# Patient Record
Sex: Female | Born: 1935 | Race: White | Hispanic: No | Marital: Married | Smoking: Never smoker
Health system: Southern US, Community
[De-identification: ages and names within clinical notes are randomized; demographics above are authoritative.]

## PROBLEM LIST (undated history)

## (undated) DIAGNOSIS — Z9289 Personal history of other medical treatment: Secondary | ICD-10-CM

## (undated) DIAGNOSIS — N907 Vulvar cyst: Secondary | ICD-10-CM

## (undated) DIAGNOSIS — M1990 Unspecified osteoarthritis, unspecified site: Secondary | ICD-10-CM

## (undated) DIAGNOSIS — I10 Essential (primary) hypertension: Secondary | ICD-10-CM

## (undated) DIAGNOSIS — K219 Gastro-esophageal reflux disease without esophagitis: Secondary | ICD-10-CM

## (undated) DIAGNOSIS — Q248 Other specified congenital malformations of heart: Secondary | ICD-10-CM

## (undated) DIAGNOSIS — I447 Left bundle-branch block, unspecified: Secondary | ICD-10-CM

## (undated) DIAGNOSIS — I2510 Atherosclerotic heart disease of native coronary artery without angina pectoris: Secondary | ICD-10-CM

## (undated) DIAGNOSIS — I340 Nonrheumatic mitral (valve) insufficiency: Secondary | ICD-10-CM

## (undated) DIAGNOSIS — E785 Hyperlipidemia, unspecified: Secondary | ICD-10-CM

## (undated) DIAGNOSIS — M8580 Other specified disorders of bone density and structure, unspecified site: Secondary | ICD-10-CM

## (undated) DIAGNOSIS — I509 Heart failure, unspecified: Secondary | ICD-10-CM

## (undated) DIAGNOSIS — R0989 Other specified symptoms and signs involving the circulatory and respiratory systems: Secondary | ICD-10-CM

## (undated) DIAGNOSIS — I739 Peripheral vascular disease, unspecified: Secondary | ICD-10-CM

## (undated) DIAGNOSIS — I517 Cardiomegaly: Secondary | ICD-10-CM

## (undated) DIAGNOSIS — Z9889 Other specified postprocedural states: Secondary | ICD-10-CM

## (undated) DIAGNOSIS — Z86010 Personal history of colonic polyps: Secondary | ICD-10-CM

## (undated) DIAGNOSIS — C4492 Squamous cell carcinoma of skin, unspecified: Secondary | ICD-10-CM

## (undated) DIAGNOSIS — I4891 Unspecified atrial fibrillation: Secondary | ICD-10-CM

## (undated) DIAGNOSIS — G2581 Restless legs syndrome: Secondary | ICD-10-CM

## (undated) DIAGNOSIS — M810 Age-related osteoporosis without current pathological fracture: Secondary | ICD-10-CM

## (undated) HISTORY — DX: Atherosclerotic heart disease of native coronary artery without angina pectoris: I25.10

## (undated) HISTORY — DX: Cardiomegaly: I51.7

## (undated) HISTORY — DX: Other specified congenital malformations of heart: Q24.8

## (undated) HISTORY — PX: GLAUCOMA SURGERY: SHX656

## (undated) HISTORY — DX: Peripheral vascular disease, unspecified: I73.9

## (undated) HISTORY — DX: Unspecified osteoarthritis, unspecified site: M19.90

## (undated) HISTORY — DX: Age-related osteoporosis without current pathological fracture: M81.0

## (undated) HISTORY — DX: Restless legs syndrome: G25.81

## (undated) HISTORY — PX: OTHER SURGICAL HISTORY: SHX169

## (undated) HISTORY — DX: Other specified postprocedural states: Z98.890

## (undated) HISTORY — DX: Personal history of other medical treatment: Z92.89

## (undated) HISTORY — DX: Unspecified atrial fibrillation: I48.91

## (undated) HISTORY — DX: Left bundle-branch block, unspecified: I44.7

## (undated) HISTORY — DX: Nonrheumatic mitral (valve) insufficiency: I34.0

## (undated) HISTORY — PX: CATARACT EXTRACTION: SUR2

## (undated) HISTORY — PX: PARS PLANA VITRECTOMY W/ REPAIR OF MACULAR HOLE: SHX2170

## (undated) HISTORY — DX: Vulvar cyst: N90.7

## (undated) HISTORY — DX: Gastro-esophageal reflux disease without esophagitis: K21.9

## (undated) HISTORY — DX: Hyperlipidemia, unspecified: E78.5

## (undated) HISTORY — DX: Personal history of colonic polyps: Z86.010

## (undated) HISTORY — DX: Other specified symptoms and signs involving the circulatory and respiratory systems: R09.89

## (undated) HISTORY — DX: Essential (primary) hypertension: I10

## (undated) HISTORY — DX: Squamous cell carcinoma of skin, unspecified: C44.92

## (undated) HISTORY — PX: TONSILLECTOMY: SUR1361

## (undated) HISTORY — DX: Other specified disorders of bone density and structure, unspecified site: M85.80

---

## 1997-06-11 ENCOUNTER — Other Ambulatory Visit: Admission: RE | Admit: 1997-06-11 | Discharge: 1997-06-11 | Payer: Self-pay | Admitting: *Deleted

## 1997-06-18 ENCOUNTER — Other Ambulatory Visit: Admission: RE | Admit: 1997-06-18 | Discharge: 1997-06-18 | Payer: Self-pay | Admitting: *Deleted

## 1998-07-05 ENCOUNTER — Other Ambulatory Visit: Admission: RE | Admit: 1998-07-05 | Discharge: 1998-07-05 | Payer: Self-pay | Admitting: *Deleted

## 1999-07-09 ENCOUNTER — Other Ambulatory Visit: Admission: RE | Admit: 1999-07-09 | Discharge: 1999-07-09 | Payer: Self-pay | Admitting: *Deleted

## 1999-07-16 ENCOUNTER — Encounter: Payer: Self-pay | Admitting: *Deleted

## 1999-07-16 ENCOUNTER — Encounter: Admission: RE | Admit: 1999-07-16 | Discharge: 1999-07-16 | Payer: Self-pay | Admitting: *Deleted

## 2000-06-11 ENCOUNTER — Other Ambulatory Visit: Admission: RE | Admit: 2000-06-11 | Discharge: 2000-06-11 | Payer: Self-pay | Admitting: Internal Medicine

## 2002-03-27 ENCOUNTER — Ambulatory Visit (HOSPITAL_COMMUNITY): Admission: RE | Admit: 2002-03-27 | Discharge: 2002-03-28 | Payer: Self-pay | Admitting: Ophthalmology

## 2002-04-03 ENCOUNTER — Ambulatory Visit (HOSPITAL_COMMUNITY): Admission: RE | Admit: 2002-04-03 | Discharge: 2002-04-04 | Payer: Self-pay | Admitting: Ophthalmology

## 2002-04-17 ENCOUNTER — Ambulatory Visit (HOSPITAL_COMMUNITY): Admission: RE | Admit: 2002-04-17 | Discharge: 2002-04-17 | Payer: Self-pay | Admitting: Ophthalmology

## 2003-06-29 ENCOUNTER — Other Ambulatory Visit: Admission: RE | Admit: 2003-06-29 | Discharge: 2003-06-29 | Payer: Self-pay | Admitting: Internal Medicine

## 2004-06-30 ENCOUNTER — Ambulatory Visit: Payer: Self-pay | Admitting: Internal Medicine

## 2004-06-30 ENCOUNTER — Other Ambulatory Visit: Admission: RE | Admit: 2004-06-30 | Discharge: 2004-06-30 | Payer: Self-pay | Admitting: Internal Medicine

## 2004-09-25 ENCOUNTER — Ambulatory Visit: Payer: Self-pay | Admitting: Internal Medicine

## 2004-12-17 ENCOUNTER — Ambulatory Visit: Payer: Self-pay | Admitting: Internal Medicine

## 2005-05-14 ENCOUNTER — Ambulatory Visit: Payer: Self-pay | Admitting: Family Medicine

## 2005-05-21 ENCOUNTER — Ambulatory Visit: Payer: Self-pay | Admitting: Internal Medicine

## 2005-07-02 ENCOUNTER — Ambulatory Visit: Payer: Self-pay | Admitting: Internal Medicine

## 2005-12-10 HISTORY — PX: OTHER SURGICAL HISTORY: SHX169

## 2005-12-21 ENCOUNTER — Ambulatory Visit: Payer: Self-pay | Admitting: Internal Medicine

## 2005-12-23 ENCOUNTER — Ambulatory Visit: Payer: Self-pay

## 2005-12-23 ENCOUNTER — Encounter: Payer: Self-pay | Admitting: Cardiology

## 2006-01-07 ENCOUNTER — Ambulatory Visit: Payer: Self-pay

## 2006-01-07 ENCOUNTER — Encounter: Payer: Self-pay | Admitting: Internal Medicine

## 2006-02-19 ENCOUNTER — Encounter: Payer: Self-pay | Admitting: Internal Medicine

## 2006-07-07 ENCOUNTER — Ambulatory Visit: Payer: Self-pay | Admitting: Internal Medicine

## 2006-07-07 LAB — CONVERTED CEMR LAB
ALT: 30 units/L (ref 0–40)
AST: 31 units/L (ref 0–37)
Albumin: 3.9 g/dL (ref 3.5–5.2)
Alkaline Phosphatase: 48 units/L (ref 39–117)
BUN: 16 mg/dL (ref 6–23)
Basophils Absolute: 0 10*3/uL (ref 0.0–0.1)
Basophils Relative: 0.2 % (ref 0.0–1.0)
Bilirubin, Direct: 0.1 mg/dL (ref 0.0–0.3)
CO2: 27 meq/L (ref 19–32)
Calcium: 10 mg/dL (ref 8.4–10.5)
Chloride: 106 meq/L (ref 96–112)
Cholesterol: 162 mg/dL (ref 0–200)
Creatinine, Ser: 1 mg/dL (ref 0.4–1.2)
Direct LDL: 85 mg/dL
Eosinophils Absolute: 0.1 10*3/uL (ref 0.0–0.6)
Eosinophils Relative: 1.3 % (ref 0.0–5.0)
GFR calc Af Amer: 69 mL/min
GFR calc non Af Amer: 57 mL/min
Glucose, Bld: 95 mg/dL (ref 70–99)
HCT: 41.7 % (ref 36.0–46.0)
HDL: 40 mg/dL (ref >39.0)
Hemoglobin: 14.7 g/dL (ref 12.0–15.0)
Lymphocytes Relative: 40.6 % (ref 12.0–46.0)
MCHC: 35.2 g/dL (ref 30.0–36.0)
MCV: 91.9 fL (ref 78.0–100.0)
Monocytes Absolute: 0.5 10*3/uL (ref 0.2–0.7)
Monocytes Relative: 9.3 % (ref 3.0–11.0)
Neutro Abs: 2.7 10*3/uL (ref 1.4–7.7)
Neutrophils Relative %: 48.6 % (ref 43.0–77.0)
Platelets: 162 10*3/uL (ref 150–400)
Potassium: 4 meq/L (ref 3.5–5.1)
RBC: 4.54 M/uL (ref 3.87–5.11)
RDW: 12.6 % (ref 11.5–14.6)
Sodium: 139 meq/L (ref 135–145)
TSH: 2.69 microintl units/mL (ref 0.35–5.50)
Total Bilirubin: 0.7 mg/dL (ref 0.3–1.2)
Total CHOL/HDL Ratio: 4.1
Total Protein: 6.6 g/dL (ref 6.0–8.3)
Triglycerides: 232 mg/dL (ref 0–149)
VLDL: 46 mg/dL — ABNORMAL HIGH (ref 0–40)
WBC: 5.6 10*3/uL (ref 4.5–10.5)

## 2006-07-09 ENCOUNTER — Encounter: Payer: Self-pay | Admitting: Internal Medicine

## 2006-07-09 DIAGNOSIS — I447 Left bundle-branch block, unspecified: Secondary | ICD-10-CM

## 2006-07-09 DIAGNOSIS — I10 Essential (primary) hypertension: Secondary | ICD-10-CM

## 2006-07-09 DIAGNOSIS — M1990 Unspecified osteoarthritis, unspecified site: Secondary | ICD-10-CM | POA: Insufficient documentation

## 2006-07-09 DIAGNOSIS — Z860100 Personal history of colon polyps, unspecified: Secondary | ICD-10-CM | POA: Insufficient documentation

## 2006-07-09 DIAGNOSIS — M810 Age-related osteoporosis without current pathological fracture: Secondary | ICD-10-CM | POA: Insufficient documentation

## 2006-07-09 DIAGNOSIS — E785 Hyperlipidemia, unspecified: Secondary | ICD-10-CM

## 2006-07-09 HISTORY — DX: Essential (primary) hypertension: I10

## 2006-07-09 HISTORY — DX: Unspecified osteoarthritis, unspecified site: M19.90

## 2006-07-09 HISTORY — DX: Age-related osteoporosis without current pathological fracture: M81.0

## 2006-07-09 HISTORY — DX: Left bundle-branch block, unspecified: I44.7

## 2006-07-09 HISTORY — DX: Personal history of colon polyps, unspecified: Z86.0100

## 2006-07-09 HISTORY — DX: Hyperlipidemia, unspecified: E78.5

## 2006-07-09 HISTORY — DX: Personal history of colonic polyps: Z86.010

## 2006-12-30 ENCOUNTER — Ambulatory Visit: Payer: Self-pay | Admitting: Internal Medicine

## 2007-07-07 ENCOUNTER — Encounter: Payer: Self-pay | Admitting: Internal Medicine

## 2007-07-08 ENCOUNTER — Ambulatory Visit: Payer: Self-pay | Admitting: Internal Medicine

## 2007-07-08 DIAGNOSIS — R0989 Other specified symptoms and signs involving the circulatory and respiratory systems: Secondary | ICD-10-CM

## 2007-07-08 HISTORY — DX: Other specified symptoms and signs involving the circulatory and respiratory systems: R09.89

## 2007-07-08 LAB — CONVERTED CEMR LAB
ALT: 25 units/L (ref 0–35)
AST: 26 units/L (ref 0–37)
Albumin: 4 g/dL (ref 3.5–5.2)
Alkaline Phosphatase: 60 units/L (ref 39–117)
BUN: 18 mg/dL (ref 6–23)
Basophils Absolute: 0 10*3/uL (ref 0.0–0.1)
Basophils Relative: 0.5 % (ref 0.0–1.0)
Bilirubin, Direct: 0.1 mg/dL (ref 0.0–0.3)
CO2: 27 meq/L (ref 19–32)
Calcium: 10.3 mg/dL (ref 8.4–10.5)
Chloride: 107 meq/L (ref 96–112)
Cholesterol: 178 mg/dL (ref 0–200)
Creatinine, Ser: 1.1 mg/dL (ref 0.4–1.2)
Direct LDL: 94.7 mg/dL
Eosinophils Absolute: 0.1 10*3/uL (ref 0.0–0.7)
Eosinophils Relative: 1.2 % (ref 0.0–5.0)
GFR calc Af Amer: 61 mL/min
GFR calc non Af Amer: 51 mL/min
Glucose, Bld: 92 mg/dL (ref 70–99)
HCT: 44.8 % (ref 36.0–46.0)
HDL: 34.1 mg/dL — ABNORMAL LOW (ref >39.0)
Hemoglobin: 15 g/dL (ref 12.0–15.0)
Lymphocytes Relative: 35.8 % (ref 12.0–46.0)
MCHC: 33.4 g/dL (ref 30.0–36.0)
MCV: 93.4 fL (ref 78.0–100.0)
Monocytes Absolute: 0.5 10*3/uL (ref 0.1–1.0)
Monocytes Relative: 8.4 % (ref 3.0–12.0)
Neutro Abs: 3.3 10*3/uL (ref 1.4–7.7)
Neutrophils Relative %: 54.1 % (ref 43.0–77.0)
Platelets: 161 10*3/uL (ref 150–400)
Potassium: 4 meq/L (ref 3.5–5.1)
RBC: 4.8 M/uL (ref 3.87–5.11)
RDW: 12.4 % (ref 11.5–14.6)
Sodium: 142 meq/L (ref 135–145)
TSH: 2.54 microintl units/mL (ref 0.35–5.50)
Total Bilirubin: 0.9 mg/dL (ref 0.3–1.2)
Total CHOL/HDL Ratio: 5.2
Total Protein: 6.8 g/dL (ref 6.0–8.3)
Triglycerides: 304 mg/dL (ref 0–149)
VLDL: 61 mg/dL — ABNORMAL HIGH (ref 0–40)
WBC: 6 10*3/uL (ref 4.5–10.5)

## 2007-07-11 ENCOUNTER — Encounter: Payer: Self-pay | Admitting: Internal Medicine

## 2007-07-11 ENCOUNTER — Ambulatory Visit: Payer: Self-pay | Admitting: Internal Medicine

## 2007-07-13 ENCOUNTER — Ambulatory Visit: Payer: Self-pay

## 2007-07-13 ENCOUNTER — Encounter: Payer: Self-pay | Admitting: Internal Medicine

## 2007-07-21 ENCOUNTER — Ambulatory Visit: Payer: Self-pay | Admitting: Internal Medicine

## 2007-07-21 ENCOUNTER — Telehealth (INDEPENDENT_AMBULATORY_CARE_PROVIDER_SITE_OTHER): Payer: Self-pay

## 2007-07-21 DIAGNOSIS — M25519 Pain in unspecified shoulder: Secondary | ICD-10-CM | POA: Insufficient documentation

## 2007-08-04 ENCOUNTER — Telehealth: Payer: Self-pay | Admitting: Internal Medicine

## 2007-11-15 ENCOUNTER — Ambulatory Visit: Payer: Self-pay | Admitting: Internal Medicine

## 2007-11-15 DIAGNOSIS — J069 Acute upper respiratory infection, unspecified: Secondary | ICD-10-CM | POA: Insufficient documentation

## 2008-04-12 ENCOUNTER — Encounter: Payer: Self-pay | Admitting: Internal Medicine

## 2008-05-14 ENCOUNTER — Ambulatory Visit: Payer: Self-pay | Admitting: Internal Medicine

## 2008-05-15 ENCOUNTER — Telehealth: Payer: Self-pay | Admitting: Internal Medicine

## 2008-05-21 ENCOUNTER — Encounter: Payer: Self-pay | Admitting: Internal Medicine

## 2008-07-12 ENCOUNTER — Encounter: Payer: Self-pay | Admitting: Internal Medicine

## 2008-07-13 ENCOUNTER — Ambulatory Visit: Payer: Self-pay | Admitting: Internal Medicine

## 2008-07-13 DIAGNOSIS — K219 Gastro-esophageal reflux disease without esophagitis: Secondary | ICD-10-CM

## 2008-07-13 HISTORY — DX: Gastro-esophageal reflux disease without esophagitis: K21.9

## 2008-07-13 LAB — CONVERTED CEMR LAB
ALT: 27 units/L (ref 0–35)
AST: 27 units/L (ref 0–37)
Albumin: 3.9 g/dL (ref 3.5–5.2)
Alkaline Phosphatase: 61 units/L (ref 39–117)
BUN: 21 mg/dL (ref 6–23)
Basophils Absolute: 0 10*3/uL (ref 0.0–0.1)
Basophils Relative: 0.2 % (ref 0.0–3.0)
Bilirubin, Direct: 0.2 mg/dL (ref 0.0–0.3)
CO2: 29 meq/L (ref 19–32)
Calcium: 9.9 mg/dL (ref 8.4–10.5)
Chloride: 110 meq/L (ref 96–112)
Cholesterol: 163 mg/dL (ref 0–200)
Creatinine, Ser: 0.9 mg/dL (ref 0.4–1.2)
Direct LDL: 102.4 mg/dL
Eosinophils Absolute: 0.1 10*3/uL (ref 0.0–0.7)
Eosinophils Relative: 1.5 % (ref 0.0–5.0)
GFR calc non Af Amer: 63.71 mL/min (ref >60)
Glucose, Bld: 107 mg/dL — ABNORMAL HIGH (ref 70–99)
HCT: 40.7 % (ref 36.0–46.0)
HDL: 36.9 mg/dL — ABNORMAL LOW (ref >39.00)
Hemoglobin: 14.4 g/dL (ref 12.0–15.0)
Lymphocytes Relative: 33.1 % (ref 12.0–46.0)
Lymphs Abs: 1.8 10*3/uL (ref 0.7–4.0)
MCHC: 35.3 g/dL (ref 30.0–36.0)
MCV: 89.9 fL (ref 78.0–100.0)
Monocytes Absolute: 0.6 10*3/uL (ref 0.1–1.0)
Monocytes Relative: 10.6 % (ref 3.0–12.0)
Neutro Abs: 2.8 10*3/uL (ref 1.4–7.7)
Neutrophils Relative %: 54.6 % (ref 43.0–77.0)
Platelets: 132 10*3/uL — ABNORMAL LOW (ref 150.0–400.0)
Potassium: 3.8 meq/L (ref 3.5–5.1)
RBC: 4.53 M/uL (ref 3.87–5.11)
RDW: 12.9 % (ref 11.5–14.6)
Sodium: 142 meq/L (ref 135–145)
TSH: 2.11 microintl units/mL (ref 0.35–5.50)
Total Bilirubin: 0.9 mg/dL (ref 0.3–1.2)
Total CHOL/HDL Ratio: 4
Total Protein: 6.6 g/dL (ref 6.0–8.3)
Triglycerides: 211 mg/dL — ABNORMAL HIGH (ref 0.0–149.0)
VLDL: 42.2 mg/dL — ABNORMAL HIGH (ref 0.0–40.0)
WBC: 5.3 10*3/uL (ref 4.5–10.5)

## 2008-07-18 ENCOUNTER — Encounter: Payer: Self-pay | Admitting: Internal Medicine

## 2008-07-18 ENCOUNTER — Ambulatory Visit: Payer: Self-pay

## 2008-10-17 ENCOUNTER — Ambulatory Visit: Payer: Self-pay | Admitting: Internal Medicine

## 2008-10-23 ENCOUNTER — Telehealth (INDEPENDENT_AMBULATORY_CARE_PROVIDER_SITE_OTHER): Payer: Self-pay

## 2008-10-24 ENCOUNTER — Encounter: Payer: Self-pay | Admitting: Cardiology

## 2008-10-24 ENCOUNTER — Ambulatory Visit: Payer: Self-pay

## 2008-11-15 ENCOUNTER — Encounter (INDEPENDENT_AMBULATORY_CARE_PROVIDER_SITE_OTHER): Payer: Self-pay

## 2009-01-17 ENCOUNTER — Ambulatory Visit: Payer: Self-pay | Admitting: Internal Medicine

## 2009-04-19 ENCOUNTER — Encounter: Payer: Self-pay | Admitting: Internal Medicine

## 2009-06-24 ENCOUNTER — Telehealth: Payer: Self-pay | Admitting: Internal Medicine

## 2009-06-24 ENCOUNTER — Ambulatory Visit: Payer: Self-pay | Admitting: Internal Medicine

## 2009-06-24 DIAGNOSIS — I4892 Unspecified atrial flutter: Secondary | ICD-10-CM

## 2009-06-25 ENCOUNTER — Telehealth: Payer: Self-pay

## 2009-07-09 ENCOUNTER — Telehealth: Payer: Self-pay | Admitting: Internal Medicine

## 2009-07-11 ENCOUNTER — Encounter: Payer: Self-pay | Admitting: Internal Medicine

## 2009-07-12 ENCOUNTER — Telehealth (INDEPENDENT_AMBULATORY_CARE_PROVIDER_SITE_OTHER): Payer: Self-pay | Admitting: *Deleted

## 2009-07-12 ENCOUNTER — Ambulatory Visit: Payer: Self-pay

## 2009-07-12 ENCOUNTER — Encounter: Payer: Self-pay | Admitting: Internal Medicine

## 2009-07-15 ENCOUNTER — Ambulatory Visit: Payer: Self-pay | Admitting: Internal Medicine

## 2009-07-15 LAB — CONVERTED CEMR LAB
Cholesterol: 179 mg/dL (ref 0–200)
Direct LDL: 101.7 mg/dL
HDL: 40.8 mg/dL (ref >39.00)
Total CHOL/HDL Ratio: 4
Triglycerides: 251 mg/dL — ABNORMAL HIGH (ref 0.0–149.0)
VLDL: 50.2 mg/dL — ABNORMAL HIGH (ref 0.0–40.0)

## 2009-08-05 ENCOUNTER — Ambulatory Visit: Payer: Self-pay | Admitting: Internal Medicine

## 2009-08-06 ENCOUNTER — Telehealth: Payer: Self-pay | Admitting: Internal Medicine

## 2009-08-09 ENCOUNTER — Telehealth: Payer: Self-pay | Admitting: Internal Medicine

## 2009-08-14 ENCOUNTER — Encounter (INDEPENDENT_AMBULATORY_CARE_PROVIDER_SITE_OTHER): Payer: Self-pay | Admitting: *Deleted

## 2009-08-26 ENCOUNTER — Telehealth: Payer: Self-pay | Admitting: Internal Medicine

## 2009-08-28 ENCOUNTER — Telehealth: Payer: Self-pay

## 2009-09-30 ENCOUNTER — Ambulatory Visit: Payer: Self-pay | Admitting: Internal Medicine

## 2009-09-30 DIAGNOSIS — I4891 Unspecified atrial fibrillation: Secondary | ICD-10-CM

## 2009-09-30 LAB — CONVERTED CEMR LAB
BUN: 20 mg/dL (ref 6–23)
Basophils Absolute: 0 10*3/uL (ref 0.0–0.1)
Basophils Relative: 0.3 % (ref 0.0–3.0)
CO2: 28 meq/L (ref 19–32)
Calcium: 9.9 mg/dL (ref 8.4–10.5)
Chloride: 102 meq/L (ref 96–112)
Creatinine, Ser: 1.1 mg/dL (ref 0.4–1.2)
Eosinophils Absolute: 0.1 10*3/uL (ref 0.0–0.7)
Eosinophils Relative: 1.2 % (ref 0.0–5.0)
GFR calc non Af Amer: 53.17 mL/min (ref >60)
Glucose, Bld: 80 mg/dL (ref 70–99)
HCT: 43.5 % (ref 36.0–46.0)
Hemoglobin: 15.1 g/dL — ABNORMAL HIGH (ref 12.0–15.0)
Lymphocytes Relative: 36.9 % (ref 12.0–46.0)
Lymphs Abs: 2.7 10*3/uL (ref 0.7–4.0)
MCHC: 34.6 g/dL (ref 30.0–36.0)
MCV: 90.6 fL (ref 78.0–100.0)
Monocytes Absolute: 0.6 10*3/uL (ref 0.1–1.0)
Monocytes Relative: 8.6 % (ref 3.0–12.0)
Neutro Abs: 3.9 10*3/uL (ref 1.4–7.7)
Neutrophils Relative %: 53 % (ref 43.0–77.0)
Platelets: 154 10*3/uL (ref 150.0–400.0)
Potassium: 4.6 meq/L (ref 3.5–5.1)
RBC: 4.8 M/uL (ref 3.87–5.11)
RDW: 14.2 % (ref 11.5–14.6)
Sodium: 139 meq/L (ref 135–145)
WBC: 7.3 10*3/uL (ref 4.5–10.5)

## 2009-10-08 ENCOUNTER — Telehealth: Payer: Self-pay | Admitting: Internal Medicine

## 2009-11-04 ENCOUNTER — Ambulatory Visit: Payer: Self-pay | Admitting: Internal Medicine

## 2009-11-05 ENCOUNTER — Encounter: Payer: Self-pay | Admitting: Internal Medicine

## 2009-11-07 ENCOUNTER — Telehealth: Payer: Self-pay | Admitting: Internal Medicine

## 2009-11-14 ENCOUNTER — Ambulatory Visit: Payer: Self-pay | Admitting: Internal Medicine

## 2009-11-14 DIAGNOSIS — R079 Chest pain, unspecified: Secondary | ICD-10-CM

## 2009-11-14 LAB — CONVERTED CEMR LAB
BUN: 23 mg/dL (ref 6–23)
Basophils Absolute: 0 10*3/uL (ref 0.0–0.1)
Basophils Relative: 0.3 % (ref 0.0–3.0)
CO2: 26 meq/L (ref 19–32)
Calcium: 9.5 mg/dL (ref 8.4–10.5)
Chloride: 102 meq/L (ref 96–112)
Creatinine, Ser: 1.1 mg/dL (ref 0.4–1.2)
Eosinophils Absolute: 0.1 10*3/uL (ref 0.0–0.7)
Eosinophils Relative: 1.6 % (ref 0.0–5.0)
GFR calc non Af Amer: 52.01 mL/min (ref >60)
Glucose, Bld: 150 mg/dL — ABNORMAL HIGH (ref 70–99)
HCT: 41.2 % (ref 36.0–46.0)
Hemoglobin: 14.3 g/dL (ref 12.0–15.0)
INR: 1 (ref 0.8–1.0)
Lymphocytes Relative: 32.9 % (ref 12.0–46.0)
Lymphs Abs: 2 10*3/uL (ref 0.7–4.0)
MCHC: 34.8 g/dL (ref 30.0–36.0)
MCV: 92.2 fL (ref 78.0–100.0)
Monocytes Absolute: 0.5 10*3/uL (ref 0.1–1.0)
Monocytes Relative: 7.5 % (ref 3.0–12.0)
Neutro Abs: 3.6 10*3/uL (ref 1.4–7.7)
Neutrophils Relative %: 57.7 % (ref 43.0–77.0)
Platelets: 156 10*3/uL (ref 150.0–400.0)
Potassium: 3.7 meq/L (ref 3.5–5.1)
Prothrombin Time: 11.2 s (ref 9.7–11.8)
RBC: 4.47 M/uL (ref 3.87–5.11)
RDW: 14 % (ref 11.5–14.6)
Sodium: 137 meq/L (ref 135–145)
WBC: 6.2 10*3/uL (ref 4.5–10.5)
aPTT: 42.7 s — ABNORMAL HIGH (ref 21.7–28.8)

## 2009-11-15 ENCOUNTER — Encounter: Payer: Self-pay | Admitting: Internal Medicine

## 2009-11-15 ENCOUNTER — Encounter (INDEPENDENT_AMBULATORY_CARE_PROVIDER_SITE_OTHER): Payer: Self-pay | Admitting: *Deleted

## 2009-11-15 ENCOUNTER — Ambulatory Visit: Payer: Self-pay

## 2009-11-15 ENCOUNTER — Ambulatory Visit (HOSPITAL_COMMUNITY): Admission: RE | Admit: 2009-11-15 | Discharge: 2009-11-15 | Payer: Self-pay | Admitting: Internal Medicine

## 2009-11-15 ENCOUNTER — Ambulatory Visit: Payer: Self-pay | Admitting: Internal Medicine

## 2009-11-18 ENCOUNTER — Ambulatory Visit: Payer: Self-pay | Admitting: Cardiology

## 2009-11-18 ENCOUNTER — Inpatient Hospital Stay (HOSPITAL_BASED_OUTPATIENT_CLINIC_OR_DEPARTMENT_OTHER): Admission: RE | Admit: 2009-11-18 | Discharge: 2009-11-18 | Payer: Self-pay | Admitting: Cardiology

## 2009-11-29 ENCOUNTER — Ambulatory Visit: Payer: Self-pay | Admitting: Internal Medicine

## 2009-11-29 DIAGNOSIS — R0602 Shortness of breath: Secondary | ICD-10-CM | POA: Insufficient documentation

## 2009-12-09 ENCOUNTER — Telehealth: Payer: Self-pay | Admitting: Internal Medicine

## 2010-03-03 ENCOUNTER — Encounter: Payer: Self-pay | Admitting: Internal Medicine

## 2010-03-03 ENCOUNTER — Ambulatory Visit: Admit: 2010-03-03 | Payer: Self-pay | Admitting: Internal Medicine

## 2010-03-03 ENCOUNTER — Ambulatory Visit
Admission: RE | Admit: 2010-03-03 | Discharge: 2010-03-03 | Payer: Self-pay | Source: Home / Self Care | Attending: Internal Medicine | Admitting: Internal Medicine

## 2010-03-03 ENCOUNTER — Other Ambulatory Visit: Payer: Self-pay | Admitting: Internal Medicine

## 2010-03-03 LAB — BASIC METABOLIC PANEL
BUN: 21 mg/dL (ref 6–23)
CO2: 26 mEq/L (ref 19–32)
Calcium: 9.9 mg/dL (ref 8.4–10.5)
Chloride: 102 mEq/L (ref 96–112)
Creatinine, Ser: 1.1 mg/dL (ref 0.4–1.2)
GFR: 49.3 mL/min — ABNORMAL LOW (ref >60.00)
Glucose, Bld: 62 mg/dL — ABNORMAL LOW (ref 70–99)
Potassium: 4.6 mEq/L (ref 3.5–5.1)
Sodium: 135 mEq/L (ref 135–145)

## 2010-03-11 NOTE — Progress Notes (Signed)
Summary: cardiology referral  Phone Note Call from Patient   Caller: Patient Call For: Gordy Savers  MD Summary of Call: Pt is asking for a Cardiology referral. [phone] Initial call taken by: [facility] CMA,  August 06, 2009 2:28 PM  Follow-up for Phone Call        ok Follow-up by: Gordy Savers  MD,  August 06, 2009 5:14 PM  Additional Follow-up for Phone Call Additional follow up Details #1::        pt advised we will call with referral. Additional Follow-up by: [facility] CMA,  August 07, 2009 8:20 AM

## 2010-03-11 NOTE — Letter (Signed)
Summary: Colonoscopy-Changed to Office Visit Letter  [facility]  [address] [place], [place] [address]   Phone: [phone]  Fax: [phone]      August 14, 2009 MRN: [id number]   DELOROS BERETTA [address] [place], [place]  [address]   Dear Ms. Dada,   According to our records, it is time for you to schedule a Colonoscopy. However, after reviewing your medical record, I feel that an office visit would be most appropriate to more completely evaluate you and determine your need for a repeat procedure.  Please call [phone] (option #2) at your convenience to schedule an office visit. If you have any questions, concerns, or feel that this letter is in error, we would appreciate your call.   Sincerely,  Hedwig Morton. Juanda Chance, M.D.  [facility] [phone]

## 2010-03-11 NOTE — Miscellaneous (Signed)
Summary: Flu Shot/[facility]  Flu Shot/[facility]   Imported By: Maryln Gottron 11/07/2009 09:21:13  _____________________________________________________________________  External Attachment:    Type:   Image     Comment:   External Document

## 2010-03-11 NOTE — Progress Notes (Signed)
Summary: med questions   Phone Note Call from Patient   Caller: Patient Reason for Call: Talk to Nurse Summary of Call: pt has med questions-pls call [phone] Initial call taken by: Glynda Jaeger,  October 08, 2009 9:21 AM  Follow-up for Phone Call        pt had  numerous questions in relation to her medications including if she needed to stop Pradaxa for her teeth to be cleaned and when she has her colonoscopy.  Instrusted pt that she doesn't need to stop Pradaxa for teeth cleaning.  Nor is there any indication for SBE.  She will need to let her GI MD know that she is on Pradaxa so that discontinuation can be discuss prior to the procedure if necessary.  Pt states understanding. Follow-up by: Charolotte Capuchin, RN,  October 08, 2009 10:40 AM

## 2010-03-11 NOTE — Progress Notes (Signed)
Summary: clarification om med  Phone Note Call from Patient   Summary of Call: metoprolol is 25 mg and she has been taking 1/2 of that - got rx for 50mg  and is unsure weither to take 1 or 1/2 ? please clarify and mail new rx  if needed. KIK Initial call taken by: Duard Brady LPN,  Jun 24, 2009 2:33 PM  Follow-up for Phone Call        spoke with pt - per Dr. Amador Cunas - take 1/2 50mg  two times a day - stop hctz - verbalized understanding. KIK Follow-up by: Duard Brady LPN,  Jun 24, 2009 5:12 PM    continue 1/2 of  50 mg two times a day and stop HCTZ

## 2010-03-11 NOTE — Assessment & Plan Note (Signed)
Summary: elevated BP/dm   Vital Signs:  Patient profile:   77 year old female Weight:      147 pounds Temp:     98.0 degrees F oral BP sitting:   180 / 80  (left arm) Cuff size:   regular  Vitals Entered By: Duard Brady LPN (July 15, 1608 9:30 AM) CC: c/o elevated BP - on her home machine 218/78      has sono - carotid on Friday , elevated then Is Patient Diabetic? No   CC:  c/o elevated BP - on her home machine 218/78      has sono - carotid on Friday  and elevated then.  History of Present Illness: 41 -year-old patient who is seen today for follow-up of her hypertension.  She was seen in the ED for atrial fibrillation and placed on metoprolol; hydrochlorothiazide was discontinued at that time.  Three days ago.  She was seen for a follow up carotid artery.  Doppler study and had quite elevated systolic readings.  This has persisted through the weekend.  When seen here last visit, but pressure was stable.  She is also noticed some mild left ankle edema.  Preventive Screening-Counseling & Management  Alcohol-Tobacco     Smoking Status: never  Allergies: 1)  Sulfamethoxazole (Sulfamethoxazole)  Past History:  Past Medical History: Reviewed history from 06/24/2009 and no changes required. Colonic polyps, hx of Hyperlipidemia Hypertension Osteoporosis LBBB DJD GERD right ICA stenosis history of atrial flutter May 2011  Physical Exam  General:  Well-developed,well-nourished,in no acute distress; alert,appropriate and cooperative throughout examination; 180/70 Neck:  No deformities, masses, or tenderness noted. Lungs:  Normal respiratory effort, chest expands symmetrically. Lungs are clear to auscultation, no crackles or wheezes. Heart:  Normal rate and regular rhythm. S1 and S2 normal without gallop, murmur, click, rub or other extra sounds. Extremities:  1+ left pedal edema.  1+ left pedal edema.     Impression & Recommendations:  Problem # 1:  ATRIAL FLUTTER  (ICD-427.32)  Her updated medication list for this problem includes:    Aspirin 325 Mg Tabs (Aspirin) .Marland Kitchen... Take 1 tablet by mouth once a day    Metoprolol Tartrate 50 Mg Tabs (Metoprolol tartrate) .Marland Kitchen... 1/2  by mouth two times a day remains in a regular rhythm  Her updated medication list for this problem includes:    Aspirin 325 Mg Tabs (Aspirin) .Marland Kitchen... Take 1 tablet by mouth once a day    Metoprolol Tartrate 50 Mg Tabs (Metoprolol tartrate) .Marland Kitchen... 1/2  by mouth two times a day  Problem # 2:  HYPERTENSION (ICD-401.9)  Her updated medication list for this problem includes:    Metoprolol Tartrate 50 Mg Tabs (Metoprolol tartrate) .Marland Kitchen... 1/2  by mouth two times a day    Hydrochlorothiazide 25 Mg Tabs (Hydrochlorothiazide) ..... One daily    Her updated medication list for this problem includes:    Metoprolol Tartrate 50 Mg Tabs (Metoprolol tartrate) .Marland Kitchen... 1/2  by mouth two times a day    Hydrochlorothiazide 25 Mg Tabs (Hydrochlorothiazide) ..... One daily  Orders: Prescription Created Electronically [phone])  Problem # 3:  HYPERLIPIDEMIA (ICD-272.4)  Her updated medication list for this problem includes:    Zocor 40 Mg Tabs (Simvastatin) .Marland Kitchen... Take 1 tablet by mouth at bedtime    Her updated medication list for this problem includes:    Zocor 40 Mg Tabs (Simvastatin) .Marland Kitchen... Take 1 tablet by mouth at bedtime  Orders: Venipuncture (40981) TLB-Lipid Panel (80061-LIPID)  Complete Medication List: 1)  Aspirin 325 Mg Tabs (Aspirin) .... Take 1 tablet by mouth once a day 2)  Zocor 40 Mg Tabs (Simvastatin) .... Take 1 tablet by mouth at bedtime 3)  Alendronate Sodium 70 Mg Tabs (Alendronate sodium) .... One weekly 4)  Meloxicam 7.5 Mg Tabs (Meloxicam) .... One twice daily 5)  Omeprazole 20 Mg Cpdr (Omeprazole) .... One twice daily 6)  Nitrostat 0.4 Mg Subl (Nitroglycerin) .... One sublingually as needed for chest pain 7)  Metoprolol Tartrate 50 Mg Tabs (Metoprolol tartrate) .... 1/2   by mouth two times a day 8)  Hydrochlorothiazide 25 Mg Tabs (Hydrochlorothiazide) .... One daily  Patient Instructions: 1)  Please schedule a follow-up appointment in 1 month. 2)  Limit your Sodium (Salt). 3)  It is important that you exercise regularly at least 20 minutes 5 times a week. If you develop chest pain, have severe difficulty breathing, or feel very tired , stop exercising immediately and seek medical attention. 4)  Check your Blood Pressure regularly. If it is above: 160/90  you should make an appointment. Prescriptions: HYDROCHLOROTHIAZIDE 25 MG TABS (HYDROCHLOROTHIAZIDE) one daily  #90 x 6   Entered and Authorized by:   Gordy Savers  MD   Signed by:   Gordy Savers  MD on 07/15/2009   Method used:   Print then Give to Patient   RxID:   [id number] HYDROCHLOROTHIAZIDE 25 MG TABS (HYDROCHLOROTHIAZIDE) one daily  #90 x 6   Entered and Authorized by:   Gordy Savers  MD   Signed by:   Gordy Savers  MD on 07/15/2009   Method used:   Electronically to        [facility]* (mail-order)             ,          Ph: [phone]       Fax: [phone]   RxID:   [id number]

## 2010-03-11 NOTE — Assessment & Plan Note (Signed)
Summary: follow up from ER re: AFIB and some chest pain/cjr   Vital Signs:  Patient profile:   77 year old female Weight:      143 pounds Temp:     98.0 degrees F oral BP sitting:   130 / 60  (right arm) Cuff size:   regular  Vitals Entered By: Duard Brady LPN (Jun 24, 2009 11:56 AM) CC: f/u - was seen in ER this weekend for AFib - in the outer banks Is Patient Diabetic? No   CC:  f/u - was seen in ER this weekend for AFib - in the outer banks.  History of Present Illness: 77 year old patient who is seen today following an ER visit for atrial flutter.  This occurred while she [place] the beach.  Apparently, she was treated with adenosine with conversion to a normal sinus rhythm.  She has felt well since the ER visit without any recurrent palpitations, weakness, or shortness of breath.  There is been no chest pain.  She was discharged on metoprolol 25 mg one half tablet twice daily.  Presently she is taking aspirin 325 daily  Allergies: 1)  Sulfamethoxazole (Sulfamethoxazole)  Past History:  Past Medical History: Colonic polyps, hx of Hyperlipidemia Hypertension Osteoporosis LBBB DJD GERD right ICA stenosis history of atrial flutter May 2011  Family History: Reviewed history from 07/13/2008 and no changes required. father died age 63 brother after disease mother died age 40, coronary artery disease, MI, history colon cancer  Four brothers two sisters  Positive  for lung cancer on her artery disease, COPD, diabetes; CAD  Social History: Reviewed history from 07/08/2007 and no changes required. Married Never Smoked  Review of Systems  The patient denies anorexia, fever, weight loss, weight gain, vision loss, decreased hearing, hoarseness, chest pain, syncope, dyspnea on exertion, peripheral edema, prolonged cough, headaches, hemoptysis, abdominal pain, melena, hematochezia, severe indigestion/heartburn, hematuria, incontinence, genital sores, muscle  weakness, suspicious skin lesions, transient blindness, difficulty walking, depression, unusual weight change, abnormal bleeding, enlarged lymph nodes, angioedema, and breast masses.    Physical Exam  General:  Well-developed,well-nourished,in no acute distress; alert,appropriate and cooperative throughout examination; blood pressure 130/60 Head:  Normocephalic and atraumatic without obvious abnormalities. No apparent alopecia or balding. Mouth:  Oral mucosa and oropharynx without lesions or exudates.  Teeth in good repair. Neck:  No deformities, masses, or tenderness noted. Lungs:  Normal respiratory effort, chest expands symmetrically. Lungs are clear to auscultation, no crackles or wheezes. Heart:  Normal rate and regular rhythm. S1 and S2 normal without gallop, murmur, click, rub or other extra sounds. Abdomen:  Bowel sounds positive,abdomen soft and non-tender without masses, organomegaly or hernias noted. Msk:  No deformity or scoliosis noted of thoracic or lumbar spine.   Pulses:  R and L carotid,radial,femoral,dorsalis pedis and posterior tibial pulses are full and equal bilaterally   Impression & Recommendations:  Problem # 1:  ATRIAL FLUTTER (ICD-427.32)  Her updated medication list for this problem includes:    Aspirin 325 Mg Tabs (Aspirin) .Marland Kitchen... Take 1 tablet by mouth once a day    Metoprolol Tartrate 50 Mg Tabs (Metoprolol tartrate) .Marland Kitchen... 1/2  by mouth two times a day  Problem # 2:  GERD (ICD-530.81)  Her updated medication list for this problem includes:    Omeprazole 20 Mg Cpdr (Omeprazole) ..... One twice daily  Problem # 3:  BUNDLE BRANCH BLOCK, LEFT (ICD-426.3)  Problem # 4:  HYPERTENSION (ICD-401.9)  The following medications were removed from the  medication list:    Hydrochlorothiazide 25 Mg Tabs (Hydrochlorothiazide) .Marland Kitchen... Take 1 tablet by mouth once a day Her updated medication list for this problem includes:    Metoprolol Tartrate 50 Mg Tabs (Metoprolol  tartrate) .Marland Kitchen... 1/2  by mouth two times a day  Complete Medication List: 1)  Aspirin 325 Mg Tabs (Aspirin) .... Take 1 tablet by mouth once a day 2)  Zocor 40 Mg Tabs (Simvastatin) .... Take 1 tablet by mouth at bedtime 3)  Alendronate Sodium 70 Mg Tabs (Alendronate sodium) .... One weekly 4)  Meloxicam 7.5 Mg Tabs (Meloxicam) .... One twice daily 5)  Omeprazole 20 Mg Cpdr (Omeprazole) .... One twice daily 6)  Nitrostat 0.4 Mg Subl (Nitroglycerin) .... One sublingually as needed for chest pain 7)  Metoprolol Tartrate 50 Mg Tabs (Metoprolol tartrate) .... 1/2  by mouth two times a day  Other Orders: [facility] (Vascular)  Patient Instructions: 1)  Limit your Sodium (Salt) to less than 2 grams a day(slightly less than 1/2 a teaspoon) to prevent fluid retention, swelling, or worsening of symptoms. 2)  It is important that you exercise regularly at least 20 minutes 5 times a week. If you develop chest pain, have severe difficulty breathing, or feel very tired , stop exercising immediately and seek medical attention. 3)  Take an Aspirin every day. 4)  Please schedule a follow-up appointment in 3 months. Prescriptions: METOPROLOL TARTRATE 50 MG TABS (METOPROLOL TARTRATE) 1/2  by mouth two times a day  #90 x 4   Entered and Authorized by:   Gordy Savers  MD   Signed by:   Gordy Savers  MD on 06/24/2009   Method used:   Print then Give to Patient   RxID:   [id number]

## 2010-03-11 NOTE — Assessment & Plan Note (Signed)
Summary: 3 month rov./njr   Allergies: 1)  Sulfamethoxazole (Sulfamethoxazole)   Complete Medication List: 1)  Zocor 40 Mg Tabs (Simvastatin) .... Take 1 tablet by mouth at bedtime 2)  Omeprazole 20 Mg Cpdr (Omeprazole) .... One twice daily 3)  Nitrostat 0.4 Mg Subl (Nitroglycerin) .... One sublingually as needed for chest pain 4)  Hydrochlorothiazide 25 Mg Tabs (Hydrochlorothiazide) .... One daily 5)  Multivitamins Tabs (Multiple vitamin) .... Once daily 6)  Calcium Carbonate-vitamin D 600-400 Mg-unit Tabs (Calcium carbonate-vitamin d) .... Two times a day 7)  Cardizem La 120 Mg Xr24h-tab (Diltiazem hcl coated beads) .... One by mouth daily 8)  Pradaxa 150 Mg Caps (Dabigatran etexilate mesylate) .... One by mouth two times a day  Appended Document: 3 month rov./njr     Vital Signs:  Patient profile:   77 year old female Weight:      144 pounds Temp:     98.2 degrees F oral BP sitting:   150 / 70  (left arm) Cuff size:   regular  Vitals Entered By: Duard Brady LPN (November 04, 2009 12:45 PM) CC: 3 mos rov - BP still up and down , seen cardio Is Patient Diabetic? No   Primary Care Provider:  Gordy Savers  MD  CC:  3 mos rov - BP still up and down  and seen cardio.  History of Present Illness: 77 year old patient who is seen today for follow-up of hypertension, dyslipidemia, paroxysmal atrial fibrillation.  She is now on Pradaxa.   she has done quite well.  She has had some elevated systolic readings diastolics are generally less than 70.  She now is on Cardizem, as well as hydrochlorothiazide.  She denies any cardiopulmonary complaints.  No palpitations. She is complaining of arthritic pain.  Mobic has been discontinued.  As well as aspirin.  She has dyslipidemia, well controlled on simvastatin 40 mg daily  Allergies: 1)  Sulfamethoxazole (Sulfamethoxazole)  Past History:  Past Medical History: Reviewed history from 09/30/2009 and no changes  required. Colonic polyps, hx of Hyperlipidemia Hypertension Osteoporosis LBBB DJD GERD right ICA stenosis history of atrial fibrillation May 2011 peripheral vascular disease with mild bilateral subclavian artery stenosis  Family History: Reviewed history from 07/13/2008 and no changes required. father died age 82 brother after disease mother died age 20, coronary artery disease, MI, history colon cancer  Four brothers two sisters  Positive  for lung cancer on her artery disease, COPD, diabetes; CAD  Review of Systems  The patient denies anorexia, fever, weight loss, weight gain, vision loss, decreased hearing, hoarseness, chest pain, syncope, dyspnea on exertion, peripheral edema, prolonged cough, headaches, hemoptysis, abdominal pain, melena, hematochezia, severe indigestion/heartburn, hematuria, incontinence, genital sores, muscle weakness, suspicious skin lesions, transient blindness, difficulty walking, depression, unusual weight change, abnormal bleeding, enlarged lymph nodes, angioedema, and breast masses.    Physical Exam  General:  overweight-appearing.  blood pressure 160/60 Head:  Normocephalic and atraumatic without obvious abnormalities. No apparent alopecia or balding. Eyes:  No corneal or conjunctival inflammation noted. EOMI. Perrla. Funduscopic exam benign, without hemorrhages, exudates or papilledema. Vision grossly normal. Mouth:  Oral mucosa and oropharynx without lesions or exudates.  Teeth in good repair. Neck:  No deformities, masses, or tenderness noted. Lungs:  Normal respiratory effort, chest expands symmetrically. Lungs are clear to auscultation, no crackles or wheezes. Heart:  grade 3/6 systolic murmur heard diffusely Abdomen:  Bowel sounds positive,abdomen soft and non-tender without masses, organomegaly or hernias noted. Msk:  No deformity or  scoliosis noted of thoracic or lumbar spine.   Pulses:  all pedal pulses intact Skin:  Intact without  suspicious lesions or rashes Cervical Nodes:  No lymphadenopathy noted   Impression & Recommendations:  Problem # 1:  PAROXYSMAL ATRIAL FIBRILLATION (ICD-427.31)  The following medications were removed from the medication list:    Cardizem La 120 Mg Xr24h-tab (Diltiazem hcl coated beads) ..... One by mouth daily Her updated medication list for this problem includes:    Diltiazem Hcl Coated Beads 120 Mg Xr24h-cap (Diltiazem hcl coated beads) ..... One daily  Problem # 2:  HYPERTENSION (ICD-401.9)  The following medications were removed from the medication list:    Cardizem La 120 Mg Xr24h-tab (Diltiazem hcl coated beads) ..... One by mouth daily Her updated medication list for this problem includes:    Hydrochlorothiazide 25 Mg Tabs (Hydrochlorothiazide) ..... One daily    Diltiazem Hcl Coated Beads 120 Mg Xr24h-cap (Diltiazem hcl coated beads) ..... One daily  Problem # 3:  HYPERLIPIDEMIA (ICD-272.4)  The following medications were removed from the medication list:    Zocor 40 Mg Tabs (Simvastatin) .Marland Kitchen... Take 1 tablet by mouth at bedtime Her updated medication list for this problem includes:    Simvastatin 40 Mg Tabs (Simvastatin) ..... One daily  Complete Medication List: 1)  Omeprazole 20 Mg Cpdr (Omeprazole) .... One twice daily 2)  Nitrostat 0.4 Mg Subl (Nitroglycerin) .... One sublingually as needed for chest pain 3)  Hydrochlorothiazide 25 Mg Tabs (Hydrochlorothiazide) .... One daily 4)  Multivitamins Tabs (Multiple vitamin) .... Once daily 5)  Calcium Carbonate-vitamin D 600-400 Mg-unit Tabs (Calcium carbonate-vitamin d) .... Two times a day 6)  Pradaxa 150 Mg Caps (Dabigatran etexilate mesylate) .... One by mouth two times a day 7)  Simvastatin 40 Mg Tabs (Simvastatin) .... One daily 8)  Diltiazem Hcl Coated Beads 120 Mg Xr24h-cap (Diltiazem hcl coated beads) .... One daily 9)  Tramadol Hcl 50 Mg Tabs (Tramadol hcl) .... One every 6 hours as needed for pain  Patient  Instructions: 1)  Please schedule a follow-up appointment in 4 months. 2)  Limit your Sodium (Salt). 3)  It is important that you exercise regularly at least 20 minutes 5 times a week. If you develop chest pain, have severe difficulty breathing, or feel very tired , stop exercising immediately and seek medical attention. Prescriptions: TRAMADOL HCL 50 MG TABS (TRAMADOL HCL) one every 6 hours as needed for pain  #90 x 4   Entered and Authorized by:   Gordy Savers  MD   Signed by:   Gordy Savers  MD on 11/04/2009   Method used:   Faxed to ...       [facility] (mail-order)             , [place]         Ph: [phone]       Fax: [phone]   RxID:   [id number] SIMVASTATIN 40 MG TABS (SIMVASTATIN) one daily  #90 x 6   Entered and Authorized by:   Gordy Savers  MD   Signed by:   Gordy Savers  MD on 11/04/2009   Method used:   Faxed to ...       [facility] (mail-order)             , [place]         Ph: [phone]       Fax: [phone]   RxID:   [id number] HYDROCHLOROTHIAZIDE 25 MG  TABS (HYDROCHLOROTHIAZIDE) one daily  #90 x 6   Entered and Authorized by:   Gordy Savers  MD   Signed by:   Gordy Savers  MD on 11/04/2009   Method used:   Faxed to ...       [facility] (mail-order)             , [place]         Ph: [phone]       Fax: [phone]   RxID:   [id number] TRAMADOL HCL 50 MG TABS (TRAMADOL HCL) one every 6 hours as needed for pain  #90 x 4   Entered and Authorized by:   Gordy Savers  MD   Signed by:   Gordy Savers  MD on 11/04/2009   Method used:   Electronically to        [facility]  [address] [facility]* (retail)       [address]       [place], [place]  [address]       Ph: [phone] or [phone]       Fax: [phone]   RxID:   [id number] DILTIAZEM HCL COATED BEADS 120 MG XR24H-CAP (DILTIAZEM HCL COATED BEADS) one daily  #90 x 4   Entered and Authorized by:   Gordy Savers  MD    Signed by:   Gordy Savers  MD on 11/04/2009   Method used:   Electronically to        [facility]  [address] [facility]* (retail)       [address]       [place], [place]  [address]       Ph: [phone] or [phone]       Fax: [phone]   RxID:   [id number] SIMVASTATIN 40 MG TABS (SIMVASTATIN) one daily  #90 x 6   Entered and Authorized by:   Gordy Savers  MD   Signed by:   Gordy Savers  MD on 11/04/2009   Method used:   Electronically to        [facility]  [address] [facility]* (retail)       [address]       [place], [place]  [address]       Ph: [phone] or [phone]       Fax: [phone]   RxID:   [id number] PRADAXA 150 MG CAPS (DABIGATRAN ETEXILATE MESYLATE) one by mouth two times a day  #180 x 6   Entered and Authorized by:   Gordy Savers  MD   Signed by:   Gordy Savers  MD on 11/04/2009   Method used:   Electronically to        [facility]  [address] [facility]* (retail)       [address]       [place], [place]  [address]       Ph: [phone] or [phone]       Fax: [phone]   RxID:   [id number] HYDROCHLOROTHIAZIDE 25 MG TABS (HYDROCHLOROTHIAZIDE) one daily  #90 x 6   Entered and Authorized by:   Gordy Savers  MD   Signed by:   Gordy Savers  MD on 11/04/2009   Method used:   Electronically  to        [facility]* (retail)       [address]       [place], [place]  [address]       Ph: [phone] or [phone]       Fax: [phone]   RxID:   [id number] NITROSTAT 0.4 MG SUBL (NITROGLYCERIN) one sublingually as needed for chest pain  #1 bottle x 2   Entered and Authorized by:   Gordy Savers  MD   Signed by:   Gordy Savers  MD on 11/04/2009   Method used:   Electronically to        [facility]  [address] [facility]* (retail)       [address]       [place], [place]  [address]       Ph: [phone] or [phone]       Fax: [phone]   RxID:   [id number] OMEPRAZOLE 20 MG CPDR (OMEPRAZOLE) one twice daily  #180 x 4   Entered and Authorized by:   Gordy Savers  MD   Signed by:   Gordy Savers  MD on 11/04/2009   Method used:   Electronically to        [facility]  [address] [facility]* (retail)       [address]       [place], [place]  [address]       Ph: [phone] or [phone]       Fax: [phone]   RxID:   [id number]

## 2010-03-11 NOTE — Assessment & Plan Note (Signed)
Summary: 1 month fup//ccm/pt rsc/cjr   Vital Signs:  Patient profile:   78 year old female Weight:      145 pounds Temp:     98.0 degrees F oral BP sitting:   140 / 80  (left arm) Cuff size:   regular  Vitals Entered By: Duard Brady LPN (August 05, 2009 10:37 AM) CC: 1 mos rov - fatigue Is Patient Diabetic? No   CC:  1 mos rov - fatigue.  History of Present Illness:  77 year old patient who is seen today for follow-up.  She has a [place]ys[place];  this has not re-occurred.  She has hypertension, controlled.  Diuretic therapy, and also metoprolol.  She has rare chest pain with questionable benefit from nitroglycerin.  She has normal LV function and low risk Cardiolite stress tests.  She complains of some mild fatigue but in general, does quite well.  Blood pressure readings are slightly elevated periodically especially the systolic readings.  She has dyslipidemia, controlled with simvastatin  Preventive Screening-Counseling & Management  Alcohol-Tobacco     Smoking Status: never  Allergies: 1)  Sulfamethoxazole (Sulfamethoxazole)  Past History:  Past Medical History: Colonic polyps, hx of Hyperlipidemia Hypertension Osteoporosis LBBB DJD GERD right ICA stenosis history of atrial flutter May 2011 peripheral vascular disease with mild bilateral subclavian artery stenosis  Past Surgical History: two para two, abortus zero Tonsillectomy 2-D echocardiogram November 2007 revealed normal LV function and size.  No wall motion at around his moderate mitral annular calcification.  Elevated LVOT gradient of 22 thought secondary to narrow outflow tract aortic valve appeared to open well low risk Cardiolite  November 2007, 10-2008 colonoscopy 2005 carotid artery   Doppler evaluation and 2009,  2010, 6-11  Review of Systems  The patient denies anorexia, fever, weight loss, weight gain, vision loss, decreased hearing,  hoarseness, chest pain, syncope, dyspnea on exertion, peripheral edema, prolonged cough, headaches, hemoptysis, abdominal pain, melena, hematochezia, severe indigestion/heartburn, hematuria, incontinence, genital sores, muscle weakness, suspicious skin lesions, transient blindness, difficulty walking, depression, unusual weight change, abnormal bleeding, enlarged lymph nodes, angioedema, and breast masses.    Physical Exam  General:  Well-developed,well-nourished,in no acute distress; alert,appropriate and cooperative throughout examination; blood pressure 160/80 bilaterally Head:  Normocephalic and atraumatic without obvious abnormalities. No apparent alopecia or balding. Mouth:  Oral mucosa and oropharynx without lesions or exudates.  Teeth in good repair. Neck:  No deformities, masses, or tenderness noted. Lungs:  Normal respiratory effort, chest expands symmetrically. Lungs are clear to auscultation, no crackles or wheezes. Heart:  grade 3/6 systolic murmur Abdomen:  Bowel sounds positive,abdomen soft and non-tender without masses, organomegaly or hernias noted. Pulses:  right dorsalis pedis pulse absent Skin:  Intact without suspicious lesions or rashes   Impression & Recommendations:  Problem # 1:  ATRIAL FLUTTER (ICD-427.32)  Her updated medication list for this problem includes:    Aspirin 325 Mg Tabs (Aspirin) .Marland Kitchen... Take 1 tablet by mouth once a day    Metoprolol Tartrate 50 Mg Tabs (Metoprolol tartrate) ..... One   by mouth two times a day  Her updated medication list for this problem includes:    Aspirin 325 Mg Tabs (Aspirin) .Marland Kitchen... Take 1 tablet by mouth once a day    Metoprolol Tartrate 50 Mg Tabs (Metoprolol tartrate) ..... One   by mouth two times a day  Problem # 2:  HYPERTENSION (ICD-401.9)  Her updated medication list for this problem includes:  Metoprolol Tartrate 50 Mg Tabs (Metoprolol tartrate) ..... One   by mouth two times a day    Hydrochlorothiazide 25 Mg Tabs  (Hydrochlorothiazide) ..... One daily  Her updated medication list for this problem includes:    Metoprolol Tartrate 50 Mg Tabs (Metoprolol tartrate) ..... One   by mouth two times a day    Hydrochlorothiazide 25 Mg Tabs (Hydrochlorothiazide) ..... One daily  Problem # 3:  HYPERLIPIDEMIA (ICD-272.4)  Her updated medication list for this problem includes:    Zocor 40 Mg Tabs (Simvastatin) .Marland Kitchen... Take 1 tablet by mouth at bedtime  Her updated medication list for this problem includes:    Zocor 40 Mg Tabs (Simvastatin) .Marland Kitchen... Take 1 tablet by mouth at bedtime  Complete Medication List: 1)  Aspirin 325 Mg Tabs (Aspirin) .... Take 1 tablet by mouth once a day 2)  Zocor 40 Mg Tabs (Simvastatin) .... Take 1 tablet by mouth at bedtime 3)  Meloxicam 7.5 Mg Tabs (Meloxicam) .... One twice daily 4)  Omeprazole 20 Mg Cpdr (Omeprazole) .... One twice daily 5)  Nitrostat 0.4 Mg Subl (Nitroglycerin) .... One sublingually as needed for chest pain 6)  Metoprolol Tartrate 50 Mg Tabs (Metoprolol tartrate) .... One   by mouth two times a day 7)  Hydrochlorothiazide 25 Mg Tabs (Hydrochlorothiazide) .... One daily  Patient Instructions: 1)  Please schedule a follow-up appointment in 3 months. 2)  Limit your Sodium (Salt). 3)  It is important that you exercise regularly at least 20 minutes 5 times a week. If you develop chest pain, have severe difficulty breathing, or feel very tired , stop exercising immediately and seek medical attention.

## 2010-03-11 NOTE — Assessment & Plan Note (Signed)
Summary: 6wk f/u sl  Medications Added SIMVASTATIN 40 MG TABS (SIMVASTATIN) Take one tablet by mouth daily at bedtime LISINOPRIL 5 MG TABS (LISINOPRIL) Take one tablet by mouth daily        Visit Type:  Follow-up Primary Provider:  Gordy Savers  MD   History of Present Illness: Ms Schlereth is a pleasant 77 yo WF with a h/o paroxysmal atrial fibrillation and chronic LBBB who presents today for cardiology followup.  Since last being seen in my clinic, she has developed exertional symptoms of shortness of breath and chest discomfort.  She reports that with moderate activity, she has chest pain which radiates into her neck.  This SOB resolves with resting.  Episodes have become more frequent over the past few weeks.  She states that while at the beach last week, she had a similar episode while walking with her daughter.  Her daughter noticed that the patient appeared breathless and uncomfortable.  Her symptoms resolved with slNTG. She denies having any furhter episodes of atrial fibrillation.  She has also tolerated pradaxa without difficulty.  She had a stress test a year ago for evaluation of LBBB which was normal at that time.  Current Medications (verified): 1)  Omeprazole 20 Mg Cpdr (Omeprazole) .... One Twice Daily 2)  Nitrostat 0.4 Mg Subl (Nitroglycerin) .... One Sublingually As Needed For Chest Pain 3)  Hydrochlorothiazide 25 Mg Tabs (Hydrochlorothiazide) .... One Daily 4)  Multivitamins   Tabs (Multiple Vitamin) .... Once Daily 5)  Calcium Carbonate-Vitamin D 600-400 Mg-Unit Tabs (Calcium Carbonate-Vitamin D) .... Two Times A Day 6)  Pradaxa 150 Mg Caps (Dabigatran Etexilate Mesylate) .... One By Mouth Two Times A Day 7)  Diltiazem Hcl Coated Beads 120 Mg Xr24h-Cap (Diltiazem Hcl Coated Beads) .... One Daily 8)  Tramadol Hcl 50 Mg Tabs (Tramadol Hcl) .... One Every 6 Hours As Needed For Pain 9)  Simvastatin 40 Mg Tabs (Simvastatin) .... Take One Tablet By Mouth Daily At  Bedtime  Allergies: 1)  Sulfamethoxazole (Sulfamethoxazole)  Past History:  Past Medical History: Reviewed history from 09/30/2009 and no changes required. Colonic polyps, hx of Hyperlipidemia Hypertension Osteoporosis LBBB DJD GERD right ICA stenosis history of atrial fibrillation May 2011 peripheral vascular disease with mild bilateral subclavian artery stenosis  Past Surgical History: Reviewed history from 08/05/2009 and no changes required. two para two, abortus zero Tonsillectomy 2-D echocardiogram November 2007 revealed normal LV function and size.  No wall motion at around his moderate mitral annular calcification.  Elevated LVOT gradient of 22 thought secondary to narrow outflow tract aortic valve appeared to open well low risk Cardiolite  November 2007, 10-2008 colonoscopy 2005 carotid artery   Doppler evaluation and 2009,  2010, 6-11  Social History: Reviewed history from 09/30/2009 and no changes required. Married and lives in [place].   Never Smoked Etoh- none Drugs- denies  Review of Systems       All systems are reviewed and negative except as listed in the HPI.   Vital Signs:  Patient profile:   77 year old female Height:      61 inches Weight:      145 pounds BMI:     27.50 Pulse rate:   82 / minute BP sitting:   160 / 60  (left arm)  Vitals Entered By: Laurance Flatten CMA (November 14, 2009 1:44 PM)  Physical Exam  General:  overweight-appearing.  blood pressure 160/60 Head:  normocephalic and atraumatic Eyes:  PERRLA/EOM intact; conjunctiva and lids  normal. Mouth:  Teeth, gums and palate normal. Oral mucosa normal. Neck:  Neck supple, no JVD. No masses, thyromegaly or abnormal cervical nodes. Lungs:  Clear bilaterally to auscultation and percussion. Heart:  RRR, wide S2 split,  3/6 SEM LUSB which is early peaking Abdomen:  Bowel sounds positive; abdomen soft and non-tender without masses, organomegaly, or hernias noted. No  hepatosplenomegaly. Msk:  Back normal, normal gait. Muscle strength and tone normal. Extremities:  No clubbing or cyanosis. Neurologic:  Alert and oriented x 3. Skin:  Intact without lesions or rashes. Cervical Nodes:  no significant adenopathy Psych:  Normal affect.   Nuclear Study  Procedure date:  10/24/2008  Findings:      QPS  Raw Data Images:  Acuisition technically good; normal left ventricular size. Stress Images:  There is normal uptake in all areas. Rest Images:  Normal homogeneous uptake in all areas of the myocardium. Subtraction (SDS):  No evidence of ischemia. Transient Ischemic Dilatation:  1.19  (Normal <1.22)  Lung/Heart Ratio:  .28  (Normal <0.45)  Quantitative Gated Spect Images  QGS EDV:  78 ml QGS ESV:  26 ml QGS EF:  67 % QGS cine images:  Normal wall motion.   Overall Impression   Exercise Capacity: Adenosine study with no exercise. ECG Impression: Uninterpretable due to baseline LBBB. Overall Impression: There is no sign of scar or ischemia.    Signed by Ferman Hamming, MD, [facility] on 10/24/2008 at 3:11 PM  Echocardiogram  Procedure date:  12/23/2005  Findings:        SUMMARY   -  Overall left ventricular systolic function was normal. Left         ventricular ejection fraction was estimated , range being 55         % to 60 %. There were no left ventricular regional wall         motion abnormalities. Left ventricular wall thickness was         mildly to moderately increased. There was mild flattening of         the interventricular septum during systole and diastole.   -  Aortic valve thickness was mildly increased. There was mild         aortic valvular regurgitation. The mean transaortic valve         gradient was 16.9 mmHg.   -  There was mild to moderate mitral annular calcification. There         was mild mitral valvular regurgitation.   -  The left atrium was mild to moderately dilated.   -  The estimated peak pulmonary  artery systolic pressure was mildly         increased.   -  There was a small pericardial effusion.   IMPRESSIONS   -  Elevated LVOT gradient (mean gradient 22 mmHg and peak velocity         of 3.1 m/s) noted most likely secondary to narrow outflow         tract; aortic valve appears to open well.    ---------------------------------------------------------------   Prepared and Electronically Authenticated by   Olga Millers M.D.      EKG  Procedure date:  11/14/2009  Findings:      sinus rhythm,  LBBB (old)  Impression & Recommendations:  Problem # 1:  PAROXYSMAL ATRIAL FIBRILLATION (ICD-427.31) controlled she is tolerating pradaxa well we will hold pradaxa for 48 hours prior to left heart catheterization and likely resume pradaxa 24-48  hours post cath  Problem # 2:  CHEST PAIN, EXERTIONAL (ICD-786.50) The patient has progressive exertional chest pain which is very worrisome for coronary disease.  I have restarted ASA today. Given my high pretest probability for CAD, I will plan to proceed directly to cath at this time.  I low risk myoview would not reduce my suspicion and would still likely result in cath. She is instructed to come to the ER over the weekend if she has chest pain at rest or chest pain not relieved with slNTG.  She also has a loud systolic murmur and history of aortic outflow tract gradient by echo in 2007.  We will repeat her echo to evaluate her aortic outflow tract gradient as another cause for her symptoms.  Problem # 3:  BUNDLE BRANCH BLOCK, LEFT (ICD-426.3) stable  Problem # 4:  HYPERTENSION (ICD-401.9) stable  Other Orders: Echocardiogram (Echo) TLB-BMP (Basic Metabolic Panel-BMET) (80048-METABOL) TLB-CBC Platelet - w/Differential (85025-CBCD) TLB-PT (Protime) (85610-PTP) TLB-PTT (85730-PTTL)  Patient Instructions: 1)  Your physician has requested that you have a cardiac catheterization.  Cardiac catheterization is used to diagnose and/or  treat various heart conditions. Doctors may recommend this procedure for a number of different reasons. The most common reason is to evaluate chest pain. Chest pain can be a symptom of coronary artery disease (CAD), and cardiac catheterization can show whether plaque is narrowing or blocking your heart's arteries. This procedure is also used to evaluate the valves, as well as measure the blood flow and oxygen levels in different parts of your heart.  For further information please visit [email or web address]  Please follow instruction sheet, as given. 2)  Your physician has requested that you have an echocardiogram.  Echocardiography is a painless test that uses sound waves to create images of your heart. It provides your doctor with information about the size and shape of your heart and how well your heart's chambers and valves are working.  This procedure takes approximately one hour. There are no restrictions for this procedure. 3)  Your physician has recommended you make the following change in your medication: start Lisinopril 5mg  daily and aspirin 81mg  daily 4)  Your physician recommends that you schedule a follow-up appointment in: 6 weeks with DrAllred Prescriptions: LISINOPRIL 5 MG TABS (LISINOPRIL) Take one tablet by mouth daily  #30 x 6   Entered by:   Dennis Bast, RN, BSN   Authorized by:   Hillis Range, MD   Signed by:   Dennis Bast, RN, BSN on 11/14/2009   Method used:   Electronically to        [id number] (retail)       [address]       [place], [place]  [address]       Ph: [phone] or [phone]       Fax: [phone]   RxID:   [id number]

## 2010-03-11 NOTE — Progress Notes (Signed)
Summary: B/P going up and down   Phone Note Call from Patient Call back at Home Phone [phone]   Caller: Patient Summary of Call: B/P going up and down Initial call taken by: Judie Grieve,  August 26, 2009 4:26 PM  Follow-up for Phone Call        spoke with pt  She hasn't seen Dr Johney Frame yet.  Dr Kirtland Bouchard has been following her BP and will need to continue until see is seen by Dr Johney Frame.  I will lollk at our schedule and see if we can move her appointment up.  Pt aware and will call her back Dennis Bast, RN, BSN  August 26, 2009 5:53 PM spoke with pt she has been keeping a log of BP for [place] to look over on the 22nd.  Her feeling sluggish has gotten some better. Dennis Bast, RN, BSN  September 25, 2009 3:08 PM

## 2010-03-11 NOTE — Progress Notes (Signed)
   Phone Note From Other Clinic   Request: Call Patient Summary of Call: Patient in for routine follow-up of Carotid arteries.  BP initially was 200/80 mmHg.  After exam, it was down to 180/76 mmHg.  Patient is concerned because it has never been that high.  She denies symptoms.    rov today

## 2010-03-11 NOTE — Assessment & Plan Note (Signed)
Summary: eph/jml  Medications Added DILTIAZEM HCL COATED BEADS 120 MG XR24H-CAP (DILTIAZEM HCL COATED BEADS) take 2 tablets in the morning and one in the pm        Visit Type:  Follow-up Primary Provider:  Gordy Savers  MD   History of Present Illness: Ms Krell presents today for routine cardiology followup. She reports doing well since her recent catheterization.  Dr Shirlee Latch found nonobstructive CAD, though she did have a LVOT gradient due to LVH/ vigorous LV contraction.  She denies further sympotms of afib.  She continues to have occasional exertional SOB.  She has tolerated pradaxa without difficulty.    Current Medications (verified): 1)  Omeprazole 20 Mg Cpdr (Omeprazole) .... One Twice Daily 2)  Nitrostat 0.4 Mg Subl (Nitroglycerin) .... One Sublingually As Needed For Chest Pain 3)  Hydrochlorothiazide 25 Mg Tabs (Hydrochlorothiazide) .... One Daily 4)  Multivitamins   Tabs (Multiple Vitamin) .... Once Daily 5)  Calcium Carbonate-Vitamin D 600-400 Mg-Unit Tabs (Calcium Carbonate-Vitamin D) .... Two Times A Day 6)  Pradaxa 150 Mg Caps (Dabigatran Etexilate Mesylate) .... One By Mouth Two Times A Day 7)  Diltiazem Hcl Coated Beads 120 Mg Xr24h-Cap (Diltiazem Hcl Coated Beads) .... Two Times A Day 8)  Tramadol Hcl 50 Mg Tabs (Tramadol Hcl) .... One Every 6 Hours As Needed For Pain 9)  Simvastatin 40 Mg Tabs (Simvastatin) .... Take One Tablet By Mouth Daily At Bedtime 10)  Lisinopril 5 Mg Tabs (Lisinopril) .... Take One Tablet By Mouth Daily  Allergies: 1)  Sulfamethoxazole (Sulfamethoxazole)  Past History:  Past Medical History: Colonic polyps, hx of Hyperlipidemia Hypertension Osteoporosis LBBB DJD GERD right ICA stenosis history of atrial fibrillation May 2011 peripheral vascular disease with mild bilateral subclavian artery stenosis Mid LV gradient due to vigorous LV contraction minimal nonobstructive CAD by cath 10/11  Past Surgical History: two para  two, abortus zero Tonsillectomy 2-D echocardiogram November 2007 revealed normal LV function and size.  No wall motion at around his moderate mitral annular calcification.  Elevated LVOT gradient of 22 thought secondary to narrow outflow tract aortic valve appeared to open well low risk Cardiolite  November 2007, 10-2008 colonoscopy 2005 carotid artery   Doppler evaluation and 2009,  2010, 6-11 cath 10/11  Social History: Reviewed history from 09/30/2009 and no changes required. Married and lives in [place].   Never Smoked Etoh- none Drugs- denies  Review of Systems       All systems are reviewed and negative except as listed in the HPI.   Vital Signs:  Patient profile:   77 year old female Height:      61 inches Weight:      146 pounds BMI:     27.69 Pulse rate:   64 / minute BP sitting:   158 / 60  (left arm)  Vitals Entered By: Laurance Flatten CMA (November 29, 2009 2:12 PM)  Physical Exam  General:  overweight-appearing.   Head:  normocephalic and atraumatic Eyes:  PERRLA/EOM intact; conjunctiva and lids normal. Mouth:  Teeth, gums and palate normal. Oral mucosa normal. Neck:  Neck supple, no JVD. No masses, thyromegaly or abnormal cervical nodes. Lungs:  Clear bilaterally to auscultation and percussion. Heart:  RRR, wide S2 split,  3/6 SEM LUSB which is early peaking Abdomen:  Bowel sounds positive; abdomen soft and non-tender without masses, organomegaly, or hernias noted. No hepatosplenomegaly. Msk:  Back normal, normal gait. Muscle strength and tone normal. Pulses:  all pedal pulses intact Extremities:  No clubbing or cyanosis. Neurologic:  Alert and oriented x 3. Skin:  Intact without lesions or rashes.   Impression & Recommendations:  Problem # 1:  PAROXYSMAL ATRIAL FIBRILLATION (ICD-427.31) maintaining sinus rhythm continue pradaxa for stroke prevention  Problem # 2:  BUNDLE BRANCH BLOCK, LEFT (ICD-426.3) stable  Problem # 3:  SHORTNESS OF BREATH  (ICD-786.05) likely due to LV gradient we will increase cardizem to 240mg  qam and 120mg  qpm today. We will further titrate cardizem as needed  Problem # 4:  HYPERTENSION (ICD-401.9) above goal increase cardizem consider increasing lisinopril upon return  Patient Instructions: 1)  Your physician recommends that you schedule a follow-up appointment in: 3 months with Dr Johney Frame 2)  Your physician has recommended you make the following change in your medication: increase Diltiazem to 120mg  2 in the morning and one in the pm Prescriptions: DILTIAZEM HCL COATED BEADS 120 MG XR24H-CAP (DILTIAZEM HCL COATED BEADS) take 2 tablets in the morning and one in the pm  #270 x 3   Entered by:   Dennis Bast, RN, BSN   Authorized by:   Hillis Range, MD   Signed by:   Dennis Bast, RN, BSN on 11/29/2009   Method used:   Faxed to ...       [facility] (mail-order)             , [place]         Ph: [phone]       Fax: [phone]   RxID:   [id number] LISINOPRIL 5 MG TABS (LISINOPRIL) Take one tablet by mouth daily  #30 x 3   Entered by:   Dennis Bast, RN, BSN   Authorized by:   Hillis Range, MD   Signed by:   Dennis Bast, RN, BSN on 11/29/2009   Method used:   Faxed to ...       [facility] (mail-order)             , [place]         Ph: [phone]       Fax: [phone]   RxID:   [id number]

## 2010-03-11 NOTE — Letter (Signed)
Summary: Cardiac Catheterization Instructions- [facility]  [facility], Main Office  [address]   [place], [place] [address]   Phone: [phone]  Fax: [phone]     11/14/2009 MRN: [id number]  Melanie Whitehead [address] [place], [place]  [address]  Dear Ms. Denno,   You are scheduled for a Cardiac Catheterization on 11/18/09 with Dr.McLean  Please arrive to the 1st floor of the [facility] and V[facility] Center at [facility] at 8:30 am on the day of your procedure. Please do not arrive before 6:30 a.m. Call the Heart and Vascular Center at [phone] if you are unable to make your appointmnet. The Code to get into the parking garage under the building is 0200. Take the elevators to the 1st floor. You must have someone to drive you home. Someone must be with you for the first 24 hours after you arrive home. Please wear clothes that are easy to get on and off and wear slip-on shoes. Do not eat or drink after midnight except water with your medications that morning. Bring all your medications and current insurance cards with you.  ___ DO NOT take these medications before your procedure:Take your last dose of Pradaxa on Friday 11/15/09   ___ You may take ALL of your medications with water that morning except Pradaxa     The usual length of stay after your procedure is 2 to 3 hours. This can vary.  If you have any questions, please call the office at the number listed above.   Dennis Bast, RN, BSN

## 2010-03-11 NOTE — Miscellaneous (Signed)
Summary: Orders Update  Clinical Lists Changes  Orders: Added new Test order of Carotid Duplex (Carotid Duplex) - Signed 

## 2010-03-11 NOTE — Progress Notes (Signed)
Summary: [facility]   Phone Note From Pharmacy   Caller: [facility] Call For: dr Kirtland Bouchard  Summary of Call: pharm has question concerning simvastin [phone] opt 2 ref# [id number] please return call by 11-11-2009. Initial call taken by: Heron Sabins,  November 07, 2009 3:48 PM  Follow-up for Phone Call        please call in new order for simvastatin 20 mg daily Follow-up by: Gordy Savers  MD,  November 08, 2009 9:29 AM  Additional Follow-up for Phone Call Additional follow up Details #1::        attempt to call [facility] - no ans - no VM   new rx faxed. KIK Additional Follow-up by: Duard Brady LPN,  November 11, 2009 9:10 AM    New/Updated Medications: SIMVASTATIN 20 MG TABS (SIMVASTATIN) qd Prescriptions: SIMVASTATIN 20 MG TABS (SIMVASTATIN) qd  #90 x 4   Entered by:   Duard Brady LPN   Authorized by:   Gordy Savers  MD   Signed by:   Duard Brady LPN on 69/62/9528   Method used:   Faxed to ...       [facility]* (retail)             ,          Ph: [phone]       Fax: [phone]   RxID:   [id number]

## 2010-03-11 NOTE — Progress Notes (Signed)
Summary: refill meds   Phone Note Refill Request Call back at Home Phone [phone] Message from:  Patient on December 09, 2009 11:39 AM  Refills Requested: Medication #1:  DILTIAZEM HCL COATED BEADS 120 MG XR24H-CAP take 2 tablets in the morning and one in the pm  Medication #2:  LISINOPRIL 5 MG TABS Take one tablet by mouth daily. [facility] ref # [id number] . phone # [phone]   Method Requested: Fax to [facility] Away Pharm[facility] Initial call taken by: Lorne Skeens,  December 09, 2009 11:39 AM    Prescriptions: LISINOPRIL 5 MG TABS (LISINOPRIL) Take one tablet by mouth daily  #30 x 3   Entered by:   Laurance Flatten CMA   Authorized by:   Hillis Range, MD   Signed by:   Laurance Flatten CMA on 12/10/2009   Method used:   Electronically to        [facility] (retail)             ,          Ph: [phone]       Fax: [phone]   RxID:   [id number] DILTIAZEM HCL COATED BEADS 120 MG XR24H-CAP (DILTIAZEM HCL COATED BEADS) take 2 tablets in the morning and one in the pm  #270 x 3   Entered by:   Laurance Flatten CMA   Authorized by:   Hillis Range, MD   Signed by:   Laurance Flatten CMA on 12/10/2009   Method used:   Electronically to        [facility]* (retail)             ,          Ph: [phone]       Fax: [phone]   RxID:   [id number]

## 2010-03-11 NOTE — Progress Notes (Signed)
Summary: refill nitrostat , meloxicam, omprezole,zocor   Phone Note Refill Request Call back at [phone] Message from:  Patient on Jul 09, 2009 10:21 AM  Refills Requested: Medication #1:  NITROSTAT 0.4 MG SUBL one sublingually as needed for chest pain   Notes: Pt req that written (printed) Rx's be prepared and left up front for p/u.Marland KitchenMarland KitchenMarland KitchenPt adv that she would run out of med before her next appt in August 2011.  Medication #2:  OMEPRAZOLE 20 MG CPDR one twice daily   Notes: Pt req that written (printed) Rx's be prepared and left up front for p/u.Marland KitchenMarland KitchenMarland KitchenPt adv that she would run out of med before her next appt in August 2011.  Medication #3:  MELOXICAM 7.5 MG  TABS one twice daily   Notes: Pt req that written (printed) Rx's be prepared and left up front for p/u.Marland KitchenMarland KitchenMarland KitchenPt adv that she would run out of med before her next appt in August 2011.  Medication #4:  ZOCOR 40 MG TABS Take 1 tablet by mouth at bedtime   Notes: Pt req that written (printed) Rx's be prepared and left up front for p/u.Marland KitchenMarland KitchenMarland KitchenPt adv that she would run out of med before her next appt in August 2011.    Initial call taken by: Debbra Riding,  Jul 09, 2009 10:24 AM  Follow-up for Phone Call        attempt to call hm # - ans mach - LMTCB if questions- rx ready for pick up KIK Follow-up by: Duard Brady LPN,  Jul 09, 2009 11:31 AM    Prescriptions: NITROSTAT 0.4 MG SUBL (NITROGLYCERIN) one sublingually as needed for chest pain  #bottle x 2   Entered by:   Duard Brady LPN   Authorized by:   Gordy Savers  MD   Signed by:   Duard Brady LPN on 78/29/5621   Method used:   Print then Give to Patient   RxID:   [id number] OMEPRAZOLE 20 MG CPDR (OMEPRAZOLE) one twice daily  #180 x 4   Entered by:   Duard Brady LPN   Authorized by:   Gordy Savers  MD   Signed by:   Duard Brady LPN on 41/32/4401   Method used:   Print then Give to Patient   RxID:   [id number] MELOXICAM 7.5 MG   TABS (MELOXICAM) one twice daily  #50 x 6   Entered by:   Duard Brady LPN   Authorized by:   Gordy Savers  MD   Signed by:   Duard Brady LPN on 59/56/3875   Method used:   Print then Give to Patient   RxID:   [id number] ZOCOR 40 MG TABS (SIMVASTATIN) Take 1 tablet by mouth at bedtime  #90 x 4   Entered by:   Duard Brady LPN   Authorized by:   Gordy Savers  MD   Signed by:   Duard Brady LPN on 30/16/0109   Method used:   Print then Give to Patient   RxID:   [id number]

## 2010-03-11 NOTE — Progress Notes (Signed)
Summary: Elevated BP  Phone Note Call from Patient Call back at Home Phone [phone]   Caller: Patient Summary of Call: BP is going up and down, taking metoprolol 50 mg as prescribed.  Want to go out of town, should I see Dr. Kirtland Bouchard?   Initial call taken by: Trixie Dredge,  August 28, 2009 9:22 AM  Follow-up for Phone Call        spoke with pt - readings 108/54 then 181/68 , no headache , chest pain or shortness of breath . Cardio appt not until aug.22 - when elevated it is before meds - responds to meds and comes down WNL. Also discussed eleminating Na from diet - deli meats, breakfast meats, chips - salad dressings.  Follow-up by: Duard Brady LPN,  August 28, 2009 10:38 AM  Additional Follow-up for Phone Call Additional follow up Details #1::        spoke with Dr. Amador Cunas - ok to travel since no signs with elevation and BP responds to meds. Keep alppt with cardio. To see if signs and high BP return. KIK Additional Follow-up by: Duard Brady LPN,  August 28, 2009 10:40 AM

## 2010-03-11 NOTE — Miscellaneous (Signed)
Summary: flu vaccine   Clinical Lists Changes  Observations: Added new observation of FLU VAX: Historical (11/04/2009 9:16)      Immunization History:  Tetanus/Td Immunization History:    Tetanus/Td:  Td (12/30/2006)  Influenza Immunization History:    Influenza:  Historical (11/04/2009)  Pneumovax Immunization History:    Pneumovax:  Pneumovax (12/30/2006)  Zostavax History:    Zostavax # 1:  Zostavax (07/21/2007) given at [facility]

## 2010-03-11 NOTE — Assessment & Plan Note (Signed)
Summary: nep/afib/carotid bruit/htn/jm  Medications Added MULTIVITAMINS   TABS (MULTIPLE VITAMIN) once daily CALCIUM CARBONATE-VITAMIN D 600-400 MG-UNIT TABS (CALCIUM CARBONATE-VITAMIN D) two times a day CARDIZEM LA 120 MG XR24H-TAB (DILTIAZEM HCL COATED BEADS) one by mouth daily PRADAXA 150 MG CAPS (DABIGATRAN ETEXILATE MESYLATE) one by mouth two times a day        Visit Type:  Initial Consult Primary Provider:  Gordy Savers  MD   History of Present Illness: [name] is a pleasant 77 yo WF with a h/o paroxysmal atrial fibrillation and chronic LBBB who presents today for cardiology evaluation.  She reports having symptoms of palpitations and "heart pounding" with associated weakness and diaphoresis 06/18/09.  This woke her from sleep.  She went to the [facility]) as she was on vacation at that time.  She was documented to have afib with RVR and converted to sinus rhythm with IV cardizem.  She was was initiated on metoprolol and continues to take metoprolol at this time.  She takes ASA 325mg  daily but has not been taking coumadin or pradaxa.  SHe is unaware of any further episodes of arrhythmia.   She reports living an active lifestyle.  She reports "sluggishness" with metoprolol but is otherwise without complaint.  The patient denies symptoms of palpitations, chest pain, orthopnea, PND, lower extremity edema, dizziness, presyncope, syncope, or neurologic sequela.  She reports mild chest "tightness" and SOB with heavy exertion.  The patient is otherwise without complaint today.   Current Medications (verified): 1)  Aspirin 325 Mg Tabs (Aspirin) .... Take 1 Tablet By Mouth Once A Day 2)  Zocor 40 Mg Tabs (Simvastatin) .... Take 1 Tablet By Mouth At Bedtime 3)  Omeprazole 20 Mg Cpdr (Omeprazole) .... One Twice Daily 4)  Nitrostat 0.4 Mg Subl (Nitroglycerin) .... One Sublingually As Needed For Chest Pain 5)  Metoprolol Tartrate 50 Mg Tabs (Metoprolol Tartrate) ....  One   By Mouth Two Times A Day 6)  Hydrochlorothiazide 25 Mg Tabs (Hydrochlorothiazide) .... One Daily 7)  Multivitamins   Tabs (Multiple Vitamin) .... Once Daily 8)  Calcium Carbonate-Vitamin D 600-400 Mg-Unit Tabs (Calcium Carbonate-Vitamin D) .... Two Times A Day  Allergies: 1)  Sulfamethoxazole (Sulfamethoxazole)  Past History:  Past Medical History: Colonic polyps, hx of Hyperlipidemia Hypertension Osteoporosis LBBB DJD GERD right ICA stenosis history of atrial fibrillation May 2011 peripheral vascular disease with mild bilateral subclavian artery stenosis  Past Surgical History: Reviewed history from 08/05/2009 and no changes required. two para two, abortus zero Tonsillectomy 2-D echocardiogram November 2007 revealed normal LV function and size.  No wall motion at around his moderate mitral annular calcification.  Elevated LVOT gradient of 22 thought secondary to narrow outflow tract aortic valve appeared to open well low risk Cardiolite  November 2007, 10-2008 colonoscopy 2005 carotid artery   Doppler evaluation and 2009,  2010, 6-11  Family History: Reviewed history from 07/13/2008 and no changes required. father died age 53 brother after disease mother died age 27, coronary artery disease, MI, history colon cancer  Four brothers two sisters  Positive  for lung cancer on her artery disease, COPD, diabetes; CAD  Social History: Married and lives in [place].   Never Smoked Etoh- none Drugs- denies  Review of Systems       All systems are reviewed and negative except as listed in the HPI.   Vital Signs:  Patient profile:   77 year old female Height:      61 inches  Weight:      147 pounds BMI:     27.88 Pulse rate:   51 / minute BP sitting:   150 / 74  (left arm)  Vitals Entered By: Laurance Flatten CMA (September 30, 2009 11:09 AM)  Physical Exam  General:  Well developed, well nourished, in no acute distress. Head:  normocephalic and atraumatic Eyes:   PERRLA/EOM intact; conjunctiva and lids normal. Mouth:  Teeth, gums and palate normal. Oral mucosa normal. Neck:  Neck supple, no JVD. + L bruit Lungs:  Clear bilaterally to auscultation and percussion. Heart:  Non-displaced PMI, chest non-tender; regular rate and rhythm, S1, S2 without mur murs, rubs or gallops. Carotid upstroke normal, no bruit. Normal abdominal aortic size, no bruits. Femorals normal pulses, no bruits. Pedals normal pulses. No edema, no varicosities. Abdomen:  Bowel sounds positive; abdomen soft and non-tender without masses, organomegaly, or hernias noted. No hepatosplenomegaly. Msk:  Back normal, normal gait. Muscle strength and tone normal. Pulses:  pulses normal in all 4 extremities Extremities:  No clubbing or cyanosis. Neurologic:  Alert and oriented x 3. Skin:  Intact without lesions or rashes. Cervical Nodes:  no significant adenopathy Psych:  Normal affect.   EKG  Procedure date:  09/30/2009  Findings:      sinus bradycardia 50 bpm, PR 172, LBBB  Impression & Recommendations:  Problem # 1:  PAROXYSMAL ATRIAL FIBRILLATION (ICD-427.31)  The patient presents today for cardiology consultation regarding her afib.  She has been documented to have afib by ekg 06/18/09.  I have reviewed the ekg which appears to be more consistent with atrial fibrillaiton than atrial flutter.  She appears to be doing well, without further afib but reports fatigue and "sluggishness" with metoprolol. Therapeutic strategies for afib  were discussed in detail with the patient today.  As she is not tolerating metoprolol, we will stop this medicine and start cardizem CD 120mg  daily. Her CHADS2 score is 2.  I have therefore recommended either coumadin or pradaxa.  She prefers pradaxa.  We will start pradaxa 150mg  two times a day today.  We will check BMET and CBC today.  Orders: TLB-BMP (Basic Metabolic Panel-BMET) (80048-METABOL) TLB-CBC Platelet - w/Differential (85025-CBCD)  Problem  # 2:  BUNDLE BRANCH BLOCK, LEFT (ICD-426.3)  stable  The following medications were removed from the medication list:    Aspirin 325 Mg Tabs (Aspirin) .Marland Kitchen... Take 1 tablet by mouth once a day    Metoprolol Tartrate 50 Mg Tabs (Metoprolol tartrate) ..... One   by mouth two times a day Her updated medication list for this problem includes:    Nitrostat 0.4 Mg Subl (Nitroglycerin) ..... One sublingually as needed for chest pain    Cardizem La 120 Mg Xr24h-tab (Diltiazem hcl coated beads) ..... One by mouth daily  Problem # 3:  HYPERTENSION (ICD-401.9) above goal salt restriction advised we will stop metoprolol and start cardizem today. if her BP remains elevated, we will consider initiation of an ARB upon return  Patient Instructions: 1)  Your physician recommends that you schedule a follow-up appointment in: 6 weeks Dr Johney Frame 2)  Your physician has recommended you make the following change in your medication: stop Metoprolol, and aspirin, start Cardizem Cd 120mg  daily and Pradaxa 150mg  two times a day 3)  Your physician recommends that you return for lab work today Prescriptions: PRADAXA 150 MG CAPS (DABIGATRAN ETEXILATE MESYLATE) one by mouth two times a day  #60 x 6   Entered by:   Dennis Bast,  RN, BSN   Authorized by:   Hillis Range, MD   Signed by:   Dennis Bast, RN, BSN on 09/30/2009   Method used:   Electronically to        [id number] (retail)       [address]       [place], [place]  [address]       Ph: [phone] or [phone]       Fax: [phone]   RxID:   [id number] CARDIZEM LA 120 MG XR24H-TAB (DILTIAZEM HCL COATED BEADS) one by mouth daily  #30 x 11   Entered by:   Dennis Bast, RN, BSN   Authorized by:   Hillis Range, MD   Signed by:   Dennis Bast, RN, BSN on 09/30/2009   Method used:   Electronically to        [id number]* (retail)       [address]       [place], [place]  [address]       Ph: [phone] or [phone]       Fax: [phone]   RxID:   [id number]

## 2010-03-11 NOTE — Progress Notes (Signed)
Summary: [facility] has questions  Medications Added CRESTOR 5 MG TABS (ROSUVASTATIN CALCIUM) Take one tablet by mouth daily. LISINOPRIL 5 MG TABS (LISINOPRIL) Take one tablet by mouth daily       Phone Note From Pharmacy   Caller: [facility] [phone] Summary of Call:  pt on diltaziem -pharmacy has questions ref# [phone] Spoke with [facility] rep. Because pt was increased to dilt. 360 daily and the recomended daily amount for a person on sivistatin 20mg  is 240 daily,  I told the pharmasist to send the pt's diltiazem and that I would let Dr. Tillie Rung Know about the recomendations.  The Pt may need to be switched to Crestor or another statin. Laurance Flatten [facility]  December 10, 2009 3:13 PM  Initial call taken by: Glynda Jaeger,  December 09, 2009 2:54 PM  Follow-up for Phone Call        stop simvastatin and start crestor 5mg  daily Follow-up by: Hillis Range, MD,  December 16, 2009 10:48 PM    Additional Follow-up for Phone Call Additional follow up Details #2::    pt notified   Follow-up by: Laurance Flatten CMA,  December 26, 2009 2:48 PM  New/Updated Medications: CRESTOR 5 MG TABS (ROSUVASTATIN CALCIUM) Take one tablet by mouth daily. LISINOPRIL 5 MG TABS (LISINOPRIL) Take one tablet by mouth daily Prescriptions: CRESTOR 5 MG TABS (ROSUVASTATIN CALCIUM) Take one tablet by mouth daily.  #90 x 3   Entered by:   Laurance Flatten CMA   Authorized by:   Hillis Range, MD   Signed by:   Laurance Flatten CMA on 12/26/2009   Method used:   Faxed to ...       [facility] (mail-order)             , [place]         Ph: [phone]       Fax: [phone]   RxID:   [id number] LISINOPRIL 5 MG TABS (LISINOPRIL) Take one tablet by mouth daily  #90 x 3   Entered by:   Laurance Flatten CMA   Authorized by:   Hillis Range, MD   Signed by:   Laurance Flatten CMA on 12/26/2009   Method used:   Faxed to ...       [facility] (mail-order)             , [place]         Ph: [phone]       Fax: [phone]   RxID:    [id number] CRESTOR 5 MG TABS (ROSUVASTATIN CALCIUM) Take one tablet by mouth daily.  #90 x 3   Entered by:   Laurance Flatten CMA   Authorized by:   Hillis Range, MD   Signed by:   Laurance Flatten CMA on 12/26/2009   Method used:   Faxed to ...       [facility] (mail-order)             , [place]         Ph: [phone]       Fax: [phone]   RxID:   [id number]

## 2010-03-11 NOTE — Progress Notes (Signed)
Summary: BP questions  Phone Note Call from Patient   Caller: Patient Call For: Gordy Savers  MD Summary of Call: Pt is taking Metoprolol 50 mg  1/2 by mouth daily.  Feeling sluggish and weak.  BP: 160/62, 138/62.  Reviewed chart and informed pt to take Metoprolol 50 mg. one by mouth two times a day .  Will send refills to [facility]. Initial call taken by: Lynann Beaver CMA,  August 09, 2009 10:33 AM  Follow-up for Phone Call        If patient is feeling sluggish and only taking 1/2 of 50 mg, continue on that dose unless BP becomes elevated Follow-up by: Gordy Savers  MD,  August 09, 2009 12:11 PM  Additional Follow-up for Phone Call Additional follow up Details #1::        Notified pt's husband regarding Dr. Charm Rings recommendations. Additional Follow-up by: Lynann Beaver CMA,  August 09, 2009 2:59 PM    Prescriptions: METOPROLOL TARTRATE 50 MG TABS (METOPROLOL TARTRATE) one   by mouth two times a day  #180 x 1   Entered by:   Lynann Beaver CMA   Authorized by:   Gordy Savers  MD   Signed by:   Lynann Beaver CMA on 08/09/2009   Method used:   Electronically to        [facility]* (retail)             ,          Ph: [phone]       Fax: [phone]   RxID:   [id number]

## 2010-03-11 NOTE — Progress Notes (Signed)
Summary: Melanie Whitehead - med questions  Phone Note Call from Patient Call back at Home Phone [phone]   Caller: Patient Call For: Gordy Savers  MD Summary of Call: PT NEEDS MED CLARIFICATION PLEASE CALL Initial call taken by: Heron Sabins,  Jun 25, 2009 12:47 PM  Follow-up for Phone Call        called pt to discuss medication - ok to stop HCTZ per Dr. Vernon Prey orders yesterday. If she has any questions about her BP readings she is getting at home - instructed to call . KIK Follow-up by: Duard Brady LPN,  Jun 25, 2009 4:56 PM

## 2010-03-11 NOTE — Cardiovascular Report (Signed)
Summary: Pre Cath Orders   Pre Cath Orders   Imported By: Roderic Ovens 11/25/2009 11:36:46  _____________________________________________________________________  External Attachment:    Type:   Image     Comment:   External Document

## 2010-03-11 NOTE — Letter (Signed)
Summary: Outpatient Coinsurance Notice  Outpatient Coinsurance Notice   Imported By: Marylou Mccoy 11/22/2009 11:14:50  _____________________________________________________________________  External Attachment:    Type:   Image     Comment:   External Document

## 2010-03-13 NOTE — Assessment & Plan Note (Signed)
Summary: 3 month rov.sl  Medications Added LISINOPRIL 10 MG TABS (LISINOPRIL) one by mouth daily        Visit Type:  Follow-up Primary Provider:  Gordy Savers  MD   History of Present Illness: Melanie Whitehead presents today for routine cardiology followup. She reports doing well since her last visit to our clinic. With recent cath, Dr Shirlee Latch found nonobstructive CAD, though she did have a LVOT gradient due to LVH/ vigorous LV contraction.  She denies further sympotms of afib.  She continues to have occasional exertional SOB but feels that this is improving.  She has tolerated pradaxa without difficulty.  She denies CP, palpitations, presyncope, or syncope.  Current Medications (verified): 1)  Omeprazole 20 Mg Cpdr (Omeprazole) .... One Twice Daily 2)  Nitrostat 0.4 Mg Subl (Nitroglycerin) .... One Sublingually As Needed For Chest Pain 3)  Hydrochlorothiazide 25 Mg Tabs (Hydrochlorothiazide) .... One Daily 4)  Multivitamins   Tabs (Multiple Vitamin) .... Once Daily 5)  Calcium Carbonate-Vitamin D 600-400 Mg-Unit Tabs (Calcium Carbonate-Vitamin D) .... Two Times A Day 6)  Pradaxa 150 Mg Caps (Dabigatran Etexilate Mesylate) .... One By Mouth Two Times A Day 7)  Diltiazem Hcl Coated Beads 120 Mg Xr24h-Cap (Diltiazem Hcl Coated Beads) .... Take 2 Tablets in The Morning and One in The Pm 8)  Tramadol Hcl 50 Mg Tabs (Tramadol Hcl) .... One Every 6 Hours As Needed For Pain 9)  Crestor 5 Mg Tabs (Rosuvastatin Calcium) .... Take One Tablet By Mouth Daily. 10)  Lisinopril 5 Mg Tabs (Lisinopril) .... Take One Tablet By Mouth Daily  Allergies: 1)  Sulfamethoxazole (Sulfamethoxazole)  Past History:  Past Medical History: Reviewed history from 11/29/2009 and no changes required. Colonic polyps, hx of Hyperlipidemia Hypertension Osteoporosis LBBB DJD GERD right ICA stenosis history of atrial fibrillation May 2011 peripheral vascular disease with mild bilateral subclavian artery  stenosis Mid LV gradient due to vigorous LV contraction minimal nonobstructive CAD by cath 10/11  Past Surgical History: Reviewed history from 11/29/2009 and no changes required. two para two, abortus zero Tonsillectomy 2-D echocardiogram November 2007 revealed normal LV function and size.  No wall motion at around his moderate mitral annular calcification.  Elevated LVOT gradient of 22 thought secondary to narrow outflow tract aortic valve appeared to open well low risk Cardiolite  November 2007, 10-2008 colonoscopy 2005 carotid artery   Doppler evaluation and 2009,  2010, 6-11 cath 10/11  Social History: Reviewed history from 09/30/2009 and no changes required. Married and lives in [place].   Never Smoked Etoh- none Drugs- denies  Review of Systems       All systems are reviewed and negative except as listed in the HPI.   Vital Signs:  Patient profile:   77 year old female Height:      61 inches Weight:      144 pounds BMI:     27.31 Pulse rate:   52 / minute BP sitting:   158 / 68  (left arm)  Vitals Entered By: Laurance Flatten CMA (March 03, 2010 9:25 AM)  Physical Exam  General:  Well developed, well nourished, in no acute distress. Head:  normocephalic and atraumatic Eyes:  PERRLA/EOM intact; conjunctiva and lids normal. Mouth:  Teeth, gums and palate normal. Oral mucosa normal. Neck:  Neck supple, no JVD. No masses, thyromegaly or abnormal cervical nodes. Lungs:  Clear bilaterally to auscultation and percussion. Heart:  RRR, wide S2 split,  3/6 SEM LUSB which is early peaking Abdomen:  Bowel sounds positive; abdomen soft and non-tender without masses, organomegaly, or hernias noted. No hepatosplenomegaly. Msk:  Back normal, normal gait. Muscle strength and tone normal. Extremities:  No clubbing or cyanosis. Neurologic:  Alert and oriented x 3.   EKG  Procedure date:  03/03/2010  Findings:      sinus rhythm 52 bpm, PR 150, LBBB QRS 148  Impression &  Recommendations:  Problem # 1:  PAROXYSMAL ATRIAL FIBRILLATION (ICD-427.31) maintaining sinus rhythm continue pradaxa for stroke prevention we will check BMET today to make sure her dosing is correct  Problem # 2:  HYPERTENSION (ICD-401.9)  above goal increase lisinopril to 10mg  daily salt restriction  Orders: TLB-BMP (Basic Metabolic Panel-BMET) (80048-METABOL)  Problem # 3:  SHORTNESS OF BREATH (ICD-786.05) likely due to LV gradient given bradycardia, we cannot increase cardizem further at this time no changes  Problem # 4:  BUNDLE BRANCH BLOCK, LEFT (ICD-426.3)  stable  Her updated medication list for this problem includes:    Nitrostat 0.4 Mg Subl (Nitroglycerin) ..... One sublingually as needed for chest pain    Diltiazem Hcl Coated Beads 120 Mg Xr24h-cap (Diltiazem hcl coated beads) .Marland Kitchen... Take 2 tablets in the morning and one in the pm    Lisinopril 10 Mg Tabs (Lisinopril) ..... One by mouth daily  Problem # 5:  HYPERLIPIDEMIA (ICD-272.4)  stable Her updated medication list for this problem includes:    Crestor 5 Mg Tabs (Rosuvastatin calcium) .Marland Kitchen... Take one tablet by mouth daily.  Her updated medication list for this problem includes:    Crestor 5 Mg Tabs (Rosuvastatin calcium) .Marland Kitchen... Take one tablet by mouth daily.  Patient Instructions: 1)  Your physician wants you to follow-up in: 4 months with Dr Johney Frame  Bonita Quin will receive a reminder letter in the mail two months in advance. If you don't receive a letter, please call our office to schedule the follow-up appointment. 2)  Your physician has requested that you limit the intake of sodium (salt) in your diet to two grams daily. Please see [facility] handout. 3)  Your physician recommends that you return for lab work today BMP 401.1  4)  Your physician has recommended you make the following change in your medication: increase Lisinopril to 10mg  daily--take 2 of the 5mg  tablets until you run out  Prescriptions: PRADAXA 150 MG  CAPS (DABIGATRAN ETEXILATE MESYLATE) one by mouth two times a day  #180 x 3   Entered by:   Dennis Bast, RN, BSN   Authorized by:   Hillis Range, MD   Signed by:   Dennis Bast, RN, BSN on 03/03/2010   Method used:   Faxed to ...       [facility] (mail-order)             , [place]         Ph: [phone]       Fax: [phone]   RxID:   [id number] LISINOPRIL 10 MG TABS (LISINOPRIL) one by mouth daily  #90 x 3   Entered by:   Dennis Bast, RN, BSN   Authorized by:   Hillis Range, MD   Signed by:   Dennis Bast, RN, BSN on 03/03/2010   Method used:   Faxed to ...       [facility] (mail-order)             , [place]         Ph: [phone]       Fax: [phone]   RxID:  [id number]  

## 2010-05-30 ENCOUNTER — Encounter: Payer: Self-pay | Admitting: Internal Medicine

## 2010-06-06 ENCOUNTER — Encounter: Payer: Self-pay | Admitting: Internal Medicine

## 2010-06-09 ENCOUNTER — Ambulatory Visit (INDEPENDENT_AMBULATORY_CARE_PROVIDER_SITE_OTHER): Payer: Medicare Other | Admitting: Internal Medicine

## 2010-06-09 ENCOUNTER — Encounter: Payer: Self-pay | Admitting: Internal Medicine

## 2010-06-09 VITALS — BP 130/68 | HR 60 | Temp 97.9°F | Resp 16 | Ht 59.5 in | Wt 139.0 lb

## 2010-06-09 DIAGNOSIS — I10 Essential (primary) hypertension: Secondary | ICD-10-CM

## 2010-06-09 DIAGNOSIS — I4892 Unspecified atrial flutter: Secondary | ICD-10-CM

## 2010-06-09 DIAGNOSIS — M810 Age-related osteoporosis without current pathological fracture: Secondary | ICD-10-CM

## 2010-06-09 DIAGNOSIS — I4891 Unspecified atrial fibrillation: Secondary | ICD-10-CM

## 2010-06-09 DIAGNOSIS — I2510 Atherosclerotic heart disease of native coronary artery without angina pectoris: Secondary | ICD-10-CM

## 2010-06-09 DIAGNOSIS — E785 Hyperlipidemia, unspecified: Secondary | ICD-10-CM

## 2010-06-09 DIAGNOSIS — Z0000 Encounter for general adult medical examination without abnormal findings: Secondary | ICD-10-CM

## 2010-06-09 LAB — LIPID PANEL
HDL: 40.3 mg/dL (ref >39.00)
VLDL: 31.6 mg/dL (ref 0.0–40.0)

## 2010-06-09 NOTE — Progress Notes (Signed)
Subjective:    Patient ID: Melanie Whitehead, female    DOB: January 10, 1925, 77 y.o.   MRN: [id number]  HPI   History of Present Illness:  77 year old patient seen today for follow-up. She has a history of hypertension, chronic left bundle branch block. She has a history of a systolic murmur which has been evaluated. She is scheduled for follow-up carotid artery. Doppler evaluation next week due to moderate right ICA stenosis. She denies any cardiopulmonary complaints. She has had some early morning dyspepsia that resolves. She has a history of dyslipidemia, osteoporosis DJD.  Problems Prior to Update:  1) Uri (ICD-465.9)  2) Shoulder Pain, Right (ICD-719.41)  3) Carotid Bruit, Left (ICD-785.9)  4) Preventive Health Care (ICD-V70.0)  5) Bundle Branch Block, Left (ICD-426.3)  6) Degenerative Joint Disease (ICD-715.90)  7) Osteoporosis (ICD-733.00)  8) Hypertension (ICD-401.9)  9) Hyperlipidemia (ICD-272.4)  10) Colonic Polyps, Hx of (ICD-V12.72)  Medications Prior to Update:  1) Aspirin 325 Mg Tabs (Aspirin) .... Take 1 Tablet By Mouth Once A Day  2) Hydrochlorothiazide 25 Mg Tabs (Hydrochlorothiazide) .... Take 1 Tablet By Mouth Once A Day  3) Zocor 40 Mg Tabs (Simvastatin) .... Take 1 Tablet By Mouth At Bedtime  4) Alendronate Sodium 70 Mg Tabs (Alendronate Sodium) .... One Weekly  5) Meloxicam 7.5 Mg Tabs (Meloxicam) .... One Twice Daily  6) Fexmid 7.5 Mg Tabs (Cyclobenzaprine Hcl) .... One Daily  7) Hydrocodone-Homatropine 5-1.5 Mg/26ml Syrp (Hydrocodone-Homatropine) .Marland Kitchen.. 1 Teaspoon Every 6 Hours As Needed For Cough  Allergies:  1) Sulfamethoxazole (Sulfamethoxazole)  Past History:  Past Medical History:  Colonic polyps, hx of  Hyperlipidemia  Hypertension  Osteoporosis  LBBB  DJD  GERD  right ICA stenosis  Past Surgical History:  two para two, abortus zero  Tonsillectomy  2-D echocardiogram November 2007 revealed normal LV function and size. No wall motion at around his  moderate mitral annular calcification. Elevated LVOT gradient of 22 thought secondary to narrow outflow tract aortic valve appeared to open well  low risk Cardiolite November 2007  colonoscopy 2005  carotid artery Doppler evaluation and 2009, 2010  Family History:  Reviewed history from 12/30/2006 and no changes required.  father died age 64 brother after disease  mother died age 23, coronary artery disease, MI, history colon cancer  Four brothers two sisters  Positive for lung cancer on her artery disease, COPD, diabetes; CAD  Social History:  Reviewed history from 07/08/2007 and no changes required.  Married  Never Smoked   1. Risk factors, based on past  M,S,F history-  cardiovascular risk factors include hypertension and dyslipidemia. She does have a history of paroxysmal atrial fibrillation. She did have a heart catheterization performed in October of last year that revealed minimal nonobstructive coronary artery disease  2.  Physical activities: No activity restrictions  3.  Depression/mood: No history of depression or mood disorder  4.  Hearing: No deficits 5.  ADL's: Independent in all aspects of daily living  6.  Fall risk: Low  7.  Home safety: No problems identified  8.  Height weight, and visual acuity; height and weight stable no change in visual acuity  9.  Counseling: Heart healthy diet restricted salt diet also encouraged daily exercise recommended 10. Lab orders based on risk factors:  11. Referral:  Not appropriate at this time except for followup colonoscopy. She has a history of colonic polyps. Will need to hold Robaxin 5 days prior to procedure she was notified for followup colonoscopy last  fall  12. Care plan: Follow colonoscopy  13. Cognitive assessment: Alert and oriented with normal affect;  no history of cognitive dysfunction        Review of Systems     Objective:   Physical Exam  Constitutional: She is oriented to person, place, and time.  She appears well-developed and well-nourished.  HENT:  Head: Normocephalic and atraumatic.  Right Ear: External ear normal.  Left Ear: External ear normal.  Mouth/Throat: Oropharynx is clear and moist.       Impaction on the right  Eyes: Conjunctivae and EOM are normal.  Neck: Normal range of motion. Neck supple. No JVD (thank you) present. No thyromegaly present.       Faint  carotid bruits;  loud left supraclavicular bruit  Cardiovascular: Normal rate, regular rhythm and intact distal pulses.   Murmur heard.      Grade 3/6 systolic murmur lattice at the base  Pulmonary/Chest: Effort normal and breath sounds normal. She has no wheezes. She has no rales.  Abdominal: Soft. Bowel sounds are normal. She exhibits no distension and no mass. There is no tenderness. There is no rebound and no guarding.  Genitourinary: Vagina normal.  Musculoskeletal: Normal range of motion. She exhibits edema. She exhibits no tenderness.       +1 ankle and pedal edema  Neurological: She is alert and oriented to person, place, and time. She has normal reflexes. No cranial nerve deficit. She exhibits normal muscle tone. Coordination normal.  Skin: Skin is warm and dry. No rash noted.  Psychiatric: She has a normal mood and affect. Her behavior is normal.          Assessment & Plan:    Annual health assessment Hypertension stable Dyslipidemia. We'll continue Crestor we'll check a lipid panel today Paroxysmal atrial fibrillation on chronic anticoagulation  History colonic polyps. Follow colonoscopy encouraged

## 2010-06-09 NOTE — Patient Instructions (Signed)
Limit your sodium (Salt) intake    It is important that you exercise regularly, at least 20 minutes 3 to 4 times per week.  If you develop chest pain or shortness of breath seek  medical attention.  Return in 6 months for follow-up  Schedule your colonoscopy to help detect colon cancer. 

## 2010-06-19 ENCOUNTER — Encounter: Payer: Self-pay | Admitting: Internal Medicine

## 2010-06-23 ENCOUNTER — Encounter: Payer: Self-pay | Admitting: Internal Medicine

## 2010-06-23 ENCOUNTER — Ambulatory Visit (INDEPENDENT_AMBULATORY_CARE_PROVIDER_SITE_OTHER): Payer: Medicare Other | Admitting: Internal Medicine

## 2010-06-23 ENCOUNTER — Telehealth: Payer: Self-pay | Admitting: Internal Medicine

## 2010-06-23 VITALS — BP 142/56 | HR 66 | Ht 59.0 in | Wt 139.0 lb

## 2010-06-23 DIAGNOSIS — I4891 Unspecified atrial fibrillation: Secondary | ICD-10-CM

## 2010-06-23 DIAGNOSIS — I10 Essential (primary) hypertension: Secondary | ICD-10-CM

## 2010-06-23 DIAGNOSIS — R0602 Shortness of breath: Secondary | ICD-10-CM

## 2010-06-23 LAB — BASIC METABOLIC PANEL
Calcium: 9.5 mg/dL (ref 8.4–10.5)
Creatinine, Ser: 1 mg/dL (ref 0.4–1.2)
GFR: 56.8 mL/min — ABNORMAL LOW (ref >60.00)
Glucose, Bld: 72 mg/dL (ref 70–99)
Sodium: 135 mEq/L (ref 135–145)

## 2010-06-23 NOTE — Progress Notes (Signed)
The patient presents today for routine electrophysiology followup.  Since last being seen in our clinic, the patient reports doing very well.  Her SOB has improved.  She denies any further symptoms of afib.  She has occasional dry cough and has had "congestion" over the weekend.   Today, she denies symptoms of palpitations, chest pain,  orthopnea, PND, dizziness, presyncope, syncope, or neurologic sequela.  She reports mild BLE edema.  The patient feels that she is tolerating medications without difficulties and is otherwise without complaint today.   Past Medical History  Diagnosis Date  . BUNDLE BRANCH BLOCK, LEFT 07/09/2006  . CAROTID BRUIT, LEFT 07/08/2007  . COLONIC POLYPS, HX OF 07/09/2006  . DEGENERATIVE JOINT DISEASE 07/09/2006  . GERD 07/13/2008  . HYPERLIPIDEMIA 07/09/2006  . HYPERTENSION 07/09/2006  . OSTEOPOROSIS 07/09/2006  . PAROXYSMAL ATRIAL FIBRILLATION 09/30/2009  . PVD (peripheral vascular disease)     with mild bilateral subclavian artery stenosis; mid LV gradient due to vigorous LV contraction  . CAD (coronary artery disease)     minimal, nonobstructive  . Hx of colonoscopy    Past Surgical History  Procedure Date  . Two para two     abortus zero  . Tonsillectomy   . 2-d echo 12/2005    revealed normal LV function and size.  No wall motion at around his  moderate mitral annular calcification.  Elevated LVOT gradient of 22 thought secondary to narrow outflow  tract aortic valve appeared to open well  . Low risk cardiolite 12/2005  . Coronary angioplasty 11/2075    Current Outpatient Prescriptions  Medication Sig Dispense Refill  . Calcium Carb-Cholecalciferol 600-400 MG-UNIT TABS Take by mouth 2 (two) times daily.       . dabigatran (PRADAXA) 150 MG CAPS Take 150 mg by mouth every 12 (twelve) hours.        Marland Kitchen diltiazem (CARDIZEM CD) 120 MG 24 hr capsule Take by mouth. 2 tablets in AM and 1 tablet in PM       . hydrochlorothiazide 25 MG tablet Take 25 mg by mouth daily.         Marland Kitchen lisinopril (PRINIVIL,ZESTRIL) 10 MG tablet Take 10 mg by mouth daily.        . Multiple Vitamin (MULTIVITAMIN) tablet Take 1 tablet by mouth daily.        . nitroGLYCERIN (NITROSTAT) 0.4 MG SL tablet Place 0.4 mg under the tongue every 5 (five) minutes as needed.        Marland Kitchen omeprazole (PRILOSEC) 20 MG capsule Take 20 mg by mouth daily.       . rosuvastatin (CRESTOR) 5 MG tablet Take 5 mg by mouth.       . traMADol (ULTRAM) 50 MG tablet Take 50 mg by mouth every 6 (six) hours as needed.          Allergies  Allergen Reactions  . Sulfamethoxazole     REACTION: unspecified    History   Social History  . Marital Status: Married    Spouse Name: N/A    Number of Children: N/A  . Years of Education: N/A   Occupational History  . Not on file.   Social History Main Topics  . Smoking status: Never Smoker   . Smokeless tobacco: Never Used  . Alcohol Use: No  . Drug Use: No  . Sexually Active: Not on file   Other Topics Concern  . Not on file   Social History Narrative  . No narrative on  file    Family History  Problem Relation Age of Onset  . Coronary artery disease Mother   . Heart attack Mother   . Colon cancer    . Lung cancer    . COPD    . Diabetes     Physical Exam: Filed Vitals:   06/23/10 1041  BP: 142/56  Pulse: 66  Height: 4\' 11"  (1.499 m)  Weight: 139 lb (63.05 kg)    GEN- The patient is well appearing, alert and oriented x 3 today.   Head- normocephalic, atraumatic Eyes-  Sclera clear, conjunctiva pink Ears- hearing intact Oropharynx- clear Neck- supple, no JVP Lymph- no cervical lymphadenopathy Lungs- Clear to ausculation bilaterally, normal work of breathing Heart- Regular rate and rhythm,  2/6 SEM LUSB, early peaking, GI- soft, NT, ND, + BS Extremities- no clubbing, cyanosis, trace edema MS- age appropriate muscle atrophy Skin- no rash or lesion Psych- euthymic mood, full affect Neuro- strength and sensation are intact  EKG today reveals  sinus rhythm 66 bpm, LBBB  Assessment and Plan:

## 2010-06-23 NOTE — Patient Instructions (Signed)
Your physician wants you to follow-up in: 6 months with DrAllred You will receive a reminder letter in the mail two months in advance. If you don't receive a letter, please call our office to schedule the follow-up appointment.  Your physician recommends that you return for lab work today BMP 401.1

## 2010-06-23 NOTE — Telephone Encounter (Signed)
Pt signed ROI,picked up Echo,Cath Reports  06/23/10/km

## 2010-06-23 NOTE — Assessment & Plan Note (Signed)
Salt restriction, continue HCTZ No changes today

## 2010-06-23 NOTE — Assessment & Plan Note (Signed)
Improved Salt restriction advised No changes today

## 2010-06-23 NOTE — Assessment & Plan Note (Signed)
Maintaining sinus rhythm Tolerating pradaxa Her CHADS2 score is at least 2 (age, HTN) and therefore she requires longterm anticoagulation. We will check BMET today to make sure pradaxa dosing is appropriate for CrCl

## 2010-06-24 NOTE — Assessment & Plan Note (Signed)
[facility]                            [facility] OFFICE NOTE   SHAMS, FILL                      MRN:          [id number]  DATE:07/07/2006                            DOB:          1924-11-18    A 77 year old female seen today for an annual exam.  She has  hypertension, DJD, dyslipidemia.  She has done quite well.  She has a  chronic left bundle branch block, osteoporosis, and has done quite well.  Last fall had a negative 2-D echo and Cardiolite stress test that was  low risk.   PAST MEDICAL HISTORY:  Unremarkable.  Gravida 2, para 2, aborta zero.  Remote tonsillectomy.   REVIEW OF SYSTEMS:  Negative.  Last colonoscopy was in 2005.   FAMILY HISTORY:  Unchanged.   EXAMINATION:  Revealed an elderly white female, in no acute distress.  Blood pressure 130/80.  SKIN:  Negative.  Fundi, ears, nose, and throat clear.  NECK:  No bruits.  CHEST:  Clear.  CARDIOVASCULAR:  Normal S1 and S2.  There is a grade 1 to 2 over 6  systolic murmur.  BREASTS:  Negative.  ABDOMEN:  Mildly overweight.  Soft.  Non-tender.  No organomegaly.  No  bruits.  PELVIC EXAM:  Unremarkable.  No adnexal masses.  Stool Hemoccult negative.  EXTREMITIES:  Full peripheral pulses.  No edema.  NEUROLOGIC:  Negative.   IMPRESSION:  1. Hypertension.  2. Hypercholesterolemia.  3. Degenerative joint disease.  4. Osteoporosis.  5. History of colonic polyps.  6. Chronic left bundle branch block.   DISPOSITION:  Medical regimen unchanged.  Reassess in 6 months.  Laboratory update reviewed.    Gordy Savers, MD  Electronically Signed   PFK/MedQ  DD: 07/07/2006  DT: 07/07/2006  Job #: [id number]

## 2010-07-14 ENCOUNTER — Ambulatory Visit (INDEPENDENT_AMBULATORY_CARE_PROVIDER_SITE_OTHER): Payer: Medicare Other | Admitting: Internal Medicine

## 2010-07-14 ENCOUNTER — Encounter: Payer: Self-pay | Admitting: Internal Medicine

## 2010-07-14 DIAGNOSIS — E785 Hyperlipidemia, unspecified: Secondary | ICD-10-CM

## 2010-07-14 DIAGNOSIS — I10 Essential (primary) hypertension: Secondary | ICD-10-CM

## 2010-07-14 NOTE — Progress Notes (Signed)
  Subjective:    Patient ID: Melanie Whitehead, female    DOB: 1925/02/04, 77 y.o.   MRN: [id number]  HPI 77 year old patient who is seen today in followup. [place] and has completed a course of cephalexin. She has had some minimal nonproductive cough and feels much improved. She has a history of treated hypertension and dyslipidemia. She remains on Crestor 5 mg daily. Recent lipid profile reviewed. She has a history of carotid artery disease. She has a history of atrial flutter and denies any palpitations. No fever. Cough is so mild and nonproductive Review of Systems  Constitutional: Negative.   HENT: Negative for hearing loss, congestion, sore throat, rhinorrhea, dental problem, sinus pressure and tinnitus.   Eyes: Negative for pain, discharge and visual disturbance.  Respiratory: Positive for cough. Negative for shortness of breath.   Cardiovascular: Negative for chest pain, palpitations and leg swelling.  Gastrointestinal: Negative for nausea, vomiting, abdominal pain, diarrhea, constipation, blood in stool and abdominal distention.  Genitourinary: Negative for dysuria, urgency, frequency, hematuria, flank pain, vaginal bleeding, vaginal discharge, difficulty urinating, vaginal pain and pelvic pain.  Musculoskeletal: Negative for joint swelling, arthralgias and gait problem.  Skin: Negative for rash.  Neurological: Negative for dizziness, syncope, speech difficulty, weakness, numbness and headaches.  Hematological: Negative for adenopathy.  Psychiatric/Behavioral: Negative for behavioral problems, dysphoric mood and agitation. The patient is not nervous/anxious.        Objective:   Physical Exam  Constitutional: She is oriented to person, place, and time. She appears well-developed and well-nourished. No distress.  HENT:  Head: Normocephalic.  Right Ear: External ear normal.  Left Ear: External ear normal.  Mouth/Throat: Oropharynx is clear and  moist.  Eyes: Conjunctivae and EOM are normal. Pupils are equal, round, and reactive to light.  Neck: Normal range of motion. Neck supple. No thyromegaly present.       Bilateral carotid bruits  Cardiovascular: Normal rate, regular rhythm and intact distal pulses.   Murmur heard.      Grade 3/6 systolic murmur  Pulmonary/Chest: Effort normal and breath sounds normal.       A few bibasilar crackles  Abdominal: Soft. Bowel sounds are normal. She exhibits no mass. There is no tenderness.  Musculoskeletal: Normal range of motion. She exhibits edema.       Trace pedal edema  Lymphadenopathy:    She has no cervical adenopathy.  Neurological: She is alert and oriented to person, place, and time.  Skin: Skin is warm and dry. No rash noted.  Psychiatric: She has a normal mood and affect. Her behavior is normal.          Assessment & Plan:   Status post URI. Clinically improved. No further treatment necessary Hypertension stable Dyslipidemia  We'll recheck in 6 months Cardiology followup as scheduled

## 2010-07-14 NOTE — Patient Instructions (Signed)
Limit your sodium (Salt) intake    It is important that you exercise regularly, at least 20 minutes 3 to 4 times per week.  If you develop chest pain or shortness of breath seek  medical attention.  Return in 6 months for follow-up  

## 2010-07-30 ENCOUNTER — Telehealth: Payer: Self-pay | Admitting: Internal Medicine

## 2010-07-30 NOTE — Telephone Encounter (Signed)
Pt having a cough for about 6 weeks, been on  linsinopril for several months, also colonscopy was recommended as well as a carotid doppler, and bone density test wants to know if she should have these done due to her heart problem

## 2010-07-30 NOTE — Telephone Encounter (Signed)
Patient has c/o dry cough and thinks it may be due to Lisinopril  Wants to know if there is anything else she can take I let patient know I would discuss with Dr Johney Frame and call her back

## 2010-08-11 ENCOUNTER — Telehealth: Payer: Self-pay | Admitting: Internal Medicine

## 2010-08-11 NOTE — Telephone Encounter (Signed)
PT AWARE WILL FORWARD TO DR Johney Frame  FOR REVIEW.  PT C/O COUGH ON LISINOPRIL AND   IS DUE FOR REFILL ON MED  BUT GETS 90 DAYS AT A TIME DOES NOT WANT TO FILL IF MED IS GOING TO BE CHANGED .PLEASE ADIVSE .Melanie Whitehead

## 2010-08-11 NOTE — Telephone Encounter (Signed)
Pt talked with kelly last week re medication , she was to talk with dr allred and hasn't heard back

## 2010-08-14 NOTE — Telephone Encounter (Signed)
If cough presists, switch to cozaar 50mg  daily.  IF cough improved, continue lisinopril.

## 2010-08-18 ENCOUNTER — Other Ambulatory Visit: Payer: Self-pay | Admitting: Internal Medicine

## 2010-08-18 NOTE — Telephone Encounter (Signed)
PER PT COUGH IS BETTER IS WILLING TO STAY ON LISINOPRIL  WILL CALL IF NEEDING TO CHANGE MED .CY

## 2010-09-03 ENCOUNTER — Other Ambulatory Visit: Payer: Self-pay | Admitting: Cardiology

## 2010-09-03 DIAGNOSIS — I6529 Occlusion and stenosis of unspecified carotid artery: Secondary | ICD-10-CM

## 2010-09-04 ENCOUNTER — Encounter (INDEPENDENT_AMBULATORY_CARE_PROVIDER_SITE_OTHER): Payer: Medicare Other | Admitting: Cardiology

## 2010-09-04 DIAGNOSIS — I6529 Occlusion and stenosis of unspecified carotid artery: Secondary | ICD-10-CM

## 2010-09-08 ENCOUNTER — Encounter: Payer: Self-pay | Admitting: Internal Medicine

## 2010-09-08 NOTE — Progress Notes (Signed)
Quick Note:  Spoke with pt - informed results stable repeat in 1 year ______

## 2010-11-21 ENCOUNTER — Other Ambulatory Visit: Payer: Self-pay | Admitting: Internal Medicine

## 2010-12-03 ENCOUNTER — Ambulatory Visit (INDEPENDENT_AMBULATORY_CARE_PROVIDER_SITE_OTHER): Payer: Medicare Other | Admitting: Internal Medicine

## 2010-12-03 ENCOUNTER — Encounter: Payer: Self-pay | Admitting: Internal Medicine

## 2010-12-03 DIAGNOSIS — E785 Hyperlipidemia, unspecified: Secondary | ICD-10-CM

## 2010-12-03 DIAGNOSIS — I4891 Unspecified atrial fibrillation: Secondary | ICD-10-CM

## 2010-12-03 DIAGNOSIS — I708 Atherosclerosis of other arteries: Secondary | ICD-10-CM

## 2010-12-03 DIAGNOSIS — I10 Essential (primary) hypertension: Secondary | ICD-10-CM

## 2010-12-03 DIAGNOSIS — I6529 Occlusion and stenosis of unspecified carotid artery: Secondary | ICD-10-CM

## 2010-12-03 DIAGNOSIS — I771 Stricture of artery: Secondary | ICD-10-CM | POA: Insufficient documentation

## 2010-12-03 MED ORDER — ZOLPIDEM TARTRATE 5 MG PO TABS: 5.0000 mg | ORAL_TABLET | Freq: Every evening | ORAL | Status: DC | PRN

## 2010-12-03 NOTE — Patient Instructions (Signed)
Limit your sodium (Salt) intake    It is important that you exercise regularly, at least 20 minutes 3 to 4 times per week.  If you develop chest pain or shortness of breath seek  medical attention.  Please check your blood pressure on a regular basis.  If it is consistently greater than 150/90, please make an office appointment.  

## 2010-12-03 NOTE — Progress Notes (Signed)
  Subjective:    Patient ID: Melanie Whitehead, female    DOB: 11-08-1924, 77 y.o.   MRN: [id number]  HPI  77 year old patient who is in today for followup. She is treated hypertension and dyslipidemia she has done quite well over the past 6 months. She does have a history of bilateral carotid bruits and has had a recent carotid artery Doppler study. She has paroxysmal atrial fibrillation and remains on anticoagulation therapy. She is doing quite well today and denies any cardiopulmonary complaints. She remains on Crestor which he tolerates well    Review of Systems  Constitutional: Negative.   HENT: Negative for hearing loss, congestion, sore throat, rhinorrhea, dental problem, sinus pressure and tinnitus.   Eyes: Negative for pain, discharge and visual disturbance.  Respiratory: Negative for cough and shortness of breath.   Cardiovascular: Negative for chest pain, palpitations and leg swelling.  Gastrointestinal: Negative for nausea, vomiting, abdominal pain, diarrhea, constipation, blood in stool and abdominal distention.  Genitourinary: Negative for dysuria, urgency, frequency, hematuria, flank pain, vaginal bleeding, vaginal discharge, difficulty urinating, vaginal pain and pelvic pain.  Musculoskeletal: Negative for joint swelling, arthralgias and gait problem.  Skin: Negative for rash.  Neurological: Negative for dizziness, syncope, speech difficulty, weakness, numbness and headaches.  Hematological: Negative for adenopathy.  Psychiatric/Behavioral: Negative for behavioral problems, dysphoric mood and agitation. The patient is not nervous/anxious.        Objective:   Physical Exam  Constitutional: She is oriented to person, place, and time. She appears well-developed and well-nourished.  HENT:  Head: Normocephalic.  Right Ear: External ear normal.  Left Ear: External ear normal.  Mouth/Throat: Oropharynx is clear and moist.  Eyes: Conjunctivae and EOM are normal. Pupils are equal,  round, and reactive to light.  Neck: Normal range of motion. Neck supple. No thyromegaly present.       Bilateral carotid bruits  Cardiovascular: Normal rate and regular rhythm.   Murmur heard.      Grade 2- 3/6 systolic murmur  Pulmonary/Chest: Effort normal and breath sounds normal.  Abdominal: Soft. Bowel sounds are normal. She exhibits no mass. There is no tenderness.  Musculoskeletal: Normal range of motion.  Lymphadenopathy:    She has no cervical adenopathy.  Neurological: She is alert and oriented to person, place, and time.  Skin: Skin is warm and dry. No rash noted.  Psychiatric: She has a normal mood and affect. Her behavior is normal.          Assessment & Plan:   Hypertension reasonable control Dyslipidemia. Will continue Crestor 5 mg daily Paroxysmal atrial fibrillation. Will continue anticoagulation  Schedule CPX in 6 months or

## 2010-12-08 ENCOUNTER — Ambulatory Visit: Payer: Medicare Other | Admitting: Internal Medicine

## 2010-12-15 ENCOUNTER — Ambulatory Visit (INDEPENDENT_AMBULATORY_CARE_PROVIDER_SITE_OTHER): Payer: Medicare Other | Admitting: Internal Medicine

## 2010-12-15 ENCOUNTER — Encounter: Payer: Self-pay | Admitting: Internal Medicine

## 2010-12-15 DIAGNOSIS — I4891 Unspecified atrial fibrillation: Secondary | ICD-10-CM

## 2010-12-15 DIAGNOSIS — I708 Atherosclerosis of other arteries: Secondary | ICD-10-CM

## 2010-12-15 DIAGNOSIS — I771 Stricture of artery: Secondary | ICD-10-CM

## 2010-12-15 DIAGNOSIS — Q248 Other specified congenital malformations of heart: Secondary | ICD-10-CM | POA: Insufficient documentation

## 2010-12-15 DIAGNOSIS — I10 Essential (primary) hypertension: Secondary | ICD-10-CM

## 2010-12-15 LAB — CBC WITH DIFFERENTIAL/PLATELET
Basophils Relative: 0.3 % (ref 0.0–3.0)
Eosinophils Relative: 2.1 % (ref 0.0–5.0)
HCT: 38 % (ref 36.0–46.0)
Hemoglobin: 13.1 g/dL (ref 12.0–15.0)
Lymphs Abs: 1.7 10*3/uL (ref 0.7–4.0)
Monocytes Relative: 8.6 % (ref 3.0–12.0)
Neutro Abs: 4 10*3/uL (ref 1.4–7.7)
RBC: 4.16 Mil/uL (ref 3.87–5.11)
WBC: 6.3 10*3/uL (ref 4.5–10.5)

## 2010-12-15 LAB — BASIC METABOLIC PANEL
GFR: 42.56 mL/min — ABNORMAL LOW (ref >60.00)
Glucose, Bld: 116 mg/dL — ABNORMAL HIGH (ref 70–99)
Potassium: 4.3 mEq/L (ref 3.5–5.1)
Sodium: 139 mEq/L (ref 135–145)

## 2010-12-15 NOTE — Progress Notes (Signed)
The patient presents today for routine cardiology followup.  Since last being seen in our clinic, the patient reports doing very well.  Her SOB has improved.  She denies any further symptoms of afib.  She finds that her blood pressure remains frequently elevated.   Today, she denies symptoms of palpitations, chest pain,  orthopnea, PND, dizziness, presyncope, syncope, or neurologic sequela.  She reports mild BLE edema.  The patient feels that she is tolerating medications without difficulties and is otherwise without complaint today.   Past Medical History  Diagnosis Date  . BUNDLE BRANCH BLOCK, LEFT 07/09/2006  . CAROTID BRUIT, LEFT 07/08/2007  . COLONIC POLYPS, HX OF 07/09/2006  . DEGENERATIVE JOINT DISEASE 07/09/2006  . GERD 07/13/2008  . HYPERLIPIDEMIA 07/09/2006  . HYPERTENSION 07/09/2006  . OSTEOPOROSIS 07/09/2006  . PAROXYSMAL ATRIAL FIBRILLATION 09/30/2009  . PVD (peripheral vascular disease)     moderate R and severe L subclavian artery stenosis  . CAD (coronary artery disease)     minimal, nonobstructive  . Hx of colonoscopy   . Left ventricular outflow tract obstruction     due to dynamic obstruction with LVH   Past Surgical History  Procedure Date  . Two para two     abortus zero  . Tonsillectomy   . 2-d echo 12/2005    revealed normal LV function and size.  No wall motion at around his  moderate mitral annular calcification.  Elevated LVOT gradient of 22 thought secondary to narrow outflow  tract aortic valve appeared to open well  . Low risk cardiolite 12/2005  . Coronary angioplasty 11/2075    Current Outpatient Prescriptions  Medication Sig Dispense Refill  . Calcium Carb-Cholecalciferol 600-400 MG-UNIT TABS Take by mouth 2 (two) times daily.       . CRESTOR 5 MG tablet TAKE 1 TABLET DAILY  90 tablet  2  . dabigatran (PRADAXA) 150 MG CAPS Take 150 mg by mouth every 12 (twelve) hours.        Marland Kitchen diltiazem (CARDIZEM CD) 120 MG 24 hr capsule TAKE 2 CAPSULES IN THE MORNING AND 1  CAPSULE IN THE EVENING  270 capsule  2  . hydrochlorothiazide 25 MG tablet Take 25 mg by mouth daily.        Marland Kitchen lisinopril (PRINIVIL,ZESTRIL) 10 MG tablet Take 10 mg by mouth daily.        . Multiple Vitamin (MULTIVITAMIN) tablet Take 1 tablet by mouth daily.        . nitroGLYCERIN (NITROSTAT) 0.4 MG SL tablet Place 0.4 mg under the tongue every 5 (five) minutes as needed.        Marland Kitchen omeprazole (PRILOSEC) 20 MG capsule TAKE 1 CAPSULE TWICE DAILY  180 capsule  3  . zolpidem (AMBIEN) 5 MG tablet Take 1 tablet (5 mg total) by mouth at bedtime as needed for sleep.  30 tablet  0    Allergies  Allergen Reactions  . Sulfamethoxazole     REACTION: unspecified    History   Social History  . Marital Status: Married    Spouse Name: N/A    Number of Children: N/A  . Years of Education: N/A   Occupational History  . Not on file.   Social History Main Topics  . Smoking status: Never Smoker   . Smokeless tobacco: Never Used  . Alcohol Use: No  . Drug Use: No  . Sexually Active: Not on file   Other Topics Concern  . Not on file   Social  History Narrative  . No narrative on file    Family History  Problem Relation Age of Onset  . Coronary artery disease Mother   . Heart attack Mother   . Colon cancer    . Lung cancer    . COPD    . Diabetes     Physical Exam: Filed Vitals:   12/15/10 1117 12/15/10 1119  BP: 148/69 148/69  Pulse: 64 64  Height: 5\' 1"  (1.549 m) 5\' 1"  (1.549 m)  Weight: 142 lb (64.411 kg) 142 lb (64.411 kg)    GEN- The patient is well appearing, alert and oriented x 3 today.   Head- normocephalic, atraumatic Eyes-  Sclera clear, conjunctiva pink Ears- hearing intact Oropharynx- clear Neck- supple, no JVP Lymph- no cervical lymphadenopathy Lungs- Clear to ausculation bilaterally, normal work of breathing Heart- Regular rate and rhythm,  2/6 SEM LUSB, early peaking, GI- soft, NT, ND, + BS Extremities- no clubbing, cyanosis, trace edema MS- age appropriate  muscle atrophy Skin- no rash or lesion Psych- euthymic mood, full affect Neuro- strength and sensation are intact  EKG today reveals sinus rhythm 64 bpm, LBBB  Assessment and Plan:

## 2010-12-15 NOTE — Patient Instructions (Signed)
Your physician wants you to follow-up in: 6 months with Dr Jacquiline Doe will receive a reminder letter in the mail two months in advance. If you don't receive a letter, please call our office to schedule the follow-up appointment.   Your physician has requested that you have an echocardiogram. Echocardiography is a painless test that uses sound waves to create images of your heart. It provides your doctor with information about the size and shape of your heart and how well your heart's chambers and valves are working. This procedure takes approximately one hour. There are no restrictions for this procedure.  Your physician recommends that you return for lab work in: bmp/cbc

## 2010-12-15 NOTE — Assessment & Plan Note (Signed)
Increase diltiazem as above Repeat echo

## 2010-12-15 NOTE — Assessment & Plan Note (Signed)
Likely responsible for refractory HTN NO symptoms of steal  I had a long discussion with pt and her daughter.  Given her advanced age, I do not think that she would be a candidate for surgical intervention.  I have offered evaluation by interventional cardiology/vascular surgery and she declines.  We will follow clinically.

## 2010-12-15 NOTE — Assessment & Plan Note (Signed)
Maintaining sinus  Continue pradaxa Check BMET and CBC today

## 2010-12-15 NOTE — Assessment & Plan Note (Signed)
Remains above goal Will increase diltiazem to 240mg  BID today Consider increasing ace inhibitor if remains above goal

## 2010-12-23 ENCOUNTER — Ambulatory Visit (HOSPITAL_COMMUNITY): Payer: Medicare Other | Attending: Internal Medicine | Admitting: Radiology

## 2010-12-23 DIAGNOSIS — I319 Disease of pericardium, unspecified: Secondary | ICD-10-CM | POA: Insufficient documentation

## 2010-12-23 DIAGNOSIS — I080 Rheumatic disorders of both mitral and aortic valves: Secondary | ICD-10-CM | POA: Insufficient documentation

## 2010-12-23 DIAGNOSIS — I079 Rheumatic tricuspid valve disease, unspecified: Secondary | ICD-10-CM | POA: Insufficient documentation

## 2010-12-23 DIAGNOSIS — I4891 Unspecified atrial fibrillation: Secondary | ICD-10-CM | POA: Insufficient documentation

## 2010-12-23 DIAGNOSIS — I10 Essential (primary) hypertension: Secondary | ICD-10-CM | POA: Insufficient documentation

## 2010-12-24 ENCOUNTER — Other Ambulatory Visit (HOSPITAL_COMMUNITY): Payer: Medicare Other | Admitting: Radiology

## 2010-12-25 ENCOUNTER — Telehealth: Payer: Self-pay | Admitting: Internal Medicine

## 2010-12-25 NOTE — Telephone Encounter (Addendum)
Pt Called 12/24/10, asking for Copy Of Echo to be put on CD, She will pick up 12/25/10 and Sign ROI    12/25/10/km  Pt Picked Up CD  12/25/10/km

## 2010-12-26 ENCOUNTER — Other Ambulatory Visit: Payer: Self-pay

## 2010-12-26 ENCOUNTER — Telehealth: Payer: Self-pay | Admitting: Internal Medicine

## 2010-12-26 MED ORDER — DABIGATRAN ETEXILATE MESYLATE 75 MG PO CAPS: 75.0000 mg | ORAL_CAPSULE | Freq: Two times a day (BID) | ORAL | Status: DC

## 2010-12-26 NOTE — Telephone Encounter (Signed)
Spoke with patient and she is going to decrease her Pradaxa to 75mg  twice daily when she gets back in town next week  I also gave her the results of her echo of which she says someone else had given her but you can not tell from the notes

## 2010-12-26 NOTE — Telephone Encounter (Signed)
Spoke with husband  She is getting her hair cut Will call back around 2pm

## 2010-12-26 NOTE — Telephone Encounter (Signed)
Pt calling back about test results.

## 2011-01-05 ENCOUNTER — Telehealth: Payer: Self-pay | Admitting: Internal Medicine

## 2011-01-05 NOTE — Telephone Encounter (Signed)
New message:  She has a question about her Pradaxa being off the market and also about an increase of medication that is not working.  Please give her a call.

## 2011-01-05 NOTE — Telephone Encounter (Signed)
I am not aware of the 75mg  dose being recalled.  I think that this was only certain lot numbers.  Please check with Kennon Rounds and follow-up with patient. Her elevated BP is likely worsened by subclavian stenosis.  Given her age and fragility, I would not make further BP changes at this point. We will continue to monitor.

## 2011-01-05 NOTE — Telephone Encounter (Signed)
On 12/15/10 he increased her Cardizem 120mg  twice daily  Her BP is still around 150/53  HR 44  Wants to know what to do?  Was told Pradaxa 75mg  had been recalled  And was wondering what to do  She has been taking the 150mg  bid

## 2011-01-06 NOTE — Telephone Encounter (Signed)
Spoke with patient and she is aware  We will continue to monitor her blood pressure and if her HR drops below 40's she will call our office.  She is also going to call the pharmacy and see if she can split the Pradaxa 150mg  in 1/2 to get the 75mg  dose.

## 2011-01-09 ENCOUNTER — Other Ambulatory Visit: Payer: Self-pay | Admitting: Internal Medicine

## 2011-01-09 MED ORDER — DABIGATRAN ETEXILATE MESYLATE 75 MG PO CAPS: 75.0000 mg | ORAL_CAPSULE | Freq: Two times a day (BID) | ORAL | Status: DC

## 2011-01-09 NOTE — Telephone Encounter (Signed)
New Msg: Pt would like 90 day supply of pradaxa sent to [facility]. Please return pt call to discuss further if necessary.

## 2011-01-13 ENCOUNTER — Other Ambulatory Visit: Payer: Self-pay | Admitting: Internal Medicine

## 2011-01-21 NOTE — Telephone Encounter (Signed)
Discussed with Dr Johney Frame, okay to hold for 2 days prior and resume 24 hours after procedure

## 2011-01-21 NOTE — Telephone Encounter (Signed)
New problem:  Patient need to be taken off praxada for few days - due to gum surgery. Date 12/19. Fax # [phone]

## 2011-02-17 ENCOUNTER — Telehealth: Payer: Self-pay | Admitting: Internal Medicine

## 2011-02-17 ENCOUNTER — Other Ambulatory Visit: Payer: Self-pay | Admitting: *Deleted

## 2011-02-17 MED ORDER — DILTIAZEM HCL ER 240 MG PO CP24: 240.0000 mg | ORAL_CAPSULE | Freq: Two times a day (BID) | ORAL | Status: DC

## 2011-02-17 MED ORDER — ROSUVASTATIN CALCIUM 5 MG PO TABS: 5.0000 mg | ORAL_TABLET | Freq: Every day | ORAL | Status: DC

## 2011-02-17 MED ORDER — DILTIAZEM HCL ER COATED BEADS 120 MG PO CP24: ORAL_CAPSULE | ORAL | Status: DC

## 2011-02-17 NOTE — Telephone Encounter (Signed)
Deleted last prescription i sent in to pharmacy seems as if there was an increase in the medication on 12/15/10 it states................Marland KitchenWill increase diltiazem to 240mg  BID today Consider increasing ace inhibitor if remains above goal

## 2011-02-17 NOTE — Telephone Encounter (Signed)
Diltiazem 240 mg twice a day 90 supply and crestor 5 mg 90 day supply uses [facility] [address]

## 2011-03-18 DIAGNOSIS — H2510 Age-related nuclear cataract, unspecified eye: Secondary | ICD-10-CM | POA: Diagnosis not present

## 2011-03-23 ENCOUNTER — Other Ambulatory Visit: Payer: Self-pay | Admitting: Internal Medicine

## 2011-03-23 MED ORDER — LISINOPRIL 10 MG PO TABS: 10.0000 mg | ORAL_TABLET | Freq: Every day | ORAL | Status: DC

## 2011-03-23 MED ORDER — OMEPRAZOLE 20 MG PO CPDR: 20.0000 mg | DELAYED_RELEASE_CAPSULE | Freq: Every day | ORAL | Status: DC

## 2011-03-23 NOTE — Telephone Encounter (Signed)
New Refill    Patient requesting 90 day supply, verified Pharmacy ( [facility] [address] [place], [place]) correct   Lisinopril (PRINIVIL,ZESTRIL) 10 MG tablet    Patient can be reached at hm# if there are any additional questions

## 2011-03-23 NOTE — Telephone Encounter (Signed)
Pt need new rx omeprazole 20 mg once a day #90 call into [facility] [address]

## 2011-04-13 DIAGNOSIS — L820 Inflamed seborrheic keratosis: Secondary | ICD-10-CM | POA: Diagnosis not present

## 2011-04-13 DIAGNOSIS — D1801 Hemangioma of skin and subcutaneous tissue: Secondary | ICD-10-CM | POA: Diagnosis not present

## 2011-04-13 DIAGNOSIS — L819 Disorder of pigmentation, unspecified: Secondary | ICD-10-CM | POA: Diagnosis not present

## 2011-04-13 DIAGNOSIS — L821 Other seborrheic keratosis: Secondary | ICD-10-CM | POA: Diagnosis not present

## 2011-04-13 DIAGNOSIS — D485 Neoplasm of uncertain behavior of skin: Secondary | ICD-10-CM | POA: Diagnosis not present

## 2011-04-13 DIAGNOSIS — D239 Other benign neoplasm of skin, unspecified: Secondary | ICD-10-CM | POA: Diagnosis not present

## 2011-04-13 DIAGNOSIS — L570 Actinic keratosis: Secondary | ICD-10-CM | POA: Diagnosis not present

## 2011-04-17 ENCOUNTER — Other Ambulatory Visit: Payer: Self-pay | Admitting: Internal Medicine

## 2011-04-17 MED ORDER — DABIGATRAN ETEXILATE MESYLATE 75 MG PO CAPS: 75.0000 mg | ORAL_CAPSULE | Freq: Two times a day (BID) | ORAL | Status: DC

## 2011-04-17 MED ORDER — HYDROCHLOROTHIAZIDE 25 MG PO TABS: 25.0000 mg | ORAL_TABLET | Freq: Every day | ORAL | Status: DC

## 2011-04-17 NOTE — Telephone Encounter (Signed)
Patient need a 90 day supply of hctz sent to [facility] [address]. Please assist.

## 2011-04-17 NOTE — Telephone Encounter (Signed)
done

## 2011-04-17 NOTE — Telephone Encounter (Signed)
Refill   Patient refilling Pradaxa RX, verified pharmacy [facility] [address] ([place], [place]).  Patient request 90 day supply. She can be reached at hm# [phone] for additional questions.

## 2011-05-27 DIAGNOSIS — Z1231 Encounter for screening mammogram for malignant neoplasm of breast: Secondary | ICD-10-CM | POA: Diagnosis not present

## 2011-05-27 LAB — HM MAMMOGRAPHY

## 2011-05-28 ENCOUNTER — Encounter: Payer: Self-pay | Admitting: Internal Medicine

## 2011-06-10 ENCOUNTER — Ambulatory Visit (INDEPENDENT_AMBULATORY_CARE_PROVIDER_SITE_OTHER): Payer: Medicare Other | Admitting: Internal Medicine

## 2011-06-10 ENCOUNTER — Encounter: Payer: Self-pay | Admitting: Internal Medicine

## 2011-06-10 VITALS — BP 110/60 | HR 60 | Temp 97.7°F | Resp 16 | Ht 60.5 in | Wt 140.0 lb

## 2011-06-10 DIAGNOSIS — E785 Hyperlipidemia, unspecified: Secondary | ICD-10-CM | POA: Diagnosis not present

## 2011-06-10 DIAGNOSIS — I10 Essential (primary) hypertension: Secondary | ICD-10-CM | POA: Diagnosis not present

## 2011-06-10 DIAGNOSIS — Z0000 Encounter for general adult medical examination without abnormal findings: Secondary | ICD-10-CM | POA: Diagnosis not present

## 2011-06-10 DIAGNOSIS — M810 Age-related osteoporosis without current pathological fracture: Secondary | ICD-10-CM | POA: Diagnosis not present

## 2011-06-10 DIAGNOSIS — I6529 Occlusion and stenosis of unspecified carotid artery: Secondary | ICD-10-CM | POA: Diagnosis not present

## 2011-06-10 DIAGNOSIS — I4892 Unspecified atrial flutter: Secondary | ICD-10-CM | POA: Diagnosis not present

## 2011-06-10 DIAGNOSIS — I4891 Unspecified atrial fibrillation: Secondary | ICD-10-CM

## 2011-06-10 DIAGNOSIS — M549 Dorsalgia, unspecified: Secondary | ICD-10-CM

## 2011-06-10 DIAGNOSIS — J069 Acute upper respiratory infection, unspecified: Secondary | ICD-10-CM

## 2011-06-10 DIAGNOSIS — I447 Left bundle-branch block, unspecified: Secondary | ICD-10-CM

## 2011-06-10 MED ORDER — NITROGLYCERIN 0.4 MG SL SUBL: 0.4000 mg | SUBLINGUAL_TABLET | SUBLINGUAL | Status: DC | PRN

## 2011-06-10 MED ORDER — TRAMADOL HCL 50 MG PO TABS: 50.0000 mg | ORAL_TABLET | Freq: Three times a day (TID) | ORAL | Status: AC | PRN

## 2011-06-10 MED ORDER — OMEPRAZOLE 20 MG PO CPDR: 20.0000 mg | DELAYED_RELEASE_CAPSULE | Freq: Every day | ORAL | Status: DC

## 2011-06-10 MED ORDER — DILTIAZEM HCL ER 240 MG PO CP24: 240.0000 mg | ORAL_CAPSULE | Freq: Two times a day (BID) | ORAL | Status: DC

## 2011-06-10 MED ORDER — DABIGATRAN ETEXILATE MESYLATE 75 MG PO CAPS: 75.0000 mg | ORAL_CAPSULE | Freq: Two times a day (BID) | ORAL | Status: DC

## 2011-06-10 MED ORDER — LISINOPRIL 20 MG PO TABS: 20.0000 mg | ORAL_TABLET | Freq: Every day | ORAL | Status: DC

## 2011-06-10 MED ORDER — HYDROCHLOROTHIAZIDE 25 MG PO TABS: 25.0000 mg | ORAL_TABLET | Freq: Every day | ORAL | Status: DC

## 2011-06-10 MED ORDER — ROSUVASTATIN CALCIUM 5 MG PO TABS: 5.0000 mg | ORAL_TABLET | Freq: Every day | ORAL | Status: DC

## 2011-06-10 NOTE — Progress Notes (Signed)
Subjective:    Patient ID: Melanie Whitehead, female    DOB: [birth year], 78 y.o.   MRN: [id number]  HPI  78 year old patient who is seen today for a preventive health examination. She is followed closely by cardiology due to paroxysmal atrial fibrillation. She has a history of upper for vascular disease with left subclavian artery stenosis. She has a chronic left bundle branch block, LVH and LVOT obstruction. She is doing quite well clinically without concerns or complaints. Denies any cardiopulmonary complaints or any focal neurological symptoms.   Allergies:  1) Sulfamethoxazole (Sulfamethoxazole)   Past History:  Past Medical History:  Colonic polyps, hx of  Hyperlipidemia  Hypertension  Osteoporosis  LBBB  DJD  GERD  right ICA stenosis   Past Surgical History:  two para two, abortus zero  Tonsillectomy  2-D echocardiogram November 2007 revealed normal LV function and size. No wall motion at around his moderate mitral annular calcification. Elevated LVOT gradient of 22 thought secondary to narrow outflow tract aortic valve appeared to open well  low risk Cardiolite November 2007  colonoscopy 2005  carotid artery Doppler evaluation and 2009, 2010   Family History:  Reviewed history from 12/30/2006 and no changes required.  father died age 46 brother after disease  mother died age 76, coronary artery disease, MI, history colon cancer  Four brothers two sisters  Positive for lung cancer on her artery disease, COPD, diabetes; CAD   Social History:  Reviewed history from 07/08/2007 and no changes required.  Married  Never Smoked   1. Risk factors, based on past M,S,F history- cardiovascular risk factors include hypertension and dyslipidemia. She does have a history of paroxysmal atrial fibrillation. She did have a heart catheterization performed in October of last year that revealed minimal nonobstructive coronary artery disease  2. Physical activities: No activity restrictions    3. Depression/mood: No history of depression or mood disorder  4. Hearing: No deficits  5. ADL's: Independent in all aspects of daily living  6. Fall risk: Low  7. Home safety: No problems identified  8. Height weight, and visual acuity; height and weight stable no change in visual acuity  9. Counseling: Heart healthy diet restricted salt diet also encouraged daily exercise recommended  10. Lab orders based on risk factors:  11. Referral: Not appropriate at this time except for followup colonoscopy. She has a history of colonic polyps. Will need to hold Robaxin 5 days prior to procedure she was notified for followup colonoscopy last fall  12. Care plan: Follow colonoscopy  13. Cognitive assessment: Alert and oriented with normal affect; no history of cognitive dysfunction    Past Medical History  Diagnosis Date  . BUNDLE BRANCH BLOCK, LEFT 07/09/2006  . CAROTID BRUIT, LEFT 07/08/2007  . COLONIC POLYPS, HX OF 07/09/2006  . DEGENERATIVE JOINT DISEASE 07/09/2006  . GERD 07/13/2008  . HYPERLIPIDEMIA 07/09/2006  . HYPERTENSION 07/09/2006  . OSTEOPOROSIS 07/09/2006  . PAROXYSMAL ATRIAL FIBRILLATION 09/30/2009  . PVD (peripheral vascular disease)     moderate R and severe L subclavian artery stenosis  . CAD (coronary artery disease)     minimal, nonobstructive  . Hx of colonoscopy   . Left ventricular outflow tract obstruction     due to dynamic obstruction with LVH    History   Social History  . Marital Status: Married    Spouse Name: N/A    Number of Children: N/A  . Years of Education: N/A   Occupational History  .  Not on file.   Social History Main Topics  . Smoking status: Never Smoker   . Smokeless tobacco: Never Used  . Alcohol Use: No  . Drug Use: No  . Sexually Active: Not on file   Other Topics Concern  . Not on file   Social History Narrative  . No narrative on file    Past Surgical History  Procedure Date  . Two para two     abortus zero  . Tonsillectomy    . 2-d echo 12/2005    revealed normal LV function and size.  No wall motion at around his  moderate mitral annular calcification.  Elevated LVOT gradient of 22 thought secondary to narrow outflow  tract aortic valve appeared to open well  . Low risk cardiolite 12/2005  . Coronary angioplasty 11/2075    Family History  Problem Relation Age of Onset  . Coronary artery disease Mother   . Heart attack Mother   . Colon cancer    . Lung cancer    . COPD    . Diabetes      Allergies  Allergen Reactions  . Sulfamethoxazole     REACTION: unspecified    Current Outpatient Prescriptions on File Prior to Visit  Medication Sig Dispense Refill  . Calcium Carb-Cholecalciferol 600-400 MG-UNIT TABS Take by mouth 2 (two) times daily.       . dabigatran (PRADAXA) 75 MG CAPS Take 1 capsule (75 mg total) by mouth every 12 (twelve) hours.  180 capsule  1  . diltiazem (DILACOR XR) 240 MG 24 hr capsule Take 1 capsule (240 mg total) by mouth 2 (two) times daily.  180 capsule  3  . hydrochlorothiazide (HYDRODIURIL) 25 MG tablet Take 1 tablet (25 mg total) by mouth daily.  90 tablet  3  . lisinopril (PRINIVIL,ZESTRIL) 10 MG tablet Take 1 tablet (10 mg total) by mouth daily.  30 tablet  11  . Multiple Vitamin (MULTIVITAMIN) tablet Take 1 tablet by mouth daily.        . nitroGLYCERIN (NITROSTAT) 0.4 MG SL tablet Place 0.4 mg under the tongue every 5 (five) minutes as needed.        Marland Kitchen omeprazole (PRILOSEC) 20 MG capsule Take 1 capsule (20 mg total) by mouth daily.  90 capsule  3  . rosuvastatin (CRESTOR) 5 MG tablet Take 1 tablet (5 mg total) by mouth daily.  90 tablet  2    BP 110/60  Pulse 60  Temp(Src) 97.7 F (36.5 C) (Oral)  Resp 16  Ht 5' 0.5" (1.537 m)  Wt 140 lb (63.504 kg)  BMI 26.89 kg/m2  SpO2 98%      Review of Systems  Constitutional: Negative.   HENT: Negative for hearing loss, congestion, sore throat, rhinorrhea, dental problem, sinus pressure and tinnitus.   Eyes: Negative for  pain, discharge and visual disturbance.  Respiratory: Negative for cough and shortness of breath.   Cardiovascular: Negative for chest pain, palpitations and leg swelling.  Gastrointestinal: Negative for nausea, vomiting, abdominal pain, diarrhea, constipation, blood in stool and abdominal distention.  Genitourinary: Negative for dysuria, urgency, frequency, hematuria, flank pain, vaginal bleeding, vaginal discharge, difficulty urinating, vaginal pain and pelvic pain.  Musculoskeletal: Positive for back pain and arthralgias. Negative for joint swelling and gait problem.  Skin: Negative for rash.  Neurological: Negative for dizziness, syncope, speech difficulty, weakness, numbness and headaches.  Hematological: Negative for adenopathy.  Psychiatric/Behavioral: Negative for behavioral problems, dysphoric mood and agitation. The patient is  not nervous/anxious.        Objective:   Physical Exam  Constitutional: She is oriented to person, place, and time. She appears well-developed and well-nourished.       Blood pressure 160/50 in both arms (h/o L subcl artery stenosis)  HENT:  Head: Normocephalic and atraumatic.  Right Ear: External ear normal.  Left Ear: External ear normal.  Mouth/Throat: Oropharynx is clear and moist.       Cerumen right canal  Eyes: Conjunctivae and EOM are normal.       Left cataract  Neck: Normal range of motion. Neck supple. No JVD present. No thyromegaly present.       Bilateral carotid bruits; loud bruit over the left subclavian artery  Cardiovascular: Normal rate, regular rhythm and intact distal pulses.   Murmur heard.      Grade 3/6 systolic murmur heard diffusely  Pedal pulses were full  Pulmonary/Chest: Effort normal and breath sounds normal. She has no wheezes. She has no rales.  Abdominal: Soft. Bowel sounds are normal. She exhibits no distension and no mass. There is no tenderness. There is no rebound and no guarding.  Musculoskeletal: Normal range of  motion. She exhibits no edema and no tenderness.  Neurological: She is alert and oriented to person, place, and time. She has normal reflexes. No cranial nerve deficit. She exhibits normal muscle tone. Coordination normal.  Skin: Skin is warm and dry. No rash noted.  Psychiatric: She has a normal mood and affect. Her behavior is normal.          Assessment & Plan:  Preventive health examination Hypertension- persistent elevated systolic readings. Will increase lisinopril to 20 mg daily Dyslipidemia. We'll continue Crestor Osteoarthritis with intermittent low back pain. We'll give a prescription for tramadol to take as needed

## 2011-06-10 NOTE — Patient Instructions (Signed)
Limit your sodium (Salt) intake    It is important that you exercise regularly, at least 20 minutes 3 to 4 times per week.  If you develop chest pain or shortness of breath seek  medical attention.  Return in 3 months for follow-up  

## 2011-06-22 ENCOUNTER — Ambulatory Visit (INDEPENDENT_AMBULATORY_CARE_PROVIDER_SITE_OTHER): Payer: Medicare Other | Admitting: Internal Medicine

## 2011-06-22 ENCOUNTER — Encounter: Payer: Self-pay | Admitting: Internal Medicine

## 2011-06-22 VITALS — BP 140/52 | HR 58 | Resp 18 | Ht 61.0 in | Wt 139.0 lb

## 2011-06-22 DIAGNOSIS — I447 Left bundle-branch block, unspecified: Secondary | ICD-10-CM | POA: Diagnosis not present

## 2011-06-22 DIAGNOSIS — I771 Stricture of artery: Secondary | ICD-10-CM

## 2011-06-22 DIAGNOSIS — E785 Hyperlipidemia, unspecified: Secondary | ICD-10-CM

## 2011-06-22 DIAGNOSIS — I4891 Unspecified atrial fibrillation: Secondary | ICD-10-CM

## 2011-06-22 DIAGNOSIS — I10 Essential (primary) hypertension: Secondary | ICD-10-CM | POA: Diagnosis not present

## 2011-06-22 DIAGNOSIS — I708 Atherosclerosis of other arteries: Secondary | ICD-10-CM

## 2011-06-22 DIAGNOSIS — I872 Venous insufficiency (chronic) (peripheral): Secondary | ICD-10-CM

## 2011-06-22 NOTE — Progress Notes (Signed)
The patient presents today for routine cardiology followup.  Since last being seen in our clinic, the patient reports doing very well.  Her SOB has improved.  She denies any further symptoms of afib.  She finds that her blood pressure remains frequently elevated.   Today, she denies symptoms of palpitations, chest pain,  orthopnea, PND, dizziness, presyncope, syncope, or neurologic sequela.  She reports mild BLE edema.  The patient feels that she is tolerating medications without difficulties and is otherwise without complaint today.   Past Medical History  Diagnosis Date  . BUNDLE BRANCH BLOCK, LEFT 07/09/2006  . CAROTID BRUIT, LEFT 07/08/2007  . COLONIC POLYPS, HX OF 07/09/2006  . DEGENERATIVE JOINT DISEASE 07/09/2006  . GERD 07/13/2008  . HYPERLIPIDEMIA 07/09/2006  . HYPERTENSION 07/09/2006  . OSTEOPOROSIS 07/09/2006  . PAROXYSMAL ATRIAL FIBRILLATION 09/30/2009  . PVD (peripheral vascular disease)     moderate R and severe L subclavian artery stenosis  . CAD (coronary artery disease)     minimal, nonobstructive  . Hx of colonoscopy   . Left ventricular outflow tract obstruction     due to dynamic obstruction with LVH   Past Surgical History  Procedure Date  . Two para two     abortus zero  . Tonsillectomy   . 2-d echo 12/2005    revealed normal LV function and size.  No wall motion at around his  moderate mitral annular calcification.  Elevated LVOT gradient of 22 thought secondary to narrow outflow  tract aortic valve appeared to open well  . Low risk cardiolite 12/2005  . Coronary angioplasty 11/2075    Current Outpatient Prescriptions  Medication Sig Dispense Refill  . Calcium Carb-Cholecalciferol 600-400 MG-UNIT TABS Take by mouth 2 (two) times daily.       . dabigatran (PRADAXA) 75 MG CAPS Take 1 capsule (75 mg total) by mouth every 12 (twelve) hours.  180 capsule  1  . diltiazem (DILACOR XR) 240 MG 24 hr capsule Take 1 capsule (240 mg total) by mouth 2 (two) times daily.  180  capsule  3  . hydrochlorothiazide (HYDRODIURIL) 25 MG tablet Take 1 tablet (25 mg total) by mouth daily.  90 tablet  3  . lisinopril (PRINIVIL,ZESTRIL) 20 MG tablet Take 1 tablet (20 mg total) by mouth daily.  90 tablet  3  . Multiple Vitamin (MULTIVITAMIN) tablet Take 1 tablet by mouth daily.        Marland Kitchen omeprazole (PRILOSEC) 20 MG capsule Take 1 capsule (20 mg total) by mouth daily.  90 capsule  3  . rosuvastatin (CRESTOR) 5 MG tablet Take 1 tablet (5 mg total) by mouth daily.  90 tablet  2  . nitroGLYCERIN (NITROSTAT) 0.4 MG SL tablet Place 1 tablet (0.4 mg total) under the tongue every 5 (five) minutes as needed.  30 tablet  6    Allergies  Allergen Reactions  . Sulfamethoxazole     REACTION: unspecified    History   Social History  . Marital Status: Married    Spouse Name: N/A    Number of Children: N/A  . Years of Education: N/A   Occupational History  . Not on file.   Social History Main Topics  . Smoking status: Never Smoker   . Smokeless tobacco: Never Used  . Alcohol Use: No  . Drug Use: No  . Sexually Active: Not on file   Other Topics Concern  . Not on file   Social History Narrative  . No narrative on  file    Family History  Problem Relation Age of Onset  . Coronary artery disease Mother   . Heart attack Mother   . Colon cancer    . Lung cancer    . COPD    . Diabetes     Physical Exam: Filed Vitals:   06/22/11 1004  BP: 140/52  Pulse: 58  Resp: 18  Height: 5\' 1"  (1.549 m)  Weight: 139 lb (63.05 kg)    GEN- The patient is well appearing, alert and oriented x 3 today.   Head- normocephalic, atraumatic Eyes-  Sclera clear, conjunctiva pink Ears- hearing intact Oropharynx- clear Neck- supple, no JVP Lymph- no cervical lymphadenopathy Lungs- Clear to ausculation bilaterally, normal work of breathing Heart- Regular rate and rhythm,  2/6 SEM LUSB, early peaking, GI- soft, NT, ND, + BS Extremities- no clubbing, cyanosis, trace edema MS- age  appropriate muscle atrophy Skin- no rash or lesion Psych- euthymic mood, full affect Neuro- strength and sensation are intact  EKG today reveals sinus rhythm, LBBB  Assessment and Plan:

## 2011-06-22 NOTE — Patient Instructions (Signed)
Your physician wants you to follow-up in: 6 months with Norma Fredrickson, NP and 12 months with Dr Jacquiline Doe will receive a reminder letter in the mail two months in advance. If you don't receive a letter, please call our office to schedule the follow-up appointment.  Your physician recommends that you return for lab work in: FASTING lipid /liver/ CBC /BMP

## 2011-06-22 NOTE — Assessment & Plan Note (Signed)
No changes She declines referral to vascular surgery Given her advanced age, we will follow supportively

## 2011-06-22 NOTE — Assessment & Plan Note (Signed)
Asymptomatic  No changes today 

## 2011-06-22 NOTE — Assessment & Plan Note (Signed)
Doing well, without recent recurrence Continue pradaxa Check CBC and BMET today

## 2011-06-22 NOTE — Assessment & Plan Note (Signed)
Improved No changes bmet

## 2011-06-22 NOTE — Assessment & Plan Note (Signed)
Support hose ordered

## 2011-06-22 NOTE — Assessment & Plan Note (Signed)
Will check fasting lipids and LFTs

## 2011-06-24 ENCOUNTER — Other Ambulatory Visit (INDEPENDENT_AMBULATORY_CARE_PROVIDER_SITE_OTHER): Payer: Medicare Other

## 2011-06-24 DIAGNOSIS — I4891 Unspecified atrial fibrillation: Secondary | ICD-10-CM | POA: Diagnosis not present

## 2011-06-24 DIAGNOSIS — I10 Essential (primary) hypertension: Secondary | ICD-10-CM

## 2011-06-24 LAB — BASIC METABOLIC PANEL
CO2: 24 mEq/L (ref 19–32)
Chloride: 106 mEq/L (ref 96–112)
Glucose, Bld: 94 mg/dL (ref 70–99)
Potassium: 4.8 mEq/L (ref 3.5–5.1)
Sodium: 139 mEq/L (ref 135–145)

## 2011-06-24 LAB — LIPID PANEL
HDL: 42.5 mg/dL (ref >39.00)
Total CHOL/HDL Ratio: 4
VLDL: 31 mg/dL (ref 0.0–40.0)

## 2011-06-24 LAB — HEPATIC FUNCTION PANEL: Total Bilirubin: 0.5 mg/dL (ref 0.3–1.2)

## 2011-07-27 ENCOUNTER — Telehealth: Payer: Self-pay | Admitting: Internal Medicine

## 2011-07-27 DIAGNOSIS — I4891 Unspecified atrial fibrillation: Secondary | ICD-10-CM

## 2011-07-27 NOTE — Telephone Encounter (Signed)
Left her a message to return my call.

## 2011-07-27 NOTE — Telephone Encounter (Signed)
Pt had been having trouble with low b/p and want to know if she can talk about changing her medication to see if that would help

## 2011-07-27 NOTE — Telephone Encounter (Signed)
Follow up on previous call:  Patient calling back to speak with Tresa Endo. Cell phone number attached to message.

## 2011-07-27 NOTE — Telephone Encounter (Signed)
F/u Patient returning nurse call she can be reached at [phone]

## 2011-07-27 NOTE — Telephone Encounter (Signed)
Spoke with patient she is going to 1/2 her Lisinopril dose to 10mg  daily and f/u with me on Mon with new readings.  Dr Kirtland Bouchard is the one who increased the dose but she insist that Dr Johney Frame agrees and follows her BP.  She is going to have her CBC redrawn here tomorrow

## 2011-07-28 ENCOUNTER — Other Ambulatory Visit (INDEPENDENT_AMBULATORY_CARE_PROVIDER_SITE_OTHER): Payer: Medicare Other

## 2011-07-28 DIAGNOSIS — I4891 Unspecified atrial fibrillation: Secondary | ICD-10-CM | POA: Diagnosis not present

## 2011-07-28 LAB — CBC WITH DIFFERENTIAL/PLATELET
Basophils Absolute: 0 10*3/uL (ref 0.0–0.1)
Eosinophils Absolute: 0 10*3/uL (ref 0.0–0.7)
HCT: 39 % (ref 36.0–46.0)
Hemoglobin: 12.9 g/dL (ref 12.0–15.0)
Lymphs Abs: 2.3 10*3/uL (ref 0.7–4.0)
MCHC: 33.2 g/dL (ref 30.0–36.0)
Monocytes Relative: 10.3 % (ref 3.0–12.0)
Neutro Abs: 3.6 10*3/uL (ref 1.4–7.7)
RDW: 13.6 % (ref 11.5–14.6)

## 2011-09-15 ENCOUNTER — Ambulatory Visit: Payer: Medicare Other | Admitting: Internal Medicine

## 2011-09-16 ENCOUNTER — Ambulatory Visit (INDEPENDENT_AMBULATORY_CARE_PROVIDER_SITE_OTHER): Payer: Medicare Other | Admitting: Internal Medicine

## 2011-09-16 ENCOUNTER — Encounter: Payer: Self-pay | Admitting: Internal Medicine

## 2011-09-16 VITALS — BP 120/62 | Wt 139.0 lb

## 2011-09-16 DIAGNOSIS — I6529 Occlusion and stenosis of unspecified carotid artery: Secondary | ICD-10-CM

## 2011-09-16 DIAGNOSIS — E785 Hyperlipidemia, unspecified: Secondary | ICD-10-CM

## 2011-09-16 DIAGNOSIS — I708 Atherosclerosis of other arteries: Secondary | ICD-10-CM

## 2011-09-16 DIAGNOSIS — M1990 Unspecified osteoarthritis, unspecified site: Secondary | ICD-10-CM | POA: Diagnosis not present

## 2011-09-16 DIAGNOSIS — I10 Essential (primary) hypertension: Secondary | ICD-10-CM

## 2011-09-16 DIAGNOSIS — I4892 Unspecified atrial flutter: Secondary | ICD-10-CM

## 2011-09-16 DIAGNOSIS — I771 Stricture of artery: Secondary | ICD-10-CM

## 2011-09-16 NOTE — Patient Instructions (Addendum)
Limit your sodium (Salt) intake  Return in 6 months for follow-up  

## 2011-09-16 NOTE — Progress Notes (Signed)
Subjective:    Patient ID: Melanie Whitehead, female    DOB: [birth year], 78 y.o.   MRN: [id number]  HPI  78 year old patient who is seen today for followup. She has a history of paroxysmal atrial for ablation. She remains on anticoagulation as well as diltiazem. She is doing quite well. She has a history also of left subclavian artery stenosis. Denies any ischemic symptoms. She has dyslipidemia and remains on Crestor 5 mg daily. Denies any cardiopulmonary complaints. She has history carotid artery stenosis and denies any focal neurological symptoms.  Past Medical History  Diagnosis Date  . BUNDLE BRANCH BLOCK, LEFT 07/09/2006  . CAROTID BRUIT, LEFT 07/08/2007  . COLONIC POLYPS, HX OF 07/09/2006  . DEGENERATIVE JOINT DISEASE 07/09/2006  . GERD 07/13/2008  . HYPERLIPIDEMIA 07/09/2006  . HYPERTENSION 07/09/2006  . OSTEOPOROSIS 07/09/2006  . PAROXYSMAL ATRIAL FIBRILLATION 09/30/2009  . PVD (peripheral vascular disease)     moderate R and severe L subclavian artery stenosis  . CAD (coronary artery disease)     minimal, nonobstructive  . Hx of colonoscopy   . Left ventricular outflow tract obstruction     due to dynamic obstruction with LVH    History   Social History  . Marital Status: Married    Spouse Name: N/A    Number of Children: N/A  . Years of Education: N/A   Occupational History  . Not on file.   Social History Main Topics  . Smoking status: Never Smoker   . Smokeless tobacco: Never Used  . Alcohol Use: No  . Drug Use: No  . Sexually Active: Not on file   Other Topics Concern  . Not on file   Social History Narrative  . No narrative on file    Past Surgical History  Procedure Date  . Two para two     abortus zero  . Tonsillectomy   . 2-d echo 12/2005    revealed normal LV function and size.  No wall motion at around his  moderate mitral annular calcification.  Elevated LVOT gradient of 22 thought secondary to narrow outflow  tract aortic valve appeared to open well    . Low risk cardiolite 12/2005  . Coronary angioplasty 11/2075    Family History  Problem Relation Age of Onset  . Coronary artery disease Mother   . Heart attack Mother   . Colon cancer    . Lung cancer    . COPD    . Diabetes      Allergies  Allergen Reactions  . Sulfamethoxazole     REACTION: unspecified    Current Outpatient Prescriptions on File Prior to Visit  Medication Sig Dispense Refill  . Calcium Carb-Cholecalciferol 600-400 MG-UNIT TABS Take by mouth 2 (two) times daily.       . dabigatran (PRADAXA) 75 MG CAPS Take 1 capsule (75 mg total) by mouth every 12 (twelve) hours.  180 capsule  1  . diltiazem (DILACOR XR) 240 MG 24 hr capsule Take 1 capsule (240 mg total) by mouth 2 (two) times daily.  180 capsule  3  . hydrochlorothiazide (HYDRODIURIL) 25 MG tablet Take 1 tablet (25 mg total) by mouth daily.  90 tablet  3  . lisinopril (PRINIVIL,ZESTRIL) 20 MG tablet Take 1 tablet (20 mg total) by mouth daily.  90 tablet  3  . Multiple Vitamin (MULTIVITAMIN) tablet Take 1 tablet by mouth daily.        . nitroGLYCERIN (NITROSTAT) 0.4 MG SL tablet Place 1  tablet (0.4 mg total) under the tongue every 5 (five) minutes as needed.  30 tablet  6  . omeprazole (PRILOSEC) 20 MG capsule Take 1 capsule (20 mg total) by mouth daily.  90 capsule  3  . rosuvastatin (CRESTOR) 5 MG tablet Take 1 tablet (5 mg total) by mouth daily.  90 tablet  2    BP 120/62  Wt 139 lb (63.05 kg)      Review of Systems  Constitutional: Negative.   HENT: Negative for hearing loss, congestion, sore throat, rhinorrhea, dental problem, sinus pressure and tinnitus.   Eyes: Negative for pain, discharge and visual disturbance.  Respiratory: Negative for cough and shortness of breath.   Cardiovascular: Negative for chest pain, palpitations and leg swelling.  Gastrointestinal: Negative for nausea, vomiting, abdominal pain, diarrhea, constipation, blood in stool and abdominal distention.  Genitourinary:  Negative for dysuria, urgency, frequency, hematuria, flank pain, vaginal bleeding, vaginal discharge, difficulty urinating, vaginal pain and pelvic pain.  Musculoskeletal: Negative for joint swelling, arthralgias and gait problem.  Skin: Negative for rash.  Neurological: Negative for dizziness, syncope, speech difficulty, weakness, numbness and headaches.  Hematological: Negative for adenopathy.  Psychiatric/Behavioral: Negative for behavioral problems, dysphoric mood and agitation. The patient is not nervous/anxious.        Objective:   Physical Exam  Constitutional: She is oriented to person, place, and time. She appears well-developed and well-nourished.  HENT:  Head: Normocephalic.  Right Ear: External ear normal.  Left Ear: External ear normal.  Mouth/Throat: Oropharynx is clear and moist.  Eyes: Conjunctivae and EOM are normal. Pupils are equal, round, and reactive to light.  Neck: Normal range of motion. Neck supple. No thyromegaly present.       Loud left subclavian bruit  Cardiovascular: Normal rate, regular rhythm, normal heart sounds and intact distal pulses.   Pulmonary/Chest: Effort normal and breath sounds normal.  Abdominal: Soft. Bowel sounds are normal. She exhibits no mass. There is no tenderness.  Musculoskeletal: Normal range of motion.  Lymphadenopathy:    She has no cervical adenopathy.  Neurological: She is alert and oriented to person, place, and time.  Skin: Skin is warm and dry. No rash noted.  Psychiatric: She has a normal mood and affect. Her behavior is normal.          Assessment & Plan:   HTN HLD AFib   Meds RF  F/u Cardiology rov 6 mon

## 2011-09-28 ENCOUNTER — Telehealth: Payer: Self-pay | Admitting: Internal Medicine

## 2011-09-28 NOTE — Telephone Encounter (Signed)
Recall Project: Pt would like to speak with her cardiologist before scheduling an appointment. Stated she will call back

## 2011-09-29 DIAGNOSIS — H2510 Age-related nuclear cataract, unspecified eye: Secondary | ICD-10-CM | POA: Diagnosis not present

## 2011-09-29 DIAGNOSIS — H35379 Puckering of macula, unspecified eye: Secondary | ICD-10-CM | POA: Diagnosis not present

## 2011-09-30 ENCOUNTER — Telehealth: Payer: Self-pay | Admitting: Internal Medicine

## 2011-09-30 NOTE — Telephone Encounter (Signed)
New msg Pt is having cataract surgery she is having on Monday. Please fax to attn genie [phone]

## 2011-09-30 NOTE — Telephone Encounter (Signed)
She is due to have surgery Monday  I let her know we will have Dr Johney Frame approve on Thurs. And fax in

## 2011-10-01 NOTE — Telephone Encounter (Signed)
Ok to proceed with surgery. IF necessary to hold pradaxa, hold 3 doses prior to the procedure and resume 24 hours afterwards.

## 2011-10-01 NOTE — Telephone Encounter (Signed)
Faxed note.

## 2011-10-05 DIAGNOSIS — H538 Other visual disturbances: Secondary | ICD-10-CM | POA: Diagnosis not present

## 2011-10-05 DIAGNOSIS — H2510 Age-related nuclear cataract, unspecified eye: Secondary | ICD-10-CM | POA: Diagnosis not present

## 2011-10-05 DIAGNOSIS — I4891 Unspecified atrial fibrillation: Secondary | ICD-10-CM | POA: Diagnosis not present

## 2011-10-05 DIAGNOSIS — M129 Arthropathy, unspecified: Secondary | ICD-10-CM | POA: Diagnosis not present

## 2011-10-05 DIAGNOSIS — E7800 Pure hypercholesterolemia, unspecified: Secondary | ICD-10-CM | POA: Diagnosis not present

## 2011-10-05 DIAGNOSIS — Z79899 Other long term (current) drug therapy: Secondary | ICD-10-CM | POA: Diagnosis not present

## 2011-10-05 DIAGNOSIS — I10 Essential (primary) hypertension: Secondary | ICD-10-CM | POA: Diagnosis not present

## 2011-10-14 ENCOUNTER — Telehealth: Payer: Self-pay | Admitting: Internal Medicine

## 2011-10-14 MED ORDER — DILTIAZEM HCL ER 240 MG PO CP24: 240.0000 mg | ORAL_CAPSULE | Freq: Every day | ORAL | Status: DC

## 2011-10-14 MED ORDER — LISINOPRIL 20 MG PO TABS: 10.0000 mg | ORAL_TABLET | Freq: Every day | ORAL | Status: DC

## 2011-10-14 MED ORDER — HYDROCHLOROTHIAZIDE 25 MG PO TABS: 12.5000 mg | ORAL_TABLET | Freq: Every day | ORAL | Status: DC

## 2011-10-14 NOTE — Telephone Encounter (Signed)
Follow up: ° ° ° °Patient called in wanting to speak with you. Please call back. °

## 2011-10-14 NOTE — Telephone Encounter (Signed)
Discussed with Dr Johney Frame  We will decrease HCTZ to 12.5mg  daily and decrease the Diltiazem to 240mg  daily

## 2011-10-14 NOTE — Telephone Encounter (Signed)
New Problem:    Patient called in because she has fallen several times within the past month, her BP has been really low, she feels sluggish and would like to know if she needs to change her medications.  Please call back.

## 2011-10-28 ENCOUNTER — Other Ambulatory Visit: Payer: Self-pay | Admitting: Internal Medicine

## 2011-10-28 MED ORDER — LISINOPRIL 20 MG PO TABS: 20.0000 mg | ORAL_TABLET | Freq: Every day | ORAL | Status: DC

## 2011-10-28 NOTE — Telephone Encounter (Signed)
New Problem: ° ° ° °Patient called in wanting to speak with you about her medication.  Please call back. °

## 2011-10-28 NOTE — Telephone Encounter (Signed)
She is doing better after decreasing the Diltiazem  BP 150-160/60-70  Diltiazem 240mg  daily, Lisinopril 10mg  daily and HCTZ 12.5mg  daily.  She feels better with her HR up in the 50's now.  We will go back up on her Lisinopril to 20mg  daily and shw will keep a lof of her BP for me and let me know how it progresses

## 2011-11-02 DIAGNOSIS — H35349 Macular cyst, hole, or pseudohole, unspecified eye: Secondary | ICD-10-CM | POA: Diagnosis not present

## 2011-11-12 ENCOUNTER — Other Ambulatory Visit: Payer: Self-pay | Admitting: Internal Medicine

## 2011-11-12 MED ORDER — DABIGATRAN ETEXILATE MESYLATE 75 MG PO CAPS: 75.0000 mg | ORAL_CAPSULE | Freq: Two times a day (BID) | ORAL | Status: DC

## 2011-11-12 NOTE — Telephone Encounter (Signed)
Pt called and her dabigatran (PRADAXA) 75 MG capsules 1 bid, had not been received back to her [id number] [place].  Pt stated she was going out of town and needed samples, gave the pt 3 - boxes of dabigatran (PRADAXA) 75 MG capsules 1 bid, of qty 36, 21 day, 18 day supply.  Caralee Ates, CMA

## 2011-11-13 ENCOUNTER — Other Ambulatory Visit: Payer: Self-pay

## 2011-11-13 MED ORDER — DABIGATRAN ETEXILATE MESYLATE 75 MG PO CAPS: 75.0000 mg | ORAL_CAPSULE | Freq: Two times a day (BID) | ORAL | Status: DC

## 2011-11-23 DIAGNOSIS — Z23 Encounter for immunization: Secondary | ICD-10-CM | POA: Diagnosis not present

## 2011-12-21 ENCOUNTER — Encounter: Payer: Self-pay | Admitting: Nurse Practitioner

## 2011-12-21 ENCOUNTER — Ambulatory Visit (INDEPENDENT_AMBULATORY_CARE_PROVIDER_SITE_OTHER): Payer: Medicare Other | Admitting: Nurse Practitioner

## 2011-12-21 VITALS — BP 158/42 | HR 56 | Ht 61.0 in | Wt 138.6 lb

## 2011-12-21 DIAGNOSIS — I480 Paroxysmal atrial fibrillation: Secondary | ICD-10-CM

## 2011-12-21 DIAGNOSIS — R05 Cough: Secondary | ICD-10-CM

## 2011-12-21 DIAGNOSIS — I10 Essential (primary) hypertension: Secondary | ICD-10-CM | POA: Diagnosis not present

## 2011-12-21 DIAGNOSIS — R059 Cough, unspecified: Secondary | ICD-10-CM

## 2011-12-21 DIAGNOSIS — I4891 Unspecified atrial fibrillation: Secondary | ICD-10-CM | POA: Diagnosis not present

## 2011-12-21 NOTE — Progress Notes (Signed)
Melanie Whitehead Date of Birth: 23-Feb-1924 Medical Record #[id number]  History of Present Illness: Melanie Whitehead is seen back today for a 6 month check. She is seen for Dr. Johney Frame. She has HTN, PAF, LBBB, HLD, PVD and on chronic anticoagulation with Pradaxa.   She comes in today. She is here alone. Is doing ok. Says she is pretty much at her baseline. No chest pain. She has some dyspnea as a baseline. No worse but no better. Not as active. Has some restless legs at night and thinks it is due to Crestor. Wants to take Coenzyme Q10 with her statin. She has had a chronic dry cough and notes that she has to clear her throat very frequently. She thinks that is due to her ACE. Tries to watch her salt. Rhythm has been ok. Tolerating her Pradaxa without issue. Overall, she feels like she is doing ok.   Current Outpatient Prescriptions on File Prior to Visit  Medication Sig Dispense Refill  . Calcium Carb-Cholecalciferol 600-400 MG-UNIT TABS Take by mouth 2 (two) times daily.       . dabigatran (PRADAXA) 75 MG CAPS Take 1 capsule (75 mg total) by mouth every 12 (twelve) hours.  180 capsule  3  . diltiazem (DILACOR XR) 240 MG 24 hr capsule Take 1 capsule (240 mg total) by mouth daily.  180 capsule  3  . hydrochlorothiazide (HYDRODIURIL) 25 MG tablet Take 0.5 tablets (12.5 mg total) by mouth daily.  90 tablet  3  . lisinopril (PRINIVIL,ZESTRIL) 20 MG tablet Take 1 tablet (20 mg total) by mouth daily.  90 tablet  3  . Multiple Vitamin (MULTIVITAMIN) tablet Take 1 tablet by mouth daily.        . nitroGLYCERIN (NITROSTAT) 0.4 MG SL tablet Place 1 tablet (0.4 mg total) under the tongue every 5 (five) minutes as needed.  30 tablet  6  . omeprazole (PRILOSEC) 20 MG capsule Take 1 capsule (20 mg total) by mouth daily.  90 capsule  3  . rosuvastatin (CRESTOR) 5 MG tablet Take 1 tablet (5 mg total) by mouth daily.  90 tablet  2    Allergies  Allergen Reactions  . Sulfamethoxazole     REACTION: unspecified     Past Medical History  Diagnosis Date  . BUNDLE BRANCH BLOCK, LEFT 07/09/2006  . CAROTID BRUIT, LEFT 07/08/2007  . COLONIC POLYPS, HX OF 07/09/2006  . DEGENERATIVE JOINT DISEASE 07/09/2006  . GERD 07/13/2008  . HYPERLIPIDEMIA 07/09/2006  . HYPERTENSION 07/09/2006  . OSTEOPOROSIS 07/09/2006  . PAROXYSMAL ATRIAL FIBRILLATION 09/30/2009  . PVD (peripheral vascular disease)     moderate R and severe L subclavian artery stenosis  . CAD (coronary artery disease)     minimal, nonobstructive  . Hx of colonoscopy   . Left ventricular outflow tract obstruction     due to dynamic obstruction with LVH    Past Surgical History  Procedure Date  . Two para two     abortus zero  . Tonsillectomy   . 2-d echo 12/2005    revealed normal LV function and size.  No wall motion at around his  moderate mitral annular calcification.  Elevated LVOT gradient of 22 thought secondary to narrow outflow  tract aortic valve appeared to open well  . Low risk cardiolite 12/2005  . Coronary angioplasty 11/2075    History  Smoking status  . Never Smoker   Smokeless tobacco  . Never Used    History  Alcohol Use No  Family History  Problem Relation Age of Onset  . Coronary artery disease Mother   . Heart attack Mother   . Colon cancer    . Lung cancer    . COPD    . Diabetes      Review of Systems: The review of systems is per the HPI.  All other systems were reviewed and are negative.  Physical Exam: BP 158/42  Pulse 56  Ht 5\' 1"  (1.549 m)  Wt 138 lb 9.6 oz (62.869 kg)  BMI 26.19 kg/m2 Patient is very pleasant and in no acute distress. She looks younger than her stated age. Skin is warm and dry. Color is normal.  HEENT is unremarkable. Normocephalic/atraumatic. PERRL. Sclera are nonicteric. Neck is supple. No masses. No JVD. Lungs are clear. Cardiac exam shows a regular rate and rhythm. Abdomen is soft. Extremities are without edema. Gait and ROM are intact. No gross neurologic deficits  noted.  LABORATORY DATA: Lab Results  Component Value Date   WBC 6.6 07/28/2011   HGB 12.9 07/28/2011   HCT 39.0 07/28/2011   PLT 165.0 07/28/2011   GLUCOSE 94 06/24/2011   CHOL 152 06/24/2011   TRIG 155.0* 06/24/2011   HDL 42.50 06/24/2011   LDLDIRECT 101.7 07/15/2009   LDLCALC 79 06/24/2011   ALT 14 06/24/2011   AST 23 06/24/2011   NA 139 06/24/2011   K 4.8 06/24/2011   CL 106 06/24/2011   CREATININE 1.3* 06/24/2011   BUN 24* 06/24/2011   CO2 24 06/24/2011   TSH 2.11 07/13/2008   INR 1.0 ratio 11/14/2009    Assessment / Plan: 1. HTN - has probable ACE cough. Probably gets a little too much salt. I have given her samples of Benicar 40 mg to try - one each day. She will try this for 2 weeks. If her cough resolves, we can switch to Losartan 100 mg daily. If it persists, she can resume her Lisinopril.   2. PAF - doing well. Remains in sinus. On Pradaxa  Overall, she seems to be doing ok. We will see her back in 6 months. Encouraged activity and salt restriction as well.   Patient is agreeable to this plan and will call if any problems develop in the interim.

## 2011-12-21 NOTE — Patient Instructions (Addendum)
I think you are doing well.   It ok for you to take Coenzyme Q10 with your Crestor  Restrict your salt and try to stay active  We are going to stop your Lisinopril and try Benicar 40 mg a day. This may help your cough go away.   Call us if your cough resolves and we will call in the generic version of this  See Dr. Johney Frame in 6 months  Call the [facility] office at [phone] if you have any questions, problems or concerns.

## 2011-12-28 ENCOUNTER — Telehealth: Payer: Self-pay | Admitting: Nurse Practitioner

## 2011-12-28 NOTE — Telephone Encounter (Signed)
Can you call her?

## 2011-12-28 NOTE — Telephone Encounter (Signed)
plz return call to pt [phone] regarding Benicar  RX

## 2011-12-29 ENCOUNTER — Other Ambulatory Visit: Payer: Self-pay | Admitting: *Deleted

## 2011-12-29 MED ORDER — OLMESARTAN MEDOXOMIL 40 MG PO TABS: 40.0000 mg | ORAL_TABLET | Freq: Every day | ORAL | Status: DC

## 2011-12-29 NOTE — Telephone Encounter (Signed)
Follow-up:    Patient called in wanting to know if her Benicar had been called in.  Please call back.

## 2012-01-25 ENCOUNTER — Other Ambulatory Visit: Payer: Self-pay | Admitting: Internal Medicine

## 2012-01-25 MED ORDER — LOSARTAN POTASSIUM 100 MG PO TABS: 100.0000 mg | ORAL_TABLET | Freq: Every day | ORAL | Status: DC

## 2012-01-25 NOTE — Telephone Encounter (Signed)
If her cough has resolved since switching to the Benicar, can call in Losartan 100 mg a day.   If she is still coughing, she may resume her Lisinopril.

## 2012-01-25 NOTE — Telephone Encounter (Signed)
Pt would like to speak specifically with Melanie Whitehead. Regarding generic for Benicar medication. plz return call to hm# [phone]

## 2012-01-25 NOTE — Telephone Encounter (Signed)
t/w pt will be switching her to Losartan 100 mg daily pt agreed sent into [facility] pharmacy on [address]

## 2012-01-29 DIAGNOSIS — Z7901 Long term (current) use of anticoagulants: Secondary | ICD-10-CM | POA: Diagnosis not present

## 2012-01-29 DIAGNOSIS — I10 Essential (primary) hypertension: Secondary | ICD-10-CM | POA: Diagnosis not present

## 2012-01-29 DIAGNOSIS — Z8679 Personal history of other diseases of the circulatory system: Secondary | ICD-10-CM | POA: Diagnosis not present

## 2012-01-29 DIAGNOSIS — K219 Gastro-esophageal reflux disease without esophagitis: Secondary | ICD-10-CM | POA: Diagnosis not present

## 2012-02-12 DIAGNOSIS — Z0000 Encounter for general adult medical examination without abnormal findings: Secondary | ICD-10-CM | POA: Diagnosis not present

## 2012-02-18 DIAGNOSIS — Z7901 Long term (current) use of anticoagulants: Secondary | ICD-10-CM | POA: Diagnosis not present

## 2012-02-18 DIAGNOSIS — M810 Age-related osteoporosis without current pathological fracture: Secondary | ICD-10-CM | POA: Diagnosis not present

## 2012-02-18 DIAGNOSIS — N390 Urinary tract infection, site not specified: Secondary | ICD-10-CM | POA: Diagnosis not present

## 2012-02-18 DIAGNOSIS — K219 Gastro-esophageal reflux disease without esophagitis: Secondary | ICD-10-CM | POA: Diagnosis not present

## 2012-02-18 DIAGNOSIS — E7800 Pure hypercholesterolemia, unspecified: Secondary | ICD-10-CM | POA: Diagnosis not present

## 2012-02-18 DIAGNOSIS — I10 Essential (primary) hypertension: Secondary | ICD-10-CM | POA: Diagnosis not present

## 2012-02-18 DIAGNOSIS — A499 Bacterial infection, unspecified: Secondary | ICD-10-CM | POA: Diagnosis not present

## 2012-02-18 DIAGNOSIS — Z0000 Encounter for general adult medical examination without abnormal findings: Secondary | ICD-10-CM | POA: Diagnosis not present

## 2012-02-23 ENCOUNTER — Telehealth: Payer: Self-pay | Admitting: *Deleted

## 2012-02-23 MED ORDER — LOSARTAN POTASSIUM 100 MG PO TABS: 100.0000 mg | ORAL_TABLET | Freq: Every day | ORAL | Status: DC

## 2012-02-23 NOTE — Telephone Encounter (Signed)
See previous telephone note on 02/23/12

## 2012-02-23 NOTE — Telephone Encounter (Signed)
T/w pt today clarified w/ pt to be taking Losartan 100 mg daily instead of benicar, pt verbally understood, called into pharm

## 2012-02-23 NOTE — Telephone Encounter (Signed)
T/w pt clarified pt is to take losartan 100 mg daily instead of the benicar, pt verbally understood and was sent into pharmacy

## 2012-02-23 NOTE — Telephone Encounter (Signed)
Follow-up:    Patient called in still confused about what medication she should be picking up form the drug store.  Please call back.

## 2012-03-02 DIAGNOSIS — G2581 Restless legs syndrome: Secondary | ICD-10-CM | POA: Diagnosis not present

## 2012-03-02 DIAGNOSIS — M810 Age-related osteoporosis without current pathological fracture: Secondary | ICD-10-CM | POA: Diagnosis not present

## 2012-03-02 DIAGNOSIS — K219 Gastro-esophageal reflux disease without esophagitis: Secondary | ICD-10-CM | POA: Diagnosis not present

## 2012-03-02 DIAGNOSIS — M949 Disorder of cartilage, unspecified: Secondary | ICD-10-CM | POA: Diagnosis not present

## 2012-03-14 DIAGNOSIS — M810 Age-related osteoporosis without current pathological fracture: Secondary | ICD-10-CM | POA: Diagnosis not present

## 2012-03-17 ENCOUNTER — Ambulatory Visit: Payer: Medicare Other | Admitting: Internal Medicine

## 2012-03-24 ENCOUNTER — Ambulatory Visit: Payer: Medicare Other | Admitting: Internal Medicine

## 2012-05-04 DIAGNOSIS — H3530 Unspecified macular degeneration: Secondary | ICD-10-CM | POA: Diagnosis not present

## 2012-05-25 DIAGNOSIS — I10 Essential (primary) hypertension: Secondary | ICD-10-CM | POA: Diagnosis not present

## 2012-05-25 DIAGNOSIS — M25519 Pain in unspecified shoulder: Secondary | ICD-10-CM | POA: Diagnosis not present

## 2012-06-22 DIAGNOSIS — H6120 Impacted cerumen, unspecified ear: Secondary | ICD-10-CM | POA: Diagnosis not present

## 2012-06-22 DIAGNOSIS — I10 Essential (primary) hypertension: Secondary | ICD-10-CM | POA: Diagnosis not present

## 2012-06-27 ENCOUNTER — Encounter: Payer: Self-pay | Admitting: Internal Medicine

## 2012-06-27 ENCOUNTER — Ambulatory Visit (INDEPENDENT_AMBULATORY_CARE_PROVIDER_SITE_OTHER): Payer: Medicare Other | Admitting: Internal Medicine

## 2012-06-27 VITALS — BP 159/58 | HR 55 | Ht 60.0 in | Wt 140.0 lb

## 2012-06-27 DIAGNOSIS — E785 Hyperlipidemia, unspecified: Secondary | ICD-10-CM

## 2012-06-27 DIAGNOSIS — I10 Essential (primary) hypertension: Secondary | ICD-10-CM | POA: Diagnosis not present

## 2012-06-27 DIAGNOSIS — I4891 Unspecified atrial fibrillation: Secondary | ICD-10-CM | POA: Diagnosis not present

## 2012-06-27 DIAGNOSIS — I447 Left bundle-branch block, unspecified: Secondary | ICD-10-CM

## 2012-06-27 LAB — CBC WITH DIFFERENTIAL/PLATELET
Basophils Absolute: 0 10*3/uL (ref 0.0–0.1)
Eosinophils Relative: 0 % (ref 0.0–5.0)
Lymphocytes Relative: 30.5 % (ref 12.0–46.0)
Monocytes Relative: 9.8 % (ref 3.0–12.0)
Neutrophils Relative %: 59.5 % (ref 43.0–77.0)
Platelets: 145 10*3/uL — ABNORMAL LOW (ref 150.0–400.0)
RDW: 14.3 % (ref 11.5–14.6)
WBC: 6.6 10*3/uL (ref 4.5–10.5)

## 2012-06-27 LAB — BASIC METABOLIC PANEL
CO2: 23 mEq/L (ref 19–32)
Calcium: 9.7 mg/dL (ref 8.4–10.5)
Chloride: 108 mEq/L (ref 96–112)
Glucose, Bld: 63 mg/dL — ABNORMAL LOW (ref 70–99)
Potassium: 4.7 mEq/L (ref 3.5–5.1)
Sodium: 137 mEq/L (ref 135–145)

## 2012-06-27 NOTE — Patient Instructions (Signed)
Your physician wants you to follow-up in: 6 months with Sunday Spillers and 12 months with Dr Jacquiline Doe will receive a reminder letter in the mail two months in advance. If you don't receive a letter, please call our office to schedule the follow-up appointment.   Your physician recommends that you return today for labs: CBC/BMP

## 2012-06-27 NOTE — Progress Notes (Signed)
PCP: Londell Moh, MD  The patient presents today for routine cardiology followup.  Since last being seen in our clinic, the patient reports doing very well. She is unaware of any episodes of afib.  Her BP is better controlled. Today, she denies symptoms of palpitations, chest pain,  orthopnea, PND, dizziness, presyncope, syncope, or neurologic sequela.  She reports that she has occasional SOB if she "gets too busy".  She reports mild BLE edema.  The patient feels that she is tolerating medications without difficulties and is otherwise without complaint today.   Past Medical History  Diagnosis Date  . BUNDLE BRANCH BLOCK, LEFT 07/09/2006  . CAROTID BRUIT, LEFT 07/08/2007  . COLONIC POLYPS, HX OF 07/09/2006  . DEGENERATIVE JOINT DISEASE 07/09/2006  . GERD 07/13/2008  . HYPERLIPIDEMIA 07/09/2006  . HYPERTENSION 07/09/2006  . OSTEOPOROSIS 07/09/2006  . PAROXYSMAL ATRIAL FIBRILLATION 09/30/2009  . PVD (peripheral vascular disease)     moderate R and severe L subclavian artery stenosis  . CAD (coronary artery disease)     minimal, nonobstructive  . Hx of colonoscopy   . Left ventricular outflow tract obstruction     due to dynamic obstruction with LVH   Past Surgical History  Procedure Laterality Date  . Two para two      abortus zero  . Tonsillectomy    . 2-d echo  12/2005    revealed normal LV function and size.  No wall motion at around his  moderate mitral annular calcification.  Elevated LVOT gradient of 22 thought secondary to narrow outflow  tract aortic valve appeared to open well  . Low risk cardiolite  12/2005  . Coronary angioplasty  11/2075    Current Outpatient Prescriptions  Medication Sig Dispense Refill  . Biotin ([facility] BIOTIN HIGH POTENCY) 1000 MCG tablet Take 1,000 mcg by mouth daily.      . Calcium Carb-Cholecalciferol 600-400 MG-UNIT TABS Take by mouth 2 (two) times daily.       . Coenzyme Q10 (CO Q 10) 100 MG CAPS Take by mouth daily.      . dabigatran (PRADAXA) 75  MG CAPS Take 1 capsule (75 mg total) by mouth every 12 (twelve) hours.  180 capsule  3  . diltiazem (DILACOR XR) 240 MG 24 hr capsule Take 1 capsule (240 mg total) by mouth daily.  180 capsule  3  . losartan (COZAAR) 100 MG tablet Take 1 tablet (100 mg total) by mouth daily.  90 tablet  3  . Multiple Vitamin (MULTIVITAMIN) tablet Take 1 tablet by mouth daily.        . nitroGLYCERIN (NITROSTAT) 0.4 MG SL tablet Place 1 tablet (0.4 mg total) under the tongue every 5 (five) minutes as needed.  30 tablet  6  . pantoprazole (PROTONIX) 40 MG tablet Take 40 mg by mouth daily.      Marland Kitchen rOPINIRole (REQUIP) 0.5 MG tablet       . rosuvastatin (CRESTOR) 5 MG tablet Take 1 tablet (5 mg total) by mouth daily.  90 tablet  2  . valsartan-hydrochlorothiazide (DIOVAN-HCT) 320-25 MG per tablet        No current facility-administered medications for this visit.    Allergies  Allergen Reactions  . Sulfamethoxazole     REACTION: unspecified    History   Social History  . Marital Status: Married    Spouse Name: N/A    Number of Children: N/A  . Years of Education: N/A   Occupational History  . Not on  file.   Social History Main Topics  . Smoking status: Never Smoker   . Smokeless tobacco: Never Used  . Alcohol Use: No  . Drug Use: No  . Sexually Active: Not Currently   Other Topics Concern  . Not on file   Social History Narrative  . No narrative on file    Family History  Problem Relation Age of Onset  . Coronary artery disease Mother   . Heart attack Mother   . Colon cancer    . Lung cancer    . COPD    . Diabetes     Physical Exam: Filed Vitals:   06/27/12 0919  BP: 159/58  Pulse: 55  Height: 5' (1.524 m)  Weight: 140 lb (63.504 kg)    GEN- The patient is well appearing, alert and oriented x 3 today.   Head- normocephalic, atraumatic Eyes-  Sclera clear, conjunctiva pink Ears- hearing intact Oropharynx- clear Neck- supple, no JVP Lymph- no cervical  lymphadenopathy Lungs- Clear to ausculation bilaterally, normal work of breathing Heart- Regular rate and rhythm,  2/6 SEM LUSB, early peaking, GI- soft, NT, ND, + BS Extremities- no clubbing, cyanosis, no edema MS- age appropriate muscle atrophy Neuro- strength and sensation are intact  EKG today reveals sinus rhythm, LBBB  Assessment and Plan:  1. HTN JNC 8 goal 150/90 or below I have reviewed her home BP readings which reveals that she is at goal No changes today bmet today  2. afib Maintaining sinus rhythm Continue pradaxa She will follow with Virgina Evener at [facility] amd bmet today  3. HL Stable No change required today  4. LBBB Asymptomatic  Return to see Norma Fredrickson in 6 months I will see again in 1 year

## 2012-07-06 ENCOUNTER — Telehealth: Payer: Self-pay | Admitting: Internal Medicine

## 2012-07-06 ENCOUNTER — Other Ambulatory Visit: Payer: Self-pay | Admitting: Internal Medicine

## 2012-07-06 NOTE — Telephone Encounter (Signed)
New problem   Pt want to know if you have gotten a price for the medication Pradaxa.

## 2012-07-06 NOTE — Telephone Encounter (Signed)
Spoke with patient a 90 day supply has gone from $60 to over $400, she is going to call the pharmacy and go from there

## 2012-07-07 NOTE — Telephone Encounter (Signed)
Follow Up  Pt is returning the call from yesterday to let you know what the pharmacy and insurance company says about her medication

## 2012-07-07 NOTE — Telephone Encounter (Signed)
Pt is in the doughnut hole and her medication prices are increasing until 01/1013  I have left sample up front and patient is aware

## 2012-07-11 DIAGNOSIS — Z7901 Long term (current) use of anticoagulants: Secondary | ICD-10-CM | POA: Diagnosis not present

## 2012-07-11 DIAGNOSIS — I2510 Atherosclerotic heart disease of native coronary artery without angina pectoris: Secondary | ICD-10-CM | POA: Diagnosis not present

## 2012-07-11 DIAGNOSIS — I4891 Unspecified atrial fibrillation: Secondary | ICD-10-CM | POA: Diagnosis not present

## 2012-07-11 DIAGNOSIS — I10 Essential (primary) hypertension: Secondary | ICD-10-CM | POA: Diagnosis not present

## 2012-08-17 ENCOUNTER — Other Ambulatory Visit: Payer: Self-pay | Admitting: *Deleted

## 2012-08-17 MED ORDER — DABIGATRAN ETEXILATE MESYLATE 75 MG PO CAPS: 75.0000 mg | ORAL_CAPSULE | Freq: Two times a day (BID) | ORAL | Status: DC

## 2012-08-17 MED ORDER — DILTIAZEM HCL ER 240 MG PO CP24: 240.0000 mg | ORAL_CAPSULE | Freq: Every day | ORAL | Status: DC

## 2012-08-17 NOTE — Telephone Encounter (Signed)
Fax Received. Refill Completed. Melanie Whitehead (R.M.A)   

## 2012-09-08 ENCOUNTER — Other Ambulatory Visit: Payer: Self-pay | Admitting: Dermatology

## 2012-09-08 DIAGNOSIS — D485 Neoplasm of uncertain behavior of skin: Secondary | ICD-10-CM | POA: Diagnosis not present

## 2012-09-08 DIAGNOSIS — C44721 Squamous cell carcinoma of skin of unspecified lower limb, including hip: Secondary | ICD-10-CM | POA: Diagnosis not present

## 2012-09-15 DIAGNOSIS — M810 Age-related osteoporosis without current pathological fracture: Secondary | ICD-10-CM | POA: Diagnosis not present

## 2012-10-03 ENCOUNTER — Other Ambulatory Visit: Payer: Self-pay

## 2012-10-03 MED ORDER — NITROGLYCERIN 0.4 MG SL SUBL: 0.4000 mg | SUBLINGUAL_TABLET | SUBLINGUAL | Status: DC | PRN

## 2012-10-06 DIAGNOSIS — Z85828 Personal history of other malignant neoplasm of skin: Secondary | ICD-10-CM | POA: Diagnosis not present

## 2012-10-06 DIAGNOSIS — L821 Other seborrheic keratosis: Secondary | ICD-10-CM | POA: Diagnosis not present

## 2012-10-07 ENCOUNTER — Other Ambulatory Visit: Payer: Self-pay | Admitting: *Deleted

## 2012-10-07 MED ORDER — ROSUVASTATIN CALCIUM 5 MG PO TABS: ORAL_TABLET | ORAL | Status: DC

## 2012-10-26 DIAGNOSIS — H35349 Macular cyst, hole, or pseudohole, unspecified eye: Secondary | ICD-10-CM | POA: Diagnosis not present

## 2012-11-14 DIAGNOSIS — Z23 Encounter for immunization: Secondary | ICD-10-CM | POA: Diagnosis not present

## 2012-12-13 ENCOUNTER — Other Ambulatory Visit: Payer: Self-pay | Admitting: Dermatology

## 2012-12-13 DIAGNOSIS — Z85828 Personal history of other malignant neoplasm of skin: Secondary | ICD-10-CM | POA: Diagnosis not present

## 2012-12-13 DIAGNOSIS — D485 Neoplasm of uncertain behavior of skin: Secondary | ICD-10-CM | POA: Diagnosis not present

## 2012-12-13 DIAGNOSIS — C44721 Squamous cell carcinoma of skin of unspecified lower limb, including hip: Secondary | ICD-10-CM | POA: Diagnosis not present

## 2012-12-26 ENCOUNTER — Ambulatory Visit (INDEPENDENT_AMBULATORY_CARE_PROVIDER_SITE_OTHER): Payer: Medicare Other | Admitting: Nurse Practitioner

## 2012-12-26 ENCOUNTER — Encounter: Payer: Self-pay | Admitting: Nurse Practitioner

## 2012-12-26 VITALS — BP 150/60 | HR 66 | Ht 61.0 in | Wt 138.8 lb

## 2012-12-26 DIAGNOSIS — I480 Paroxysmal atrial fibrillation: Secondary | ICD-10-CM

## 2012-12-26 DIAGNOSIS — I4891 Unspecified atrial fibrillation: Secondary | ICD-10-CM | POA: Diagnosis not present

## 2012-12-26 DIAGNOSIS — I10 Essential (primary) hypertension: Secondary | ICD-10-CM

## 2012-12-26 LAB — CBC WITH DIFFERENTIAL/PLATELET
Basophils Absolute: 0 10*3/uL (ref 0.0–0.1)
Basophils Relative: 0.1 % (ref 0.0–3.0)
Eosinophils Absolute: 0.1 10*3/uL (ref 0.0–0.7)
Eosinophils Relative: 0.9 % (ref 0.0–5.0)
HCT: 36.2 % (ref 36.0–46.0)
Hemoglobin: 12.1 g/dL (ref 12.0–15.0)
Lymphocytes Relative: 29 % (ref 12.0–46.0)
Lymphs Abs: 2 10*3/uL (ref 0.7–4.0)
MCHC: 33.5 g/dL (ref 30.0–36.0)
MCV: 88.9 fl (ref 78.0–100.0)
Monocytes Absolute: 0.6 10*3/uL (ref 0.1–1.0)
Monocytes Relative: 8.3 % (ref 3.0–12.0)
Neutro Abs: 4.3 10*3/uL (ref 1.4–7.7)
Neutrophils Relative %: 61.7 % (ref 43.0–77.0)
Platelets: 198 10*3/uL (ref 150.0–400.0)
RBC: 4.07 Mil/uL (ref 3.87–5.11)
RDW: 13.6 % (ref 11.5–14.6)
WBC: 6.9 10*3/uL (ref 4.5–10.5)

## 2012-12-26 LAB — BASIC METABOLIC PANEL
BUN: 28 mg/dL — ABNORMAL HIGH (ref 6–23)
CO2: 25 mEq/L (ref 19–32)
Calcium: 9.7 mg/dL (ref 8.4–10.5)
Chloride: 104 mEq/L (ref 96–112)
Creatinine, Ser: 1.4 mg/dL — ABNORMAL HIGH (ref 0.4–1.2)
GFR: 38.17 mL/min — ABNORMAL LOW (ref >60.00)
Glucose, Bld: 158 mg/dL — ABNORMAL HIGH (ref 70–99)
Potassium: 4.4 mEq/L (ref 3.5–5.1)
Sodium: 136 mEq/L (ref 135–145)

## 2012-12-26 NOTE — Progress Notes (Signed)
Melanie Whitehead Date of Birth: 1924/09/25 Medical Record #[id number]  History of Present Illness: Melanie Whitehead is seen back today for a 6 month check. Seen for Dr. Johney Frame. She has HTN, PAF, chronic anticoagulation with Pradaxa, HLD and LBBB.   Last seen here in May and was doing ok.   Comes back today. Here alone. Doing ok. Not dizzy or lightheaded. No chest pain. Tries to be active. Does get short of breath if she hurries or bends over. Relieved with rest. Some burning in her throat sometimes - feels like it is reflux. No exertional symptoms. No passing out. No palpitations reported.   Current Outpatient Prescriptions  Medication Sig Dispense Refill  . Biotin ([employer] BIOTIN HIGH POTENCY) 1000 MCG tablet Take 1,000 mcg by mouth daily.      . Calcium Carb-Cholecalciferol 600-400 MG-UNIT TABS Take by mouth 2 (two) times daily.       . Coenzyme Q10 (CO Q 10) 100 MG CAPS Take by mouth daily.      . dabigatran (PRADAXA) 75 MG CAPS Take 1 capsule (75 mg total) by mouth every 12 (twelve) hours.  180 capsule  3  . denosumab (PROLIA) 60 MG/ML SOLN injection Inject 60 mg into the skin every 6 (six) months. Administer in upper arm, thigh, or abdomen      . diltiazem (DILACOR XR) 240 MG 24 hr capsule Take 1 capsule (240 mg total) by mouth daily.  90 capsule  3  . Multiple Vitamin (MULTIVITAMIN) tablet Take 1 tablet by mouth daily.        . nitroGLYCERIN (NITROSTAT) 0.4 MG SL tablet Place 1 tablet (0.4 mg total) under the tongue every 5 (five) minutes as needed.  25 tablet  3  . pantoprazole (PROTONIX) 40 MG tablet Take 40 mg by mouth daily.      Marland Kitchen rOPINIRole (REQUIP) 0.5 MG tablet       . rosuvastatin (CRESTOR) 5 MG tablet TAKE ONE TABLET BY MOUTH EVERY DAY  90 tablet  1  . valsartan-hydrochlorothiazide (DIOVAN-HCT) 320-25 MG per tablet        No current facility-administered medications for this visit.    Allergies  Allergen Reactions  . Sulfamethoxazole     REACTION: unspecified    Past Medical  History  Diagnosis Date  . BUNDLE BRANCH BLOCK, LEFT 07/09/2006  . CAROTID BRUIT, LEFT 07/08/2007  . COLONIC POLYPS, HX OF 07/09/2006  . DEGENERATIVE JOINT DISEASE 07/09/2006  . GERD 07/13/2008  . HYPERLIPIDEMIA 07/09/2006  . HYPERTENSION 07/09/2006  . OSTEOPOROSIS 07/09/2006  . PAROXYSMAL ATRIAL FIBRILLATION 09/30/2009  . PVD (peripheral vascular disease)     moderate R and severe L subclavian artery stenosis  . CAD (coronary artery disease)     minimal, nonobstructive  . Hx of colonoscopy   . Left ventricular outflow tract obstruction     due to dynamic obstruction with LVH    Past Surgical History  Procedure Laterality Date  . Two para two      abortus zero  . Tonsillectomy    . 2-d echo  12/2005    revealed normal LV function and size.  No wall motion at around his  moderate mitral annular calcification.  Elevated LVOT gradient of 22 thought secondary to narrow outflow  tract aortic valve appeared to open well  . Low risk cardiolite  12/2005  . Coronary angioplasty  11/2075    History  Smoking status  . Never Smoker   Smokeless tobacco  . Never Used  History  Alcohol Use No    Family History  Problem Relation Age of Onset  . Coronary artery disease Mother   . Heart attack Mother   . Colon cancer    . Lung cancer    . COPD    . Diabetes      Review of Systems: The review of systems is per the HPI.  All other systems were reviewed and are negative.  Physical Exam: BP 150/60  Pulse 66  Ht 5\' 1"  (1.549 m)  Wt 138 lb 12.8 oz (62.959 kg)  BMI 26.24 kg/m2  SpO2 98% Patient is very pleasant elderly female who is in no acute distress. Skin is warm and dry. Color is normal.  HEENT is unremarkable. Normocephalic/atraumatic. PERRL. Sclera are nonicteric. Neck is supple. No masses. No JVD. Lungs are clear. Cardiac exam shows a regular rate and rhythm. Harsh outflow murmur noted. Abdomen is soft. Extremities are without edema. Gait and ROM are intact. No gross neurologic  deficits noted.  LABORATORY DATA:  Lab Results  Component Value Date   WBC 6.6 06/27/2012   HGB 13.1 06/27/2012   HCT 38.5 06/27/2012   PLT 145.0* 06/27/2012   GLUCOSE 63* 06/27/2012   CHOL 152 06/24/2011   TRIG 155.0* 06/24/2011   HDL 42.50 06/24/2011   LDLDIRECT 101.7 07/15/2009   LDLCALC 79 06/24/2011   ALT 14 06/24/2011   AST 23 06/24/2011   NA 137 06/27/2012   K 4.7 06/27/2012   CL 108 06/27/2012   CREATININE 1.2 06/27/2012   BUN 22 06/27/2012   CO2 23 06/27/2012   TSH 2.11 07/13/2008   INR 1.0 ratio 11/14/2009   Echo Study Conclusions from November 2012  - Left ventricle: The cavity size was normal. There was moderate concentric hypertrophy. Systolic function was vigorous. The estimated ejection fraction was in the range of 65% to 70%. Wall motion was normal; there were no regional wall motion abnormalities. - Aortic valve: Mild regurgitation. - Mitral valve: Calcified annulus. There was moderate systolic anterior motion of the anterior leaflet and posterior leaflet. Moderate regurgitation. Valve area by pressure half-time: 2.06cm^2. - Left atrium: The atrium was moderately to severely dilated. - Pulmonary arteries: Systolic pressure was mildly increased. PA peak pressure: 41mm Hg (S). - Pericardium, extracardiac: A trivial pericardial effusion was identified.   Assessment / Plan:  1. HTN - BP is ok - her readings from home show basically satisfactory control.   2. HLD  3. PAF - in sinus on exam - remains on her pradaxa - her BP and heart rate diary is reviewed. Some transient readings down in the high 40's - asymptomatic- she will continue to monitor.    4. Chronic anticoagulation - recheck her labs today.   Patient is agreeable to this plan and will call if any problems develop in the interim.   Rosalio Macadamia, RN, ANP-C [facility] [address] [place], [place]  [address]

## 2012-12-26 NOTE — Patient Instructions (Addendum)
Continue with your current medicines  We will check labs today  See Dr. Johney Frame in 6 months (May, 2015)  Call the [facility] office at [phone] if you have any questions, problems or concerns.

## 2013-02-09 DIAGNOSIS — Z9289 Personal history of other medical treatment: Secondary | ICD-10-CM

## 2013-02-09 HISTORY — DX: Personal history of other medical treatment: Z92.89

## 2013-02-14 DIAGNOSIS — E7800 Pure hypercholesterolemia, unspecified: Secondary | ICD-10-CM | POA: Diagnosis not present

## 2013-02-14 DIAGNOSIS — I10 Essential (primary) hypertension: Secondary | ICD-10-CM | POA: Diagnosis not present

## 2013-02-14 DIAGNOSIS — N390 Urinary tract infection, site not specified: Secondary | ICD-10-CM | POA: Diagnosis not present

## 2013-02-14 DIAGNOSIS — Z7901 Long term (current) use of anticoagulants: Secondary | ICD-10-CM | POA: Diagnosis not present

## 2013-02-14 DIAGNOSIS — Z0000 Encounter for general adult medical examination without abnormal findings: Secondary | ICD-10-CM | POA: Diagnosis not present

## 2013-02-17 ENCOUNTER — Encounter: Payer: Self-pay | Admitting: Internal Medicine

## 2013-02-17 DIAGNOSIS — I2510 Atherosclerotic heart disease of native coronary artery without angina pectoris: Secondary | ICD-10-CM | POA: Diagnosis not present

## 2013-02-17 DIAGNOSIS — I10 Essential (primary) hypertension: Secondary | ICD-10-CM | POA: Diagnosis not present

## 2013-02-17 DIAGNOSIS — Z1212 Encounter for screening for malignant neoplasm of rectum: Secondary | ICD-10-CM | POA: Diagnosis not present

## 2013-02-17 DIAGNOSIS — Z8679 Personal history of other diseases of the circulatory system: Secondary | ICD-10-CM | POA: Diagnosis not present

## 2013-02-17 DIAGNOSIS — I447 Left bundle-branch block, unspecified: Secondary | ICD-10-CM | POA: Diagnosis not present

## 2013-02-17 DIAGNOSIS — E7800 Pure hypercholesterolemia, unspecified: Secondary | ICD-10-CM | POA: Diagnosis not present

## 2013-02-17 DIAGNOSIS — R0789 Other chest pain: Secondary | ICD-10-CM | POA: Diagnosis not present

## 2013-02-20 ENCOUNTER — Encounter: Payer: Self-pay | Admitting: *Deleted

## 2013-02-22 ENCOUNTER — Ambulatory Visit: Payer: Medicare Other | Admitting: Cardiology

## 2013-02-22 ENCOUNTER — Encounter: Payer: Self-pay | Admitting: Cardiology

## 2013-02-24 ENCOUNTER — Encounter: Payer: Self-pay | Admitting: Nurse Practitioner

## 2013-02-24 ENCOUNTER — Ambulatory Visit (INDEPENDENT_AMBULATORY_CARE_PROVIDER_SITE_OTHER): Payer: Medicare Other | Admitting: Nurse Practitioner

## 2013-02-24 ENCOUNTER — Encounter (INDEPENDENT_AMBULATORY_CARE_PROVIDER_SITE_OTHER): Payer: Self-pay

## 2013-02-24 VITALS — BP 142/54 | HR 64 | Ht 60.0 in | Wt 138.8 lb

## 2013-02-24 DIAGNOSIS — R079 Chest pain, unspecified: Secondary | ICD-10-CM

## 2013-02-24 MED ORDER — DILTIAZEM HCL ER 240 MG PO CP24: 240.0000 mg | ORAL_CAPSULE | Freq: Every day | ORAL | Status: DC

## 2013-02-24 MED ORDER — VALSARTAN-HYDROCHLOROTHIAZIDE 320-25 MG PO TABS: 1.0000 | ORAL_TABLET | Freq: Every day | ORAL | Status: DC

## 2013-02-24 NOTE — Patient Instructions (Signed)
I think you are doing well  Stay on your current medicines  Stay active  See Dr. Rayann Heman back as planned  Call the [place] office at [phone] if you have any questions, problems or concerns.

## 2013-02-24 NOTE — Progress Notes (Signed)
Melanie Whitehead Date of Birth: 12-21-1924 Medical Record #[id number]  History of Present Illness: Melanie Whitehead is seen back today for a work in visit. Seen for Dr. Rayann Heman. Sheis 80 years of age. She has PAF, HTN, HLD, LBBB and is maintained on chronic Pradaxa. She had a cardiac cath back in 2011 that showed 30 to 40% LAD stenosis - otherwise with just some irregularities - managed medically. EF was 70 to 75%.   Last seen here back in November - was felt to be doing ok.   Comes back today. Here alone. Referred back by PCP. Says she was there last week for her physical. Got an EKG. Continues to have some shortness of breath if she really exerts. Will have some burning in her throat - mostly after eating breakfast. She has had these symptoms for several years - no worse - no change. Her PPI was increased. She thought Dr. Shelia Media wanted her on more BP medicine. Her BP is averaging the 140's at home. She was bradycardic on her EKG but she has had no dizziness, lightheadedness or syncope. She thinks she continues to do well. She tries to stay active.   Current Outpatient Prescriptions  Medication Sig Dispense Refill  . Biotin ([employer] BIOTIN HIGH POTENCY) 1000 MCG tablet Take 1,000 mcg by mouth daily.      . Calcium Carb-Cholecalciferol 600-400 MG-UNIT TABS Take by mouth 2 (two) times daily.       . Coenzyme Q10 (CO Q 10) 100 MG CAPS Take by mouth daily.      . dabigatran (PRADAXA) 75 MG CAPS Take 1 capsule (75 mg total) by mouth every 12 (twelve) hours.  180 capsule  3  . denosumab (PROLIA) 60 MG/ML SOLN injection Inject 60 mg into the skin every 6 (six) months. Administer in upper arm, thigh, or abdomen      . diltiazem (DILACOR XR) 240 MG 24 hr capsule Take 1 capsule (240 mg total) by mouth daily.  90 capsule  3  . Multiple Vitamin (MULTIVITAMIN) tablet Take 1 tablet by mouth daily.        . nitroGLYCERIN (NITROSTAT) 0.4 MG SL tablet Place 1 tablet (0.4 mg total) under the tongue every 5 (five) minutes  as needed.  25 tablet  3  . pantoprazole (PROTONIX) 40 MG tablet Take 40 mg by mouth 2 (two) times daily.       Marland Kitchen rOPINIRole (REQUIP) 0.5 MG tablet       . rosuvastatin (CRESTOR) 5 MG tablet TAKE ONE TABLET BY MOUTH EVERY DAY  90 tablet  1  . valsartan-hydrochlorothiazide (DIOVAN-HCT) 320-25 MG per tablet        No current facility-administered medications for this visit.    Allergies  Allergen Reactions  . Sulfamethoxazole     REACTION: unspecified    Past Medical History  Diagnosis Date  . BUNDLE BRANCH BLOCK, LEFT 07/09/2006  . CAROTID BRUIT, LEFT 07/08/2007  . COLONIC POLYPS, HX OF 07/09/2006  . DEGENERATIVE JOINT DISEASE 07/09/2006  . GERD 07/13/2008  . HYPERLIPIDEMIA 07/09/2006  . HYPERTENSION 07/09/2006  . OSTEOPOROSIS 07/09/2006  . PAROXYSMAL ATRIAL FIBRILLATION 09/30/2009  . PVD (peripheral vascular disease)     moderate R and severe L subclavian artery stenosis  . CAD (coronary artery disease)     minimal, nonobstructive  . Hx of colonoscopy   . Left ventricular outflow tract obstruction     due to dynamic obstruction with LVH  . Mitral valve regurgitation   .  SCC (squamous cell carcinoma)   . Restless leg syndrome   . Osteopenia   . LVH (left ventricular hypertrophy)     Past Surgical History  Procedure Laterality Date  . Two para two      abortus zero  . Tonsillectomy    . 2-d echo  12/2005    revealed normal LV function and size.  No wall motion at around his  moderate mitral annular calcification.  Elevated LVOT gradient of 22 thought secondary to narrow outflow  tract aortic valve appeared to open well  . Low risk cardiolite  12/2005  . Coronary angioplasty  11/2075  . Cataract extraction Bilateral   . Glaucoma surgery    . Pars plana vitrectomy w/ repair of macular hole      History  Smoking status  . Never Smoker   Smokeless tobacco  . Never Used    History  Alcohol Use No    Family History  Problem Relation Age of Onset  . Coronary artery  disease Mother   . Heart attack Mother   . Colon cancer    . Lung cancer    . COPD    . Diabetes    . Stroke Father     Review of Systems: The review of systems is per the HPI.  All other systems were reviewed and are negative.  Physical Exam: BP 142/54  Pulse 64  Ht 5' (1.524 m)  Wt 138 lb 12.8 oz (62.959 kg)  BMI 27.11 kg/m2  SpO2 98% Patient is very pleasant and in no acute distress. She looks younger than her stated age.  Skin is warm and dry. Color is normal.  HEENT is unremarkable. Normocephalic/atraumatic. PERRL. Sclera are nonicteric. Neck is supple. No masses. No JVD. Lungs are clear. Cardiac exam shows a regular rate and rhythm. Harsh outflow murmur. Abdomen is soft. Extremities are without edema. Gait and ROM are intact. No gross neurologic deficits noted.  LABORATORY DATA: Reviewed from Dr. Pennie Banter office.    Lab Results  Component Value Date   WBC 6.9 12/26/2012   HGB 12.1 12/26/2012   HCT 36.2 12/26/2012   PLT 198.0 12/26/2012   GLUCOSE 158* 12/26/2012   CHOL 152 06/24/2011   TRIG 155.0* 06/24/2011   HDL 42.50 06/24/2011   LDLDIRECT 101.7 07/15/2009   LDLCALC 79 06/24/2011   ALT 14 06/24/2011   AST 23 06/24/2011   NA 136 12/26/2012   K 4.4 12/26/2012   CL 104 12/26/2012   CREATININE 1.4* 12/26/2012   BUN 28* 12/26/2012   CO2 25 12/26/2012   TSH 2.11 07/13/2008   INR 1.0 ratio 11/14/2009     Assessment / Plan:  1. Chest pain -  Has minimal nonobstructive CAD by cath back in 2011 - chronic LBBB on EKG - would continue with medical management - I think her symptoms are stable - not getting worse or changing. See her back as planned.   2. Chronic LBBB  3. HTN - BP is ok with her current regimen.   4. PAF - holding sinus by exam and recent EKG  Will see her back as planned. No change for now in her current therapy.   Patient is agreeable to this plan and will call if any problems develop in the interim.   Burtis Junes, RN, [place] [address] [place], [place]  [address] [phone]

## 2013-03-17 DIAGNOSIS — I2510 Atherosclerotic heart disease of native coronary artery without angina pectoris: Secondary | ICD-10-CM | POA: Diagnosis not present

## 2013-03-17 DIAGNOSIS — R079 Chest pain, unspecified: Secondary | ICD-10-CM | POA: Diagnosis not present

## 2013-03-17 DIAGNOSIS — I447 Left bundle-branch block, unspecified: Secondary | ICD-10-CM | POA: Diagnosis not present

## 2013-04-06 ENCOUNTER — Other Ambulatory Visit: Payer: Self-pay | Admitting: Internal Medicine

## 2013-04-11 ENCOUNTER — Ambulatory Visit: Payer: Medicare Other | Admitting: Internal Medicine

## 2013-04-11 DIAGNOSIS — T1510XA Foreign body in conjunctival sac, unspecified eye, initial encounter: Secondary | ICD-10-CM | POA: Diagnosis not present

## 2013-04-11 DIAGNOSIS — H35349 Macular cyst, hole, or pseudohole, unspecified eye: Secondary | ICD-10-CM | POA: Diagnosis not present

## 2013-04-28 ENCOUNTER — Telehealth: Payer: Self-pay | Admitting: Internal Medicine

## 2013-04-28 NOTE — Telephone Encounter (Signed)
New message     C/o heart rate 109-113.  No other symptoms.  Pt has to run errands----pls call after 4

## 2013-04-29 ENCOUNTER — Observation Stay (HOSPITAL_COMMUNITY)
Admission: EM | Admit: 2013-04-29 | Discharge: 2013-04-30 | Disposition: A | Discharge status: Home / Self Care | Payer: Medicare Other | Attending: Cardiology | Admitting: Cardiology

## 2013-04-29 ENCOUNTER — Emergency Department (HOSPITAL_COMMUNITY): Payer: Medicare Other

## 2013-04-29 ENCOUNTER — Telehealth: Payer: Self-pay | Admitting: Physician Assistant

## 2013-04-29 ENCOUNTER — Encounter (HOSPITAL_COMMUNITY): Payer: Self-pay | Admitting: Emergency Medicine

## 2013-04-29 DIAGNOSIS — M810 Age-related osteoporosis without current pathological fracture: Secondary | ICD-10-CM | POA: Diagnosis not present

## 2013-04-29 DIAGNOSIS — K219 Gastro-esophageal reflux disease without esophagitis: Secondary | ICD-10-CM | POA: Insufficient documentation

## 2013-04-29 DIAGNOSIS — I059 Rheumatic mitral valve disease, unspecified: Secondary | ICD-10-CM | POA: Diagnosis not present

## 2013-04-29 DIAGNOSIS — I447 Left bundle-branch block, unspecified: Secondary | ICD-10-CM

## 2013-04-29 DIAGNOSIS — E785 Hyperlipidemia, unspecified: Secondary | ICD-10-CM | POA: Insufficient documentation

## 2013-04-29 DIAGNOSIS — R011 Cardiac murmur, unspecified: Secondary | ICD-10-CM | POA: Insufficient documentation

## 2013-04-29 DIAGNOSIS — R059 Cough, unspecified: Secondary | ICD-10-CM | POA: Insufficient documentation

## 2013-04-29 DIAGNOSIS — J811 Chronic pulmonary edema: Secondary | ICD-10-CM | POA: Diagnosis not present

## 2013-04-29 DIAGNOSIS — I10 Essential (primary) hypertension: Secondary | ICD-10-CM | POA: Insufficient documentation

## 2013-04-29 DIAGNOSIS — Z872 Personal history of diseases of the skin and subcutaneous tissue: Secondary | ICD-10-CM | POA: Insufficient documentation

## 2013-04-29 DIAGNOSIS — I4891 Unspecified atrial fibrillation: Principal | ICD-10-CM | POA: Insufficient documentation

## 2013-04-29 DIAGNOSIS — R05 Cough: Secondary | ICD-10-CM | POA: Insufficient documentation

## 2013-04-29 DIAGNOSIS — I739 Peripheral vascular disease, unspecified: Secondary | ICD-10-CM | POA: Insufficient documentation

## 2013-04-29 DIAGNOSIS — Z79899 Other long term (current) drug therapy: Secondary | ICD-10-CM | POA: Insufficient documentation

## 2013-04-29 DIAGNOSIS — M1990 Unspecified osteoarthritis, unspecified site: Secondary | ICD-10-CM | POA: Insufficient documentation

## 2013-04-29 DIAGNOSIS — G2581 Restless legs syndrome: Secondary | ICD-10-CM | POA: Diagnosis not present

## 2013-04-29 DIAGNOSIS — I2510 Atherosclerotic heart disease of native coronary artery without angina pectoris: Secondary | ICD-10-CM | POA: Diagnosis not present

## 2013-04-29 DIAGNOSIS — I4460 Unspecified fascicular block: Secondary | ICD-10-CM | POA: Diagnosis not present

## 2013-04-29 LAB — I-STAT TROPONIN, ED: Troponin i, poc: 0.03 ng/mL (ref 0.00–0.08)

## 2013-04-29 LAB — BASIC METABOLIC PANEL
BUN: 21 mg/dL (ref 6–23)
CALCIUM: 10 mg/dL (ref 8.4–10.5)
CO2: 23 mEq/L (ref 19–32)
Chloride: 103 mEq/L (ref 96–112)
Creatinine, Ser: 1.45 mg/dL — ABNORMAL HIGH (ref 0.50–1.10)
GFR calc Af Amer: 37 mL/min — ABNORMAL LOW (ref >90)
GFR, EST NON AFRICAN AMERICAN: 32 mL/min — AB (ref >90)
Glucose, Bld: 124 mg/dL — ABNORMAL HIGH (ref 70–99)
Potassium: 4.2 mEq/L (ref 3.7–5.3)
Sodium: 140 mEq/L (ref 137–147)

## 2013-04-29 LAB — CBC
HEMATOCRIT: 36.8 % (ref 36.0–46.0)
HEMOGLOBIN: 12.5 g/dL (ref 12.0–15.0)
MCH: 30.3 pg (ref 26.0–34.0)
MCHC: 34 g/dL (ref 30.0–36.0)
MCV: 89.3 fL (ref 78.0–100.0)
Platelets: 155 10*3/uL (ref 150–400)
RBC: 4.12 MIL/uL (ref 3.87–5.11)
RDW: 14.2 % (ref 11.5–15.5)
WBC: 6 10*3/uL (ref 4.0–10.5)

## 2013-04-29 LAB — PRO B NATRIURETIC PEPTIDE: PRO B NATRI PEPTIDE: 6412 pg/mL — AB (ref 0–450)

## 2013-04-29 LAB — TROPONIN I: Troponin I: <0.3 ng/mL (ref <0.30)

## 2013-04-29 MED ORDER — PANTOPRAZOLE SODIUM 40 MG PO TBEC
40.0000 mg | DELAYED_RELEASE_TABLET | Freq: Two times a day (BID) | ORAL | Status: DC
  Administered 2013-04-29 – 2013-04-30 (×2): 40 mg via ORAL
  Filled 2013-04-29: qty 1

## 2013-04-29 MED ORDER — FUROSEMIDE 10 MG/ML IJ SOLN
20.0000 mg | Freq: Once | INTRAMUSCULAR | Status: AC
  Administered 2013-04-29: 20 mg via INTRAVENOUS

## 2013-04-29 MED ORDER — BIOTIN 1000 MCG PO TABS: 1000.0000 ug | ORAL_TABLET | Freq: Every day | ORAL | Status: DC

## 2013-04-29 MED ORDER — IRBESARTAN 150 MG PO TABS
150.0000 mg | ORAL_TABLET | Freq: Every day | ORAL | Status: DC
Start: 2013-04-30 — End: 2013-04-30
  Administered 2013-04-30: 150 mg via ORAL
  Filled 2013-04-29: qty 1

## 2013-04-29 MED ORDER — DABIGATRAN ETEXILATE MESYLATE 75 MG PO CAPS
75.0000 mg | ORAL_CAPSULE | Freq: Two times a day (BID) | ORAL | Status: DC
  Administered 2013-04-29 – 2013-04-30 (×2): 75 mg via ORAL
  Filled 2013-04-29 (×3): qty 1

## 2013-04-29 MED ORDER — FUROSEMIDE 10 MG/ML IJ SOLN
INTRAMUSCULAR | Status: AC
  Administered 2013-04-29: 20 mg via INTRAVENOUS
  Filled 2013-04-29: qty 4

## 2013-04-29 MED ORDER — DILTIAZEM HCL ER 240 MG PO CP24
240.0000 mg | ORAL_CAPSULE | Freq: Every day | ORAL | Status: DC
  Filled 2013-04-29: qty 1

## 2013-04-29 MED ORDER — ATORVASTATIN CALCIUM 10 MG PO TABS
10.0000 mg | ORAL_TABLET | Freq: Every day | ORAL | Status: DC
  Filled 2013-04-29: qty 1

## 2013-04-29 MED ORDER — DILTIAZEM HCL 25 MG/5ML IV SOLN
10.0000 mg | Freq: Once | INTRAVENOUS | Status: AC
  Administered 2013-04-29: 10 mg via INTRAVENOUS
  Filled 2013-04-29: qty 5

## 2013-04-29 MED ORDER — ROPINIROLE HCL 0.5 MG PO TABS
0.5000 mg | ORAL_TABLET | Freq: Every day | ORAL | Status: DC
  Administered 2013-04-29: 0.5 mg via ORAL
  Filled 2013-04-29 (×2): qty 1

## 2013-04-29 MED ORDER — FUROSEMIDE 10 MG/ML IJ SOLN
20.0000 mg | Freq: Once | INTRAMUSCULAR | Status: AC
  Administered 2013-04-29 (×2): 20 mg via INTRAVENOUS
  Filled 2013-04-29: qty 2

## 2013-04-29 MED ORDER — SODIUM CHLORIDE 0.9 % IV BOLUS (SEPSIS)
500.0000 mL | Freq: Once | INTRAVENOUS | Status: AC
  Administered 2013-04-29: 500 mL via INTRAVENOUS

## 2013-04-29 MED ORDER — ADULT MULTIVITAMIN W/MINERALS CH
1.0000 | ORAL_TABLET | Freq: Every day | ORAL | Status: DC
  Administered 2013-04-30: 1 via ORAL
  Filled 2013-04-29: qty 1

## 2013-04-29 MED ORDER — CALCIUM CARBONATE-VITAMIN D 500-200 MG-UNIT PO TABS
1.0000 | ORAL_TABLET | Freq: Two times a day (BID) | ORAL | Status: DC
  Administered 2013-04-29 – 2013-04-30 (×2): 1 via ORAL
  Filled 2013-04-29 (×3): qty 1

## 2013-04-29 NOTE — ED Notes (Signed)
Meal tray ordered 

## 2013-04-29 NOTE — ED Notes (Signed)
Waiting for patient to finish dinner tray before transferring pt to the floor.

## 2013-04-29 NOTE — Progress Notes (Signed)
Per D/w Dr. Percival Spanish to allow for possibility of titrating rate control, will d/c home HCTZ and change Valsartan to lower dose (160mg  daily) - the [facility] formulary for this is Irbesartan at 150mg  daily equivalent. Emaad Nanna PA-C

## 2013-04-29 NOTE — Progress Notes (Signed)
PHARMACIST - PHYSICIAN ORDER COMMUNICATION  CONCERNING: P&T Medication Policy on Herbal Medications  DESCRIPTION:  This patient's order for:  Biotin  has been noted.  This product(s) is classified as an "herbal" or natural product. Due to a lack of definitive safety studies or FDA approval, nonstandard manufacturing practices, plus the potential risk of unknown drug-drug interactions while on inpatient medications, the Pharmacy and Therapeutics Committee does not permit the use of "herbal" or natural products of this type within [facility].   ACTION TAKEN: The pharmacy department is unable to verify this order at this time and your patient has been informed of this safety policy. Please reevaluate patient's clinical condition at discharge and address if the herbal or natural product(s) should be resumed at that time.  Thanks,  Rober Minion, PharmD., MS Clinical Pharmacist Pager:  [phone] Thank you for allowing pharmacy to be part of this patients care team.

## 2013-04-29 NOTE — ED Provider Notes (Signed)
CSN: [id number]     Arrival date & time 04/29/13  1022 History   First MD Initiated Contact with Patient 04/29/13 1102     Chief Complaint  Patient presents with  . Atrial Fibrillation     (Consider location/radiation/quality/duration/timing/severity/associated sxs/prior Treatment) HPI Comments: Patient presents with palpitations. She has a history of atrial fibrillation. She's on Cardizem and digoxin. She sees Dr. Rayann Heman with cardiology. She hasn't had any episodes of atrial fibrillation in the last 4 years. She comes in today with a two-day history of palpitations. She has had some burning in her throat which she feels is her angina type symptoms. She's had this intermittently over the last couple months and uses a nitroglycerin patch which has improved the symptoms. She did have a slight increase in the burning as well as some shortness of breath this morning. She currently denies any burning in her throat or shortness of breath. She denies any leg swelling. She's had a mild nonproductive cough. She denies he fevers or chills. She denies any recent changes in medication.  Patient is a 80 y.o. female presenting with atrial fibrillation.  Atrial Fibrillation Associated symptoms include shortness of breath. Pertinent negatives include no chest pain, no abdominal pain and no headaches.    Past Medical History  Diagnosis Date  . BUNDLE BRANCH BLOCK, LEFT 07/09/2006  . CAROTID BRUIT, LEFT 07/08/2007  . COLONIC POLYPS, HX OF 07/09/2006  . DEGENERATIVE JOINT DISEASE 07/09/2006  . GERD 07/13/2008  . HYPERLIPIDEMIA 07/09/2006  . HYPERTENSION 07/09/2006  . OSTEOPOROSIS 07/09/2006  . PAROXYSMAL ATRIAL FIBRILLATION 09/30/2009  . PVD (peripheral vascular disease)     moderate R and severe L subclavian artery stenosis  . CAD (coronary artery disease)     minimal, nonobstructive  . Hx of colonoscopy   . Left ventricular outflow tract obstruction     due to dynamic obstruction with LVH  . Mitral valve  regurgitation   . SCC (squamous cell carcinoma)   . Restless leg syndrome   . Osteopenia   . LVH (left ventricular hypertrophy)    Past Surgical History  Procedure Laterality Date  . Two para two      abortus zero  . Tonsillectomy    . 2-d echo  12/2005    revealed normal LV function and size.  No wall motion at around his  moderate mitral annular calcification.  Elevated LVOT gradient of 22 thought secondary to narrow outflow  tract aortic valve appeared to open well  . Low risk cardiolite  12/2005  . Cataract extraction Bilateral   . Glaucoma surgery    . Pars plana vitrectomy w/ repair of macular hole     Family History  Problem Relation Age of Onset  . Coronary artery disease Mother     Died age 63  . Heart attack Mother   . Colon cancer    . Lung cancer    . COPD    . Diabetes    . Stroke Father    History  Substance Use Topics  . Smoking status: Never Smoker   . Smokeless tobacco: Never Used  . Alcohol Use: No   OB History   Grav Para Term Preterm Abortions TAB SAB Ect Mult Living                 Review of Systems  Constitutional: Negative for fever, chills, diaphoresis and fatigue.  HENT: Negative for congestion, rhinorrhea and sneezing.   Eyes: Negative.   Respiratory: Positive for  cough, chest tightness (burning in her throat) and shortness of breath.   Cardiovascular: Negative for chest pain and leg swelling.  Gastrointestinal: Negative for nausea, vomiting, abdominal pain, diarrhea and blood in stool.  Genitourinary: Negative for frequency, hematuria, flank pain and difficulty urinating.  Musculoskeletal: Negative for arthralgias and back pain.  Skin: Negative for rash.  Neurological: Negative for dizziness, speech difficulty, weakness, numbness and headaches.      Allergies  Sulfamethoxazole  Home Medications   Current Outpatient Rx  Name  Route  Sig  Dispense  Refill  . Biotin ([employer] BIOTIN HIGH POTENCY) 1000 MCG tablet   Oral   Take 1,000  mcg by mouth daily.         . Calcium Carb-Cholecalciferol 600-400 MG-UNIT TABS   Oral   Take 1 tablet by mouth 2 (two) times daily.          . Coenzyme Q10 (CO Q 10) 100 MG CAPS   Oral   Take by mouth daily.         . dabigatran (PRADAXA) 75 MG CAPS   Oral   Take 1 capsule (75 mg total) by mouth every 12 (twelve) hours.   180 capsule   3     PT WOULD LIKE MED IN A BOTTLE INSTEAD OF BUBBLE PA ...   . denosumab (PROLIA) 60 MG/ML SOLN injection   Subcutaneous   Inject 60 mg into the skin every 6 (six) months. Administer in upper arm, thigh, or abdomen         . Multiple Vitamin (MULTIVITAMIN) tablet   Oral   Take 1 tablet by mouth daily.           . nitroGLYCERIN (NITROSTAT) 0.4 MG SL tablet   Sublingual   Place 1 tablet (0.4 mg total) under the tongue every 5 (five) minutes as needed.   25 tablet   3   . rOPINIRole (REQUIP) 0.5 MG tablet   Oral   Take 0.5 mg by mouth at bedtime.          . rosuvastatin (CRESTOR) 5 MG tablet   Oral   Take 5 mg by mouth daily.         Marland Kitchen diltiazem (CARDIZEM CD) 300 MG 24 hr capsule   Oral   Take 1 capsule (300 mg total) by mouth daily.   30 capsule   5   . furosemide (LASIX) 20 MG tablet   Oral   Take 0.5 tablets (10 mg total) by mouth daily.   30 tablet   5   . pantoprazole (PROTONIX) 40 MG tablet   Oral   Take 40 mg by mouth 2 (two) times daily.          . valsartan (DIOVAN) 160 MG tablet   Oral   Take 1 tablet (160 mg total) by mouth daily.   30 tablet   5    BP 127/60  Pulse 105  Temp(Src) 97.2 F (36.2 C) (Oral)  Resp 18  Ht 5' (1.524 m)  Wt 138 lb 14.4 oz (63.005 kg)  BMI 27.13 kg/m2  SpO2 97% Physical Exam  Constitutional: She is oriented to person, place, and time. She appears well-developed and well-nourished.  HENT:  Head: Normocephalic and atraumatic.  Eyes: Pupils are equal, round, and reactive to light.  Neck: Normal range of motion. Neck supple.  Cardiovascular: An irregularly  irregular rhythm present. Tachycardia present.   Murmur heard. Pulmonary/Chest: Effort normal and breath sounds normal. No respiratory distress.  She has no wheezes. She has no rales. She exhibits no tenderness.  Abdominal: Soft. Bowel sounds are normal. There is no tenderness. There is no rebound and no guarding.  Musculoskeletal: Normal range of motion. She exhibits no edema.  Lymphadenopathy:    She has no cervical adenopathy.  Neurological: She is alert and oriented to person, place, and time.  Skin: Skin is warm and dry. No rash noted.  Psychiatric: She has a normal mood and affect.    ED Course  Procedures (including critical care time) Labs Review Labs Reviewed  BASIC METABOLIC PANEL - Abnormal; Notable for the following:    Glucose, Bld 124 (*)    Creatinine, Ser 1.45 (*)    GFR calc non Af Amer 32 (*)    GFR calc Af Amer 37 (*)    All other components within normal limits  PRO B NATRIURETIC PEPTIDE - Abnormal; Notable for the following:    Pro B Natriuretic peptide (BNP) 6412.0 (*)    All other components within normal limits  BASIC METABOLIC PANEL - Abnormal; Notable for the following:    Glucose, Bld 113 (*)    Creatinine, Ser 1.49 (*)    GFR calc non Af Amer 31 (*)    GFR calc Af Amer 35 (*)    All other components within normal limits  CBC  TSH  TROPONIN I  TROPONIN I  TROPONIN I  POTASSIUM  MAGNESIUM  I-STAT TROPOININ, ED   Imaging Review No results found.   EKG Interpretation   Date/Time:  Saturday April 29 2013 10:32:09 EDT Ventricular Rate:  111 PR Interval:    QRS Duration: 144 QT Interval:  386 QTC Calculation: 524 R Axis:   -23 Text Interpretation:  Atrial fibrillation with rapid ventricular response  Left bundle branch block Abnormal ECG LBBB noted on EKG from 02/17/13  Reconfirmed by Hitomi Slape  MD, Tiarrah Saville ([id number]) on 04/29/2013 11:05:11 AM      MDM   Final diagnoses:  Atrial fibrillation    PT given cardizem IV in the ED with good rate  control.  Admitted to cardiology.    Malvin Johns, MD 05/01/13 [phone]

## 2013-04-29 NOTE — ED Notes (Signed)
Dinner tray given to patient

## 2013-04-29 NOTE — H&P (Signed)
CARDIOLOGY ADMISSION NOTE  Patient ID: Melanie Whitehead MRN: [id number] DOB/AGE: 1924-05-31 80 y.o.  Admit date: 04/29/2013 Primary Physician   Horatio Pel, MD Primary Cardiologist   Dr. Rayann Heman Chief Complaint    Elevated heart rate  HPI:    The patient has a history of  PAF, HTN, HLD, and LBBB.  She is maintained on chronic Pradaxa. Cardiac cath in 2011 showed 30 to 40% LAD stenosis.  EF was 70 to 75%.   She presented to the ER with palpitations with burning in her throat.  In the ED today she was noted to be in atrial fibrillation.  Initial POC troponin was negative.    In the ED she was treated with a Cardizem bolus and Lasix IV x 1. The patient was advised to come to the emergency room and was brought there by her daughter who is a Music therapist. The patient reports that she's been having some lower blood pressures. Also her heart rate has been slightly higher. She hasn't otherwise felt particularly poorly. She's been a little bit weak. She had some mild dyspnea. She hasn't had any frank syncope or presyncope. She hasn't had any arm or chest discomfort though she's a little burning in her throat. This has been a problem she's had in the past and has been treated with pantoprazole. She did get nitroglycerin patch for her primary care doctor recently and thinks also this might have helped her throat burning. However, she hasn't had any increase in those symptoms in the last couple of days she's been feeling weaker. She was able to walk out in the park last Sunday without throat discomfort or shortness of breath. She's had no PND or orthopnea. She's had no weight gain or edema.   Past Medical History  Diagnosis Date  . BUNDLE BRANCH BLOCK, LEFT 07/09/2006  . CAROTID BRUIT, LEFT 07/08/2007  . COLONIC POLYPS, HX OF 07/09/2006  . DEGENERATIVE JOINT DISEASE 07/09/2006  . GERD 07/13/2008  . HYPERLIPIDEMIA 07/09/2006  . HYPERTENSION 07/09/2006  . OSTEOPOROSIS 07/09/2006  . PAROXYSMAL ATRIAL  FIBRILLATION 09/30/2009  . PVD (peripheral vascular disease)     moderate R and severe L subclavian artery stenosis  . CAD (coronary artery disease)     minimal, nonobstructive  . Hx of colonoscopy   . Left ventricular outflow tract obstruction     due to dynamic obstruction with LVH  . Mitral valve regurgitation   . SCC (squamous cell carcinoma)   . Restless leg syndrome   . Osteopenia   . LVH (left ventricular hypertrophy)     Past Surgical History  Procedure Laterality Date  . Two para two      abortus zero  . Tonsillectomy    . 2-d echo  12/2005    revealed normal LV function and size.  No wall motion at around his  moderate mitral annular calcification.  Elevated LVOT gradient of 22 thought secondary to narrow outflow  tract aortic valve appeared to open well  . Low risk cardiolite  12/2005  . Coronary angioplasty  11/2075  . Cataract extraction Bilateral   . Glaucoma surgery    . Pars plana vitrectomy w/ repair of macular hole      Allergies  Allergen Reactions  . Sulfamethoxazole     REACTION: unspecified   No current facility-administered medications on file prior to encounter.   Current Outpatient Prescriptions on File Prior to Encounter  Medication Sig Dispense Refill  . Biotin ([employer] BIOTIN HIGH POTENCY)  1000 MCG tablet Take 1,000 mcg by mouth daily.      . Calcium Carb-Cholecalciferol 600-400 MG-UNIT TABS Take 1 tablet by mouth 2 (two) times daily.       . Coenzyme Q10 (CO Q 10) 100 MG CAPS Take by mouth daily.      . dabigatran (PRADAXA) 75 MG CAPS Take 1 capsule (75 mg total) by mouth every 12 (twelve) hours.  180 capsule  3  . denosumab (PROLIA) 60 MG/ML SOLN injection Inject 60 mg into the skin every 6 (six) months. Administer in upper arm, thigh, or abdomen      . diltiazem (DILACOR XR) 240 MG 24 hr capsule Take 1 capsule (240 mg total) by mouth daily.  90 capsule  3  . Multiple Vitamin (MULTIVITAMIN) tablet Take 1 tablet by mouth daily.        . nitroGLYCERIN  (NITROSTAT) 0.4 MG SL tablet Place 1 tablet (0.4 mg total) under the tongue every 5 (five) minutes as needed.  25 tablet  3  . rOPINIRole (REQUIP) 0.5 MG tablet Take 0.5 mg by mouth at bedtime.       . valsartan-hydrochlorothiazide (DIOVAN-HCT) 320-25 MG per tablet Take 1 tablet by mouth daily.  90 tablet  3  . pantoprazole (PROTONIX) 40 MG tablet Take 40 mg by mouth 2 (two) times daily.        History   Social History  . Marital Status: Married    Spouse Name: N/A    Number of Children: N/A  . Years of Education: N/A   Occupational History  . Not on file.   Social History Main Topics  . Smoking status: Never Smoker   . Smokeless tobacco: Never Used  . Alcohol Use: No  . Drug Use: No  . Sexual Activity: Not Currently   Other Topics Concern  . Not on file   Social History Narrative  . No narrative on file    Family History  Problem Relation Age of Onset  . Coronary artery disease Mother   . Heart attack Mother   . Colon cancer    . Lung cancer    . COPD    . Diabetes    . Stroke Father      ROS:  As stated in the HPI and negative for all other systems.  Physical Exam: Blood pressure 114/55, pulse 59, temperature 97.9 F (36.6 C), temperature source Oral, resp. rate 16, height 5' (1.524 m), weight 135 lb 14.4 oz (61.644 kg), SpO2 99.00%.  GENERAL:  Well appearing HEENT:  Pupils equal round and reactive, fundi not visualized, oral mucosa unremarkable NECK:  No jugular venous distention, waveform within normal limits, carotid upstroke brisk and symmetric, no bruits, no thyromegaly LYMPHATICS:  No cervical, inguinal adenopathy LUNGS:  Bilateral few basilar crackles BACK:  No CVA tenderness CHEST:  Unremarkable HEART:  PMI not displaced or sustained,S1 and S2 within normal limits, no S3,  no clicks, no rubs, irregular, holosystolic apical murmur without change during Valsalva, no diastolic murmurs ABD:  Flat, positive bowel sounds normal in frequency in pitch, no  bruits, no rebound, no guarding, no midline pulsatile mass, no hepatomegaly, no splenomegaly EXT:  2 plus pulses throughout, no edema, no cyanosis no clubbing SKIN:  No rashes no nodules NEURO:  Cranial nerves II through XII grossly intact, motor grossly intact throughout PSYCH:  Cognitively intact, oriented to person place and time   Labs: Lab Results  Component Value Date   BUN 21 04/29/2013  Lab Results  Component Value Date   CREATININE 1.45* 04/29/2013   Lab Results  Component Value Date   NA 140 04/29/2013   K 4.2 04/29/2013   CL 103 04/29/2013   CO2 23 04/29/2013   No results found for this basename: TROPONINI   Lab Results  Component Value Date   WBC 6.0 04/29/2013   HGB 12.5 04/29/2013   HCT 36.8 04/29/2013   MCV 89.3 04/29/2013   PLT 155 04/29/2013   Radiology:  CXR:  Cardiac silhouette is enlarged. Atherosclerotic calcifications are  appreciated within the aorta. There is diffuse prominence of the  interstitial markings. Areas of peribronchial cuffing are  identified. Areas of mild increased density in the lung bases. There  is a component of flattening of the hemidiaphragms. No acute osseous  abnormalities.   EKG:  Atrial fib with LBBB, leftward axis.  Rate 123.  04/29/2013  ASSESSMENT AND PLAN:    ATRIAL FIB:  I don't have any idea how long she has been in this rhythm. He is likely contributing to some symptoms and perhaps a mild volume overload. Her rate has slowed with IV Cardizem x1. We will need to reassess in the a.m. to see if we should increase the usual by mouth dose. She will continue on Pradaxa. I will give her one more IV dose of Lasix tonight. For now I would continue with rate control and anticoagulation and Dr. Rayann Heman can then consider whether she needs rhythm control in the future.   I think that there is a very small chance that she would need cardioversion prior to discharge if she has rates that are difficult to control or her BP is still low.   HTN:   her blood pressure is actually low today. This will be the rate limiting step in titrating her meds for rate control.   THROAT PAIN:  Enzymes will be cycled. She should be set up for outpatient perfusion imaging if her enzymes are negative.   LBBB:  Chronic  Signed: Minus Breeding 04/29/2013, 2:49 PM

## 2013-04-29 NOTE — ED Notes (Signed)
Pt reports a history of atrial fibrillation, she is on pradaxa and diltiazem daily. This morning she felt like she was back in a-fib and had some burning in her throat. She applied her nitro patch to her L arm and came to the hospital.  Denies any pain now.

## 2013-04-29 NOTE — ED Notes (Signed)
Pt returned from Radiology.

## 2013-04-29 NOTE — Telephone Encounter (Signed)
Daughter called Saturday ans svc. Pt had elevated HR on Friday - 89 -115. It remains over 100 today and she c/o burning in her throat. Last episode of AF was about 4 yrs ago. Dtr is a Music therapist and gave her a SL NTG but pressure went to the 80's. Advised that best next step is to proceed to ER for further eval to make sure the burning in her throat does not represent MI, and to manage HR/rhythm in context of hypotension. Dtr agreed with this plan. Carlosdaniel Grob PA-C

## 2013-04-30 DIAGNOSIS — I4891 Unspecified atrial fibrillation: Secondary | ICD-10-CM | POA: Diagnosis not present

## 2013-04-30 LAB — BASIC METABOLIC PANEL
BUN: 22 mg/dL (ref 6–23)
CO2: 23 mEq/L (ref 19–32)
Calcium: 10.3 mg/dL (ref 8.4–10.5)
Chloride: 99 mEq/L (ref 96–112)
Creatinine, Ser: 1.49 mg/dL — ABNORMAL HIGH (ref 0.50–1.10)
GFR calc Af Amer: 35 mL/min — ABNORMAL LOW (ref >90)
GFR calc non Af Amer: 31 mL/min — ABNORMAL LOW (ref >90)
Glucose, Bld: 113 mg/dL — ABNORMAL HIGH (ref 70–99)
Potassium: 4.4 mEq/L (ref 3.7–5.3)
SODIUM: 137 meq/L (ref 137–147)

## 2013-04-30 LAB — TSH: TSH: 2.252 u[IU]/mL (ref 0.350–4.500)

## 2013-04-30 LAB — POTASSIUM: POTASSIUM: 4 meq/L (ref 3.7–5.3)

## 2013-04-30 LAB — MAGNESIUM: MAGNESIUM: 1.7 mg/dL (ref 1.5–2.5)

## 2013-04-30 LAB — TROPONIN I: Troponin I: <0.3 ng/mL (ref <0.30)

## 2013-04-30 MED ORDER — VALSARTAN 160 MG PO TABS: 160.0000 mg | ORAL_TABLET | Freq: Every day | ORAL | Status: DC

## 2013-04-30 MED ORDER — FUROSEMIDE 20 MG PO TABS: 10.0000 mg | ORAL_TABLET | Freq: Every day | ORAL | Status: DC

## 2013-04-30 MED ORDER — DILTIAZEM HCL ER 180 MG PO CP24
300.0000 mg | ORAL_CAPSULE | Freq: Every day | ORAL | Status: DC
  Administered 2013-04-30: 300 mg via ORAL
  Filled 2013-04-30: qty 1

## 2013-04-30 MED ORDER — DILTIAZEM HCL ER COATED BEADS 300 MG PO CP24: 300.0000 mg | ORAL_CAPSULE | Freq: Every day | ORAL | Status: DC

## 2013-04-30 NOTE — Discharge Instructions (Signed)
Get Lab work done [facility]) on Thursday 3/26

## 2013-04-30 NOTE — Discharge Summary (Signed)
Patient seen and examined.  Plan as discussed in my rounding note for today and outlined above. Jeneen Rinks [facility]  04/30/2013  10:09 AM

## 2013-04-30 NOTE — Discharge Summary (Signed)
Physician Discharge Summary  Patient ID: Melanie Whitehead MRN: [id number] DOB/AGE: 1924/03/10 80 y.o.  Admit date: 04/29/2013 Discharge date: 04/30/2013  Admission Diagnoses: Atrial Fibrillation w/ RVR  Discharge Diagnoses:  Active Problems:   PAROXYSMAL ATRIAL FIBRILLATION   Discharged Condition: stable  Hospital Course: The patient is 80 y/o female, followed by Dr. Rayann Heman, with a history of PAF, HTN, HLD, and LBBB. She is maintained on chronic Pradaxa. Cardiac cath in 2011 showed 30 to 40% LAD stenosis. EF was 70 to 75%.   She presented yesterday, 04/29/13, to the [facility] ER with a complaint of tachycardia. She denied SOB, CP, dizziness, syncope and near syncope. She was found to be in Afib w/ RVR (ventricular rate in the 120s) with evidence of slight volume overload on physical exam and was admitted IV rate control and IV diuretics. She was placed on IV Cardizem and was given a total of 40 mg IV Lasix. She was continued on Pradaxa. Cardiac enzymes were cycled and were negative x 3. Her ventricular rate improved overnight and she was transitioned back to PO Cardizem. It was increased from 240 mg to 300 mg, as she continued to have resting HRs in the upper 90s- low 100s. Her home HCTZ was discontinued and her Valsartan was decreased to 160 mg daily to allow for more room with BP to allow for the upward titration of her Cardizem. She tolerated the mediation adjustments well.   The following day, she was seen and examined by Dr. Percival Spanish, who determined she was stable for discharge home. In addition to the medication adjustments listed above, Dr. Percival Spanish ordered for her to go home on a low dose of PO Lasix, 10 mg daily. She has been ordered to get a BMP later this coming week. She is scheduled to see Dr. Rayann Heman in clinic on 05/08/13. Dr. Rayann Heman can decided whether she needs to have DCCV/antiarrhythmic therapy.    Consults: None   Significant Diagnostic Studies: None  Treatments: See Hospital  Course  Discharge Exam: Blood pressure 127/60, pulse 105, temperature 97.2 F (36.2 C), temperature source Oral, resp. rate 18, height 5' (1.524 m), weight 138 lb 14.4 oz (63.005 kg), SpO2 97.00%.   Disposition:       Discharge Orders   Future Appointments Provider Department Dept Phone   05/08/2013 2:15 PM Thompson Grayer, MD [place] [facility] [phone]   Future Orders Complete By Expires   Diet - low sodium heart healthy  As directed    Increase activity slowly  As directed        Medication List    STOP taking these medications       diltiazem 240 MG 24 hr capsule  Commonly known as:  DILACOR XR     valsartan-hydrochlorothiazide 320-25 MG per tablet  Commonly known as:  DIOVAN-HCT      TAKE these medications       Calcium Carb-Cholecalciferol 600-400 MG-UNIT Tabs  Take 1 tablet by mouth 2 (two) times daily.     Co Q 10 100 MG Caps  Take by mouth daily.     [employer] BIOTIN HIGH POTENCY 1000 MCG tablet  Generic drug:  Biotin  Take 1,000 mcg by mouth daily.     dabigatran 75 MG Caps capsule  Commonly known as:  PRADAXA  Take 1 capsule (75 mg total) by mouth every 12 (twelve) hours.     denosumab 60 MG/ML Soln injection  Commonly known as:  PROLIA  Inject 60 mg into  the skin every 6 (six) months. Administer in upper arm, thigh, or abdomen     diltiazem 300 MG 24 hr capsule  Commonly known as:  CARDIZEM CD  Take 1 capsule (300 mg total) by mouth daily.     furosemide 20 MG tablet  Commonly known as:  LASIX  Take 0.5 tablets (10 mg total) by mouth daily.     multivitamin tablet  Take 1 tablet by mouth daily.     nitroGLYCERIN 0.4 MG SL tablet  Commonly known as:  NITROSTAT  Place 1 tablet (0.4 mg total) under the tongue every 5 (five) minutes as needed.     pantoprazole 40 MG tablet  Commonly known as:  PROTONIX  Take 40 mg by mouth 2 (two) times daily.     rOPINIRole 0.5 MG tablet  Commonly known as:  REQUIP  Take 0.5 mg by mouth at  bedtime.     rosuvastatin 5 MG tablet  Commonly known as:  CRESTOR  Take 5 mg by mouth daily.     valsartan 160 MG tablet  Commonly known as:  DIOVAN  Take 1 tablet (160 mg total) by mouth daily.       Follow-up Information   Follow up with Thompson Grayer, MD On 05/08/2013. (2:15 pm. )    Specialty:  Cardiology   Contact information:   [address] [place] [address] [phone]      TIME SPENT ON DISCHARGE, INCLUDING PHYSICIAN TIME: >30 MINUTES  Signed: Lyda Jester 04/30/2013, 9:34 AM

## 2013-04-30 NOTE — Progress Notes (Signed)
The patient is 80 y/o female, followed by Dr. Rayann Heman, with a history of PAF, HTN, HLD, and LBBB. She is maintained on chronic Pradaxa. Cardiac cath in 2011 showed 30 to 40% LAD stenosis. EF was 70 to 75%. She presented yesterday, 04/29/13, with a complaint of tachycardia. She was found to be in Afib w/ RVR with evidence of slight volume overload on physical exam and was admitted IV rate control and IV diuretics.  Cardiac Enzymes are negative x 3.   Subjective: Completely asymptomatic, denying palpitations, SOB, dizziness, syncope /near syncope and CP. She is eager to go home today.  Objective: Vital signs in last 24 hours: Temp:  [97.2 F (36.2 C)-98.2 F (36.8 C)] 97.2 F (36.2 C) (03/22 0500) Pulse Rate:  [59-126] 105 (03/22 0500) Resp:  [16-30] 18 (03/22 0500) BP: (66-130)/(40-101) 127/60 mmHg (03/22 0500) SpO2:  [94 %-99 %] 97 % (03/22 0500) Weight:  [135 lb 14.4 oz (61.644 kg)-138 lb 14.4 oz (63.005 kg)] 138 lb 14.4 oz (63.005 kg) (03/22 0500) Last BM Date: 04/29/13  Intake/Output from previous day: 03/21 0701 - 03/22 0700 In: -  Out: 1300 [Urine:1300] Intake/Output this shift:    Medications Current Facility-Administered Medications  Medication Dose Route Frequency Provider Last Rate Last Dose  . atorvastatin (LIPITOR) tablet 10 mg  10 mg Oral q1800 Dayna N Dunn, PA-C      . calcium-vitamin D (OSCAL WITH D) 500-200 MG-UNIT per tablet 1 tablet  1 tablet Oral BID Charlie Pitter, PA-C   1 tablet at 04/29/13 2103  . dabigatran (PRADAXA) capsule 75 mg  75 mg Oral Q12H Dayna N Dunn, PA-C   75 mg at 04/29/13 2103  . diltiazem (DILACOR XR) 24 hr capsule 240 mg  240 mg Oral Daily Dayna N Dunn, PA-C      . irbesartan (AVAPRO) tablet 150 mg  150 mg Oral Daily Dayna N Dunn, PA-C      . multivitamin with minerals tablet 1 tablet  1 tablet Oral Daily Dayna N Dunn, PA-C      . pantoprazole (PROTONIX) EC tablet 40 mg  40 mg Oral BID Dayna N Dunn, PA-C   40 mg at 04/29/13 2102  .  rOPINIRole (REQUIP) tablet 0.5 mg  0.5 mg Oral QHS Dayna N Dunn, PA-C   0.5 mg at 04/29/13 2102    PE: General appearance: alert, cooperative and no distress Neck: no JVD Lungs: clear to auscultation bilaterally Heart: irregularly irregular rhythm Extremities: no LEE Pulses: 2+ and symmetric Skin: warm and dry Neurologic: Grossly normal  Lab Results:   Recent Labs  04/29/13 1055  WBC 6.0  HGB 12.5  HCT 36.8  PLT 155   BMET  Recent Labs  04/29/13 1055 04/30/13 0140 04/30/13 0415  NA 140  --  137  K 4.2 4.0 4.4  CL 103  --  99  CO2 23  --  23  GLUCOSE 124*  --  113*  BUN 21  --  22  CREATININE 1.45*  --  1.49*  CALCIUM 10.0  --  10.3   Cardiac Panel (last 3 results)  Recent Labs  04/29/13 1711 04/30/13 0140 04/30/13 0415  TROPONINI <0.30 <0.30 <0.30    Assessment/Plan    Active Problems:   PAROXYSMAL ATRIAL FIBRILLATION  Plan:  1. Atrial Fibrillation w/ RVR - IV Cardizem was discontinued overnight. PO, 240 mg ordered. She remains in atrial fibrillation but resting HR is still not adequately controlled. Resting HR is fluctuating from  the upper 90s-118. She is asymptomatic. BP is stable. Monitor response once PO Cardizem is given. If decent rate control, can d/c home today.   2. Volume Overload - likely subsequent to problem #1. She received a dose of IV Lasix on admit. Fluids -1.3L. She appears euvolemic on physical exam, w/o JVD, pulmonic rales and LEE.  3. Chronic Anticoagulation - continue on Pradaxa  Dispo: Home once stable from a cardiac standpoint  MD to follow with further recommendations.    LOS: 1 day    Brittainy M. Ladoris Gene 04/30/2013 7:20 AM   History and all data above reviewed.  Patient examined.  I agree with the findings as above.   She is feeling OK.  No acute SOB.  The patient exam reveals [id number]  ,  Lungs: Clear  ,  Abd: Positive bowel sounds, no rebound no guarding, Ext No edema  .  All available labs, radiology  testing, previous records reviewed. Agree with documented assessment and plan. I will increase the Cardizem to day to 300 mg.  I think that she can go home on that and a low dose of Lasix 10 mg.  I will like for her to get a BMET later this week.  She has an appt next Monday with Dr. Rayann Heman and he can decide whether she needs to have DCCV/antiarrhythmic therapy.    Minus Breeding  8:41 AM  04/30/2013

## 2013-04-30 NOTE — Progress Notes (Signed)
Utilization review completed.  

## 2013-05-01 NOTE — Telephone Encounter (Signed)
Tried several times on Fri but she was not at home.  She was out running errands.  She came back home and on Sat her HR was still up so she and her daughter went to the ER.  She was started on Furosemide and Cardizem was increased.  She has a follow up on 05/08/13 and will call before if needed

## 2013-05-02 ENCOUNTER — Encounter: Payer: Self-pay | Admitting: Nurse Practitioner

## 2013-05-02 ENCOUNTER — Other Ambulatory Visit: Payer: Self-pay | Admitting: *Deleted

## 2013-05-02 ENCOUNTER — Telehealth: Payer: Self-pay | Admitting: Internal Medicine

## 2013-05-02 ENCOUNTER — Encounter: Payer: Self-pay | Admitting: *Deleted

## 2013-05-02 ENCOUNTER — Ambulatory Visit (INDEPENDENT_AMBULATORY_CARE_PROVIDER_SITE_OTHER): Payer: Medicare Other | Admitting: Nurse Practitioner

## 2013-05-02 VITALS — BP 136/58 | HR 102 | Ht 60.0 in | Wt 135.0 lb

## 2013-05-02 DIAGNOSIS — I059 Rheumatic mitral valve disease, unspecified: Secondary | ICD-10-CM

## 2013-05-02 DIAGNOSIS — Z01810 Encounter for preprocedural cardiovascular examination: Secondary | ICD-10-CM

## 2013-05-02 DIAGNOSIS — R05 Cough: Secondary | ICD-10-CM | POA: Diagnosis not present

## 2013-05-02 DIAGNOSIS — I340 Nonrheumatic mitral (valve) insufficiency: Secondary | ICD-10-CM

## 2013-05-02 DIAGNOSIS — R059 Cough, unspecified: Secondary | ICD-10-CM

## 2013-05-02 DIAGNOSIS — I4891 Unspecified atrial fibrillation: Secondary | ICD-10-CM

## 2013-05-02 LAB — BASIC METABOLIC PANEL
BUN: 28 mg/dL — ABNORMAL HIGH (ref 6–23)
CALCIUM: 9.3 mg/dL (ref 8.4–10.5)
CO2: 27 mEq/L (ref 19–32)
Chloride: 97 mEq/L (ref 96–112)
Creatinine, Ser: 1.8 mg/dL — ABNORMAL HIGH (ref 0.4–1.2)
GFR: 27.59 mL/min — ABNORMAL LOW (ref >60.00)
Glucose, Bld: 114 mg/dL — ABNORMAL HIGH (ref 70–99)
Potassium: 4.7 mEq/L (ref 3.5–5.1)
Sodium: 132 mEq/L — ABNORMAL LOW (ref 135–145)

## 2013-05-02 LAB — PROTIME-INR
INR: 1.3 ratio — ABNORMAL HIGH (ref 0.8–1.0)
Prothrombin Time: 13.3 s — ABNORMAL HIGH (ref 10.2–12.4)

## 2013-05-02 LAB — APTT: aPTT: 33.6 s — ABNORMAL HIGH (ref 21.7–28.8)

## 2013-05-02 MED ORDER — AMIODARONE HCL 200 MG PO TABS: 200.0000 mg | ORAL_TABLET | Freq: Two times a day (BID) | ORAL | Status: DC

## 2013-05-02 MED ORDER — BENZONATATE 100 MG PO CAPS: 100.0000 mg | ORAL_CAPSULE | Freq: Two times a day (BID) | ORAL | Status: DC | PRN

## 2013-05-02 NOTE — Patient Instructions (Signed)
Your physician has recommended you make the following change in your medication:  BEGIN AMIODARONE 200 MG TWICE A DAY TESSALON 100 MG TWICE A DAY AS NEEDED FOR COUGH  Your physician has requested that you have an echocardiogram. Echocardiography is a painless test that uses sound waves to create images of your heart. It provides your doctor with information about the size and shape of your heart and how well your heart's chambers and valves are working. This procedure takes approximately one hour. There are no restrictions for this procedure.  Your physician has recommended that you have a Cardioversion (DCCV). Electrical Cardioversion uses a jolt of electricity to your heart either through paddles or wired patches attached to your chest. This is a controlled, usually prescheduled, procedure. Defibrillation is done under light anesthesia in the hospital, and you usually go home the day of the procedure. This is done to get your heart back into a normal rhythm. You are not awake for the procedure. Please see the instruction sheet given to you today.  Your physician recommends that you return for lab work TODAY (BMET, PT/INR, PTT)  Your physician recommends that you KEEP  follow-up appointment for 05/08/13.

## 2013-05-02 NOTE — Telephone Encounter (Signed)
New problem    Pt having sob per pt's daughter calling.

## 2013-05-02 NOTE — Progress Notes (Signed)
Patient Name: Melanie Whitehead Date of Encounter: 05/02/2013  Primary Care Provider:  Horatio Pel, MD Primary Cardiologist:  Lenna Sciara. Allred, MD   Patient Profile  80 year old female with history of paroxysmal atrial fibrillation who was recently admitted due to recurrent atrial fibrillation who presents today due to persistent fatigue.  Problem List   Past Medical History  Diagnosis Date  . BUNDLE BRANCH BLOCK, LEFT 07/09/2006  . CAROTID BRUIT, LEFT 07/08/2007  . COLONIC POLYPS, HX OF 07/09/2006  . DEGENERATIVE JOINT DISEASE 07/09/2006  . GERD 07/13/2008  . HYPERLIPIDEMIA 07/09/2006  . HYPERTENSION 07/09/2006  . OSTEOPOROSIS 07/09/2006  . PAROXYSMAL ATRIAL FIBRILLATION     a. Dx 09/2009;  b. chronic pradaxa;  c. recurrence 04/2013->amiodarone initiated.  Marland Kitchen PVD (peripheral vascular disease)     a. moderate R and severe L subclavian artery stenosis  . CAD (coronary artery disease)     a. 2011 Cath: minimal, nonobstructive dzs.  Marland Kitchen Hx of colonoscopy   . Left ventricular outflow tract obstruction     a. due to dynamic obstruction with LVH  . Moderate mitral regurgitation     a. 12/2010 Echo: EF 65-70%, mild AI, mod MR, mod-sev dil LA, PASP 53mmHg.  . SCC (squamous cell carcinoma)   . Restless leg syndrome   . Osteopenia   . LVH (left ventricular hypertrophy)    Past Surgical History  Procedure Laterality Date  . Two para two      abortus zero  . Tonsillectomy    . 2-d echo  12/2005    revealed normal LV function and size.  No wall motion at around his  moderate mitral annular calcification.  Elevated LVOT gradient of 22 thought secondary to narrow outflow  tract aortic valve appeared to open well  . Low risk cardiolite  12/2005  . Cataract extraction Bilateral   . Glaucoma surgery    . Pars plana vitrectomy w/ repair of macular hole      Allergies  Allergies  Allergen Reactions  . Sulfa Antibiotics   . Sulfamethoxazole     REACTION: unspecified    HPI  [name]  female with a prior history of paroxysmal fibrillation.  She has been maintained on pradaxa and oral diltiazem therapy over the years without any recurrence.  Unfortunately, over this past weekend she developed dyspnea and palpitations and presented to the ED where she was found to be in rapid A. Fib.  She was also felt to have mild volume overload and was treated with one dose of IV Lasix.  Her diltiazem dose was adjusted to 300 mg daily with relative rate control at rest though her daughter reports that her ambulatory heart rates were in the 130s.  She was discharged home on Sunday and Lasix 10 mg daily was also added to her regimen.  Since her discharge, she's continued to feel fatigued.  She is not noticing palpitations at this time but is short of breath with mild activity.  Last night, she was up most of the night coughing is new for her.  Her daughter called her this morning and she reported cough, fatigue, and mild dyspnea and her daughter contacted our office and she was added onto my schedule today.  Currently she has an intermittent cough but otherwise is not experiencing any chest pain, palpitations, or dyspnea.  She says she simply feels washed out.  She denies PND, orthopnea, dizziness, syncope, edema, or early satiety.  Home Medications  Prior to Admission  medications   Medication Sig Start Date End Date Taking? Authorizing Provider  Biotin ([employer] BIOTIN HIGH POTENCY) 1000 MCG tablet Take 1,000 mcg by mouth daily.   Yes Historical Provider, MD  Calcium Carb-Cholecalciferol 600-400 MG-UNIT TABS Take 1 tablet by mouth 2 (two) times daily.    Yes Historical Provider, MD  Coenzyme Q10 (CO Q 10) 100 MG CAPS Take by mouth daily.   Yes Historical Provider, MD  dabigatran (PRADAXA) 75 MG CAPS Take 1 capsule (75 mg total) by mouth every 12 (twelve) hours. 08/17/12  Yes Thompson Grayer, MD  denosumab (PROLIA) 60 MG/ML SOLN injection Inject 60 mg into the skin every 6 (six) months. Administer in upper arm,  thigh, or abdomen   Yes Historical Provider, MD  diltiazem (TIAZAC) 300 MG 24 hr capsule Take 300 mg by mouth daily.   Yes Historical Provider, MD  furosemide (LASIX) 20 MG tablet Take 0.5 tablets (10 mg total) by mouth daily. 04/30/13  Yes Brittainy Simmons, PA-C  Multiple Vitamin (MULTIVITAMIN) tablet Take 1 tablet by mouth daily.     Yes Historical Provider, MD  nitroGLYCERIN (NITROSTAT) 0.4 MG SL tablet Place 1 tablet (0.4 mg total) under the tongue every 5 (five) minutes as needed. 10/03/12  Yes Thompson Grayer, MD  rOPINIRole (REQUIP) 0.5 MG tablet Take 0.5 mg by mouth at bedtime.  05/25/12  Yes Historical Provider, MD  rosuvastatin (CRESTOR) 5 MG tablet Take 5 mg by mouth daily.   Yes Historical Provider, MD  amiodarone (PACERONE) 200 MG tablet Take 1 tablet (200 mg total) by mouth 2 (two) times daily. 05/02/13   Rogelia Mire, NP  benzonatate (TESSALON PERLES) 100 MG capsule Take 1 capsule (100 mg total) by mouth 2 (two) times daily as needed for cough. 05/02/13   Rogelia Mire, NP  diltiazem (CARDIZEM CD) 300 MG 24 hr capsule Take 1 capsule (300 mg total) by mouth daily. 04/30/13   Lyda Jester, PA-C    Family History  Family History  Problem Relation Age of Onset  . Coronary artery disease Mother     Died age 52  . Heart attack Mother   . Colon cancer    . Lung cancer    . COPD    . Diabetes    . Stroke Father     Social History  History   Social History  . Marital Status: Married    Spouse Name: N/A    Number of Children: N/A  . Years of Education: N/A   Occupational History  . Not on file.   Social History Main Topics  . Smoking status: Never Smoker   . Smokeless tobacco: Never Used  . Alcohol Use: No  . Drug Use: No  . Sexual Activity: Not Currently   Other Topics Concern  . Not on file   Social History Narrative  . No narrative on file     Review of Systems General:  General malaise.  Reduced exercise tolerance.  No chills, fever, night  sweats or weight changes.  Cardiovascular:  No chest pain, positive dyspnea on exertion, no edema, orthopnea, palpitations, paroxysmal nocturnal dyspnea. Dermatological: No rash, lesions/masses Respiratory: positive nonproductive cough, dyspnea Urologic: No hematuria, dysuria Abdominal:   No nausea, vomiting, diarrhea, bright red blood per rectum, melena, or hematemesis Neurologic:  No visual changes, wkns, changes in mental status. All other systems reviewed and are otherwise negative except as noted above.  Physical Exam  Blood pressure 136/58, pulse 102, height 5' (1.524 m), weight  135 lb (61.236 kg).  General: Pleasant, NAD Psych: Normal affect. Neuro: Alert and oriented X 3. Moves all extremities spontaneously. HEENT: Normal  Neck: Supple without bruits or JVD. Lungs:  Resp regular and unlabored, scattered rhonchi. Heart: irregularly irregular no s3, s4, 3/6 systolic murmur heard throughout. Abdomen: Soft, non-tender, non-distended, BS + x 4.  Extremities: No clubbing, cyanosis or edema. DP/PT/Radials 2+ and equal bilaterally.  Accessory Clinical Findings  ECG - atrial fibrillation, 102, left bundle branch block, no acute ST-T changes.  Assessment & Plan  1.  Atrial fibrillation with rapid ventricular response: Patient was recently hospitalized with recurrent A. Fib and RVR.  She had mild volume overload in that setting and his diuresed and her diltiazem was adjusted to 300 mg daily.  Since her discharge on Sunday, she's continued to feel weak and washed out and heart rates have been documented at home in the 130s at times.  Heart rate today at rest is 102.  I have discussed her case with Dr. Rayann Heman and we will plan to add amiodarone 200 mg b.i.d. to her regimen today.  I have arranged for an elective cardioversion to take place this Friday if she does not convert to sinus rhythm prior to that.  She did have TSH and LFTs when she was hospitalized over the weekend and will require  followup labs along with PFTs if she is to remain on amiodarone.  We will have her hold her diltiazem the morning of cardioversion.  2.  Moderate mitral regurgitation: Last echo was November 2012.  We will arrange for followup echocardiogram  3.  Chronic diastolic congestive heart failure: The patient was mildly volume overloaded in the setting of rapid atrial fibrillation during recent hospitalization.  She is currently on Lasix 10 mg daily and her volume status looks good.  We'll repeat a basic metabolic panel today.  4.  Cough:  Nonproductive.  Afebrile.  ? Mild bronchitis. Will add tessalon.  5.  Dispo:  DCCV Friday.  F/u echo for MR.  F/U Dr. Rayann Heman as scheduled.   Murray Hodgkins, NP 05/02/2013, 1:59 PM

## 2013-05-02 NOTE — Telephone Encounter (Signed)
Daughter states pt discharged from hospital on Sunday 04/30/13. Pt admitted with at fib with RVR.  Pt's cardizem increased from 240mg  to 300mg . HCTZ discontinued and lasix  10mg  daily added, valsartan decreased to 160mg  daily. Daughter states pt did OK until last night. Pt has been SOB and resting heart rate according to pt's BP monitor has been 120. Daughter concerned about HR continuing to be rapid and SOB, sheis requesting appt today. I have scheduled pt with Lucillie Garfinkel today at 12:15PM.

## 2013-05-05 ENCOUNTER — Ambulatory Visit (HOSPITAL_COMMUNITY)
Admission: RE | Admit: 2013-05-05 | Discharge: 2013-05-05 | Disposition: A | Discharge status: Home / Self Care | Payer: Medicare Other | Source: Ambulatory Visit | Attending: Cardiology | Admitting: Cardiology

## 2013-05-05 ENCOUNTER — Ambulatory Visit (HOSPITAL_COMMUNITY): Payer: Medicare Other | Admitting: Anesthesiology

## 2013-05-05 ENCOUNTER — Encounter (HOSPITAL_COMMUNITY)
Admission: RE | Disposition: A | Discharge status: Home / Self Care | Payer: Self-pay | Source: Ambulatory Visit | Attending: Cardiology

## 2013-05-05 ENCOUNTER — Encounter (HOSPITAL_COMMUNITY): Payer: Self-pay

## 2013-05-05 DIAGNOSIS — K219 Gastro-esophageal reflux disease without esophagitis: Secondary | ICD-10-CM | POA: Diagnosis not present

## 2013-05-05 DIAGNOSIS — M1990 Unspecified osteoarthritis, unspecified site: Secondary | ICD-10-CM | POA: Diagnosis not present

## 2013-05-05 DIAGNOSIS — M810 Age-related osteoporosis without current pathological fracture: Secondary | ICD-10-CM | POA: Diagnosis not present

## 2013-05-05 DIAGNOSIS — R0989 Other specified symptoms and signs involving the circulatory and respiratory systems: Secondary | ICD-10-CM | POA: Diagnosis not present

## 2013-05-05 DIAGNOSIS — Z7901 Long term (current) use of anticoagulants: Secondary | ICD-10-CM | POA: Insufficient documentation

## 2013-05-05 DIAGNOSIS — I059 Rheumatic mitral valve disease, unspecified: Secondary | ICD-10-CM | POA: Insufficient documentation

## 2013-05-05 DIAGNOSIS — I2510 Atherosclerotic heart disease of native coronary artery without angina pectoris: Secondary | ICD-10-CM | POA: Insufficient documentation

## 2013-05-05 DIAGNOSIS — Z860100 Personal history of colon polyps, unspecified: Secondary | ICD-10-CM | POA: Insufficient documentation

## 2013-05-05 DIAGNOSIS — I509 Heart failure, unspecified: Secondary | ICD-10-CM | POA: Insufficient documentation

## 2013-05-05 DIAGNOSIS — M899 Disorder of bone, unspecified: Secondary | ICD-10-CM | POA: Diagnosis not present

## 2013-05-05 DIAGNOSIS — I5032 Chronic diastolic (congestive) heart failure: Secondary | ICD-10-CM | POA: Diagnosis not present

## 2013-05-05 DIAGNOSIS — I10 Essential (primary) hypertension: Secondary | ICD-10-CM | POA: Insufficient documentation

## 2013-05-05 DIAGNOSIS — I447 Left bundle-branch block, unspecified: Secondary | ICD-10-CM | POA: Diagnosis not present

## 2013-05-05 DIAGNOSIS — G2581 Restless legs syndrome: Secondary | ICD-10-CM | POA: Diagnosis not present

## 2013-05-05 DIAGNOSIS — I739 Peripheral vascular disease, unspecified: Secondary | ICD-10-CM | POA: Insufficient documentation

## 2013-05-05 DIAGNOSIS — I4891 Unspecified atrial fibrillation: Secondary | ICD-10-CM | POA: Insufficient documentation

## 2013-05-05 DIAGNOSIS — E785 Hyperlipidemia, unspecified: Secondary | ICD-10-CM | POA: Insufficient documentation

## 2013-05-05 DIAGNOSIS — M949 Disorder of cartilage, unspecified: Secondary | ICD-10-CM

## 2013-05-05 HISTORY — PX: CARDIOVERSION: SHX1299

## 2013-05-05 HISTORY — DX: Heart failure, unspecified: I50.9

## 2013-05-05 SURGERY — CARDIOVERSION
Anesthesia: Monitor Anesthesia Care

## 2013-05-05 MED ORDER — LIDOCAINE HCL (CARDIAC) 20 MG/ML IV SOLN
INTRAVENOUS | Status: DC | PRN
  Administered 2013-05-05: 40 mg via INTRAVENOUS

## 2013-05-05 MED ORDER — SODIUM CHLORIDE 0.9 % IV SOLN
INTRAVENOUS | Status: DC
  Administered 2013-05-05: 500 mL via INTRAVENOUS

## 2013-05-05 MED ORDER — PROPOFOL 10 MG/ML IV BOLUS
INTRAVENOUS | Status: DC | PRN
  Administered 2013-05-05: 50 mg via INTRAVENOUS

## 2013-05-05 NOTE — Transfer of Care (Signed)
Immediate Anesthesia Transfer of Care Note  Patient: Melanie Whitehead  Procedure(s) Performed: Procedure(s): CARDIOVERSION (N/A)  Patient Location: Endoscopy Unit  Anesthesia Type:General  Level of Consciousness: awake, alert  and oriented  Airway & Oxygen Therapy: Patient Spontanous Breathing and Patient connected to nasal cannula oxygen  Post-op Assessment: Report given to PACU RN, Post -op Vital signs reviewed and stable and Patient moving all extremities  Post vital signs: Reviewed and stable  Complications: No apparent anesthesia complications

## 2013-05-05 NOTE — Interval H&P Note (Signed)
History and Physical Interval Note:  05/05/2013 11:06 AM  Melanie Whitehead  has presented today for surgery, with the diagnosis of A FIB  The various methods of treatment have been discussed with the patient and family. After consideration of risks, benefits and other options for treatment, the patient has consented to  Procedure(s): CARDIOVERSION (N/A) as a surgical intervention .  The patient's history has been reviewed, patient examined, no change in status, stable for surgery.  I have reviewed the patient's chart and labs.  Questions were answered to the patient's satisfaction.     [name]

## 2013-05-05 NOTE — Interval H&P Note (Signed)
History and Physical Interval Note:  05/05/2013 11:06 AM  Melanie Whitehead  has presented today for surgery, with the diagnosis of A FIB  The various methods of treatment have been discussed with the patient and family. After consideration of risks, benefits and other options for treatment, the patient has consented to  Procedure(s): CARDIOVERSION (N/A) as a surgical intervention .  The patient's history has been reviewed, patient examined, no change in status, stable for surgery.  I have reviewed the patient's chart and labs.  Questions were answered to the patient's satisfaction.     [name] H   

## 2013-05-05 NOTE — CV Procedure (Signed)
    Cardioversion Note  Melanie Whitehead [id number] 06/08/1924  Procedure: DC Cardioversion Indications: a fib with RVR  Procedure Details Consent: Obtained Time Out: Verified patient identification, verified procedure, site/side was marked, verified correct patient position, special equipment/implants available, Radiology Safety Procedures followed,  medications/allergies/relevent history reviewed, required imaging and test results available.  Performed  The patient has been on adequate anticoagulation.  The patient received IV propofol by anesthesia staff for sedation.  Synchronous cardioversion was performed at 120 joules.  The cardioversion was successful.   Complications: No apparent complications Patient did tolerate procedure well.   [name], MD, [facility] 05/05/2013, 11:07 AM

## 2013-05-05 NOTE — Anesthesia Preprocedure Evaluation (Signed)
Anesthesia Evaluation  Patient identified by MRN, date of birth, ID band Patient awake    Reviewed: Allergy & Precautions, H&P , NPO status , Patient's Chart, lab work & pertinent test results  Airway Mallampati: II    Mouth opening: Limited Mouth Opening  Dental  (+) Teeth Intact   Pulmonary shortness of breath,          Cardiovascular hypertension, + CAD, + Peripheral Vascular Disease and +CHF + dysrhythmias Atrial Fibrillation Rhythm:Irregular     Neuro/Psych    GI/Hepatic GERD-  Controlled,  Endo/Other    Renal/GU      Musculoskeletal   Abdominal   Peds  Hematology   Anesthesia Other Findings   Reproductive/Obstetrics                           Anesthesia Physical Anesthesia Plan  ASA: III  Anesthesia Plan: General and MAC   Post-op Pain Management:    Induction: Intravenous  Airway Management Planned: Mask  Additional Equipment:   Intra-op Plan:   Post-operative Plan:   Informed Consent: I have reviewed the patients History and Physical, chart, labs and discussed the procedure including the risks, benefits and alternatives for the proposed anesthesia with the patient or authorized representative who has indicated his/her understanding and acceptance.   Dental advisory given  Plan Discussed with: CRNA, Anesthesiologist and Surgeon  Anesthesia Plan Comments:         Anesthesia Quick Evaluation

## 2013-05-05 NOTE — Preoperative (Signed)
Beta Blockers   Reason not to administer Beta Blockers:Not Applicable 

## 2013-05-05 NOTE — Anesthesia Postprocedure Evaluation (Signed)
  Anesthesia Post-op Note  Patient: Melanie Whitehead  Procedure(s) Performed: Procedure(s): CARDIOVERSION (N/A)  Patient Location: Endoscopy Unit  Anesthesia Type:General  Level of Consciousness: awake, alert  and oriented  Airway and Oxygen Therapy: Patient Spontanous Breathing and Patient connected to nasal cannula oxygen  Post-op Pain: none  Post-op Assessment: Post-op Vital signs reviewed, Patient's Cardiovascular Status Stable, Respiratory Function Stable, Patent Airway and No signs of Nausea or vomiting  Post-op Vital Signs: Reviewed and stable  Complications: No apparent anesthesia complications

## 2013-05-05 NOTE — H&P (View-Only) (Signed)
 Patient Name: Melanie Whitehead Date of Encounter: 05/02/2013  Primary Care Provider:  PHARR,WALTER DAVIDSON, MD Primary Cardiologist:  J. Allred, MD   Patient Profile  80 year old female with history of paroxysmal atrial fibrillation who was recently admitted due to recurrent atrial fibrillation who presents today due to persistent fatigue.  Problem List   Past Medical History  Diagnosis Date  . BUNDLE BRANCH BLOCK, LEFT 07/09/2006  . CAROTID BRUIT, LEFT 07/08/2007  . COLONIC POLYPS, HX OF 07/09/2006  . DEGENERATIVE JOINT DISEASE 07/09/2006  . GERD 07/13/2008  . HYPERLIPIDEMIA 07/09/2006  . HYPERTENSION 07/09/2006  . OSTEOPOROSIS 07/09/2006  . PAROXYSMAL ATRIAL FIBRILLATION     a. Dx 09/2009;  b. chronic pradaxa;  c. recurrence 04/2013->amiodarone initiated.  . PVD (peripheral vascular disease)     a. moderate R and severe L subclavian artery stenosis  . CAD (coronary artery disease)     a. 2011 Cath: minimal, nonobstructive dzs.  . Hx of colonoscopy   . Left ventricular outflow tract obstruction     a. due to dynamic obstruction with LVH  . Moderate mitral regurgitation     a. 12/2010 Echo: EF 65-70%, mild AI, mod MR, mod-sev dil LA, PASP 41mmHg.  . SCC (squamous cell carcinoma)   . Restless leg syndrome   . Osteopenia   . LVH (left ventricular hypertrophy)    Past Surgical History  Procedure Laterality Date  . Two para two      abortus zero  . Tonsillectomy    . 2-d echo  12/2005    revealed normal LV function and size.  No wall motion at around his  moderate mitral annular calcification.  Elevated LVOT gradient of 22 thought secondary to narrow outflow  tract aortic valve appeared to open well  . Low risk cardiolite  12/2005  . Cataract extraction Bilateral   . Glaucoma surgery    . Pars plana vitrectomy w/ repair of macular hole      Allergies  Allergies  Allergen Reactions  . Sulfa Antibiotics   . Sulfamethoxazole     REACTION: unspecified    HPI  [name]  female with a prior history of paroxysmal fibrillation.  She has been maintained on pradaxa and oral diltiazem therapy over the years without any recurrence.  Unfortunately, over this past weekend she developed dyspnea and palpitations and presented to the ED where she was found to be in rapid A. Fib.  She was also felt to have mild volume overload and was treated with one dose of IV Lasix.  Her diltiazem dose was adjusted to 300 mg daily with relative rate control at rest though her daughter reports that her ambulatory heart rates were in the 130s.  She was discharged home on Sunday and Lasix 10 mg daily was also added to her regimen.  Since her discharge, she's continued to feel fatigued.  She is not noticing palpitations at this time but is short of breath with mild activity.  Last night, she was up most of the night coughing is new for her.  Her daughter called her this morning and she reported cough, fatigue, and mild dyspnea and her daughter contacted our office and she was added onto my schedule today.  Currently she has an intermittent cough but otherwise is not experiencing any chest pain, palpitations, or dyspnea.  She says she simply feels washed out.  She denies PND, orthopnea, dizziness, syncope, edema, or early satiety.  Home Medications  Prior to Admission   medications   Medication Sig Start Date End Date Taking? Authorizing Provider  Biotin ([employer] BIOTIN HIGH POTENCY) 1000 MCG tablet Take 1,000 mcg by mouth daily.   Yes Historical Provider, MD  Calcium Carb-Cholecalciferol 600-400 MG-UNIT TABS Take 1 tablet by mouth 2 (two) times daily.    Yes Historical Provider, MD  Coenzyme Q10 (CO Q 10) 100 MG CAPS Take by mouth daily.   Yes Historical Provider, MD  dabigatran (PRADAXA) 75 MG CAPS Take 1 capsule (75 mg total) by mouth every 12 (twelve) hours. 08/17/12  Yes James Allred, MD  denosumab (PROLIA) 60 MG/ML SOLN injection Inject 60 mg into the skin every 6 (six) months. Administer in upper arm,  thigh, or abdomen   Yes Historical Provider, MD  diltiazem (TIAZAC) 300 MG 24 hr capsule Take 300 mg by mouth daily.   Yes Historical Provider, MD  furosemide (LASIX) 20 MG tablet Take 0.5 tablets (10 mg total) by mouth daily. 04/30/13  Yes Brittainy Simmons, PA-C  Multiple Vitamin (MULTIVITAMIN) tablet Take 1 tablet by mouth daily.     Yes Historical Provider, MD  nitroGLYCERIN (NITROSTAT) 0.4 MG SL tablet Place 1 tablet (0.4 mg total) under the tongue every 5 (five) minutes as needed. 10/03/12  Yes James Allred, MD  rOPINIRole (REQUIP) 0.5 MG tablet Take 0.5 mg by mouth at bedtime.  05/25/12  Yes Historical Provider, MD  rosuvastatin (CRESTOR) 5 MG tablet Take 5 mg by mouth daily.   Yes Historical Provider, MD  amiodarone (PACERONE) 200 MG tablet Take 1 tablet (200 mg total) by mouth 2 (two) times daily. 05/02/13   Aneira Cavitt R Taeveon Keesling, NP  benzonatate (TESSALON PERLES) 100 MG capsule Take 1 capsule (100 mg total) by mouth 2 (two) times daily as needed for cough. 05/02/13   Bryker Fletchall R Marianela Mandrell, NP  diltiazem (CARDIZEM CD) 300 MG 24 hr capsule Take 1 capsule (300 mg total) by mouth daily. 04/30/13   Brittainy Simmons, PA-C    Family History  Family History  Problem Relation Age of Onset  . Coronary artery disease Mother     Died age 84  . Heart attack Mother   . Colon cancer    . Lung cancer    . COPD    . Diabetes    . Stroke Father     Social History  History   Social History  . Marital Status: Married    Spouse Name: N/A    Number of Children: N/A  . Years of Education: N/A   Occupational History  . Not on file.   Social History Main Topics  . Smoking status: Never Smoker   . Smokeless tobacco: Never Used  . Alcohol Use: No  . Drug Use: No  . Sexual Activity: Not Currently   Other Topics Concern  . Not on file   Social History Narrative  . No narrative on file     Review of Systems General:  General malaise.  Reduced exercise tolerance.  No chills, fever, night  sweats or weight changes.  Cardiovascular:  No chest pain, positive dyspnea on exertion, no edema, orthopnea, palpitations, paroxysmal nocturnal dyspnea. Dermatological: No rash, lesions/masses Respiratory: positive nonproductive cough, dyspnea Urologic: No hematuria, dysuria Abdominal:   No nausea, vomiting, diarrhea, bright red blood per rectum, melena, or hematemesis Neurologic:  No visual changes, wkns, changes in mental status. All other systems reviewed and are otherwise negative except as noted above.  Physical Exam  Blood pressure 136/58, pulse 102, height 5' (1.524 m), weight   135 lb (61.236 kg).  General: Pleasant, NAD Psych: Normal affect. Neuro: Alert and oriented X 3. Moves all extremities spontaneously. HEENT: Normal  Neck: Supple without bruits or JVD. Lungs:  Resp regular and unlabored, scattered rhonchi. Heart: irregularly irregular no s3, s4, 3/6 systolic murmur heard throughout. Abdomen: Soft, non-tender, non-distended, BS + x 4.  Extremities: No clubbing, cyanosis or edema. DP/PT/Radials 2+ and equal bilaterally.  Accessory Clinical Findings  ECG - atrial fibrillation, 102, left bundle branch block, no acute ST-T changes.  Assessment & Plan  1.  Atrial fibrillation with rapid ventricular response: Patient was recently hospitalized with recurrent A. Fib and RVR.  She had mild volume overload in that setting and his diuresed and her diltiazem was adjusted to 300 mg daily.  Since her discharge on Sunday, she's continued to feel weak and washed out and heart rates have been documented at home in the 130s at times.  Heart rate today at rest is 102.  I have discussed her case with Dr. Rayann Heman and we will plan to add amiodarone 200 mg b.i.d. to her regimen today.  I have arranged for an elective cardioversion to take place this Friday if she does not convert to sinus rhythm prior to that.  She did have TSH and LFTs when she was hospitalized over the weekend and will require  followup labs along with PFTs if she is to remain on amiodarone.  We will have her hold her diltiazem the morning of cardioversion.  2.  Moderate mitral regurgitation: Last echo was November 2012.  We will arrange for followup echocardiogram  3.  Chronic diastolic congestive heart failure: The patient was mildly volume overloaded in the setting of rapid atrial fibrillation during recent hospitalization.  She is currently on Lasix 10 mg daily and her volume status looks good.  We'll repeat a basic metabolic panel today.  4.  Cough:  Nonproductive.  Afebrile.  ? Mild bronchitis. Will add tessalon.  5.  Dispo:  DCCV Friday.  F/u echo for MR.  F/U Dr. Rayann Heman as scheduled.   Murray Hodgkins, NP 05/02/2013, 1:59 PM

## 2013-05-05 NOTE — Discharge Instructions (Signed)
Electrical Cardioversion °Electrical cardioversion is the delivery of a jolt of electricity to change the rhythm of the heart. Sticky patches or metal paddles are placed on the chest to deliver the electricity from a device. This is done to restore a normal rhythm. A rhythm that is too fast or not regular keeps the heart from pumping well. °Electrical cardioversion is done in an emergency if:  °· There is low or no blood pressure as a result of the heart rhythm.   °· Normal rhythm must be restored as fast as possible to protect the brain and heart from further damage.   °· It may save a life. °Cardioversion may be done for heart rhythms that are not immediately life-threatening, such as atrial fibrillation or flutter, in which:  °· The heart is beating too fast or is not regular.   °· Medicine to change the rhythm has not worked.   °· It is safe to wait in order to allow time for preparation. °· Symptoms of the abnormal rhythm are bothersome. °· The risk of stroke and other serious complications can be reduced. °LET YOUR CAREGIVER KNOW ABOUT:  °· All medicines you are taking, including vitamins, herbs, eye drops, creams, and over-the-counter medicines.   °· Previous problems you or members of your family have had with the use of anesthetics.   °· Any blood disorders you have.   °· Previous surgeries you have had.   °· Medical conditions you have. °RISKS AND COMPLICATIONS  °Generally, this is a safe procedure. However, as with any procedure, complications can occur. Possible complications include:  °· Breathing problems related to the anesthetic used. °· Cardiac arrest This risk is rare. °· A blood clot that breaks free and travels to other parts of your body. This could cause a stroke or other problems. The risk of this is lowered by use of blood thinning medicine (anticoagulant) prior to the procedure. °BEFORE THE PROCEDURE  °· You may have tests to detect blood clots in your heart and evaluate heart  function.  °· You may start taking anticoagulants so your blood does not clot as easily.   °· Medicines may be given to help stabilize your heart rate and rhythm. °PROCEDURE °· You will be given medicine through an IV tube to reduce discomfort and make you sleepy (sedative).   °· An electrical shock will be delivered. °AFTER THE PROCEDURE °Your heart rhythm will be watched to make sure it does not change. You may be able to go home within a few hours.  °Document Released: 01/16/2002 Document Revised: 11/16/2012 Document Reviewed: 08/10/2012 °[employer]® Patient Information ©2014 [employer]. ° °

## 2013-05-05 NOTE — Transfer of Care (Signed)
Immediate Anesthesia Transfer of Care Note  Patient: Melanie Whitehead  Procedure(s) Performed: Procedure(s): CARDIOVERSION (N/A)  Patient Location: PACU  Anesthesia Type:General  Level of Consciousness: awake, alert  and oriented  Airway & Oxygen Therapy: Patient Spontanous Breathing and Patient connected to nasal cannula oxygen  Post-op Assessment: Report given to PACU RN and Post -op Vital signs reviewed and stable  Post vital signs: Reviewed and stable  Complications: No apparent anesthesia complications

## 2013-05-08 ENCOUNTER — Encounter (HOSPITAL_COMMUNITY): Payer: Self-pay | Admitting: Cardiology

## 2013-05-08 ENCOUNTER — Ambulatory Visit (INDEPENDENT_AMBULATORY_CARE_PROVIDER_SITE_OTHER): Payer: Medicare Other | Admitting: Internal Medicine

## 2013-05-08 VITALS — BP 140/62 | HR 54 | Ht 60.0 in | Wt 134.0 lb

## 2013-05-08 DIAGNOSIS — I447 Left bundle-branch block, unspecified: Secondary | ICD-10-CM | POA: Diagnosis not present

## 2013-05-08 DIAGNOSIS — I4891 Unspecified atrial fibrillation: Secondary | ICD-10-CM

## 2013-05-08 DIAGNOSIS — R0602 Shortness of breath: Secondary | ICD-10-CM | POA: Diagnosis not present

## 2013-05-08 MED ORDER — AMIODARONE HCL 200 MG PO TABS: 200.0000 mg | ORAL_TABLET | Freq: Every day | ORAL | Status: DC

## 2013-05-08 NOTE — Patient Instructions (Addendum)
Your physician recommends that you schedule a follow-up appointment on Thurs for EKG and then 2 weeks with PA and 6 weeks with Dr Rayann Heman  Your physician recommends that you return for lab work on Thurs with EKG:BMP    Your physician has recommended you make the following change in your medication:  1) Stop Diltiazem 2) Decrease Amiodarone to 200mg  daily

## 2013-05-10 NOTE — Progress Notes (Signed)
PCP: Horatio Pel, MD  The patient presents today for routine cardiology followup.  She recently presented to our office in afib.  She was tolerating this very poorly and therefore underwent cardioversion.  She was initiated on amiodarone also.  She has done very well since that time.  Though her swelling has improved it is not completely resolved.  SOB and fatigue are much better.  She has a nonproductive cough.  Today, she denies symptoms of palpitations,  orthopnea, PND, dizziness, presyncope, syncope, or neurologic sequela.  She reports that she has occasional SOB if she "gets too busy".  She has had mild exertional angina.  The patient feels that she is tolerating medications without difficulties and is otherwise without complaint today.   Past Medical History  Diagnosis Date  . BUNDLE BRANCH BLOCK, LEFT 07/09/2006  . CAROTID BRUIT, LEFT 07/08/2007  . COLONIC POLYPS, HX OF 07/09/2006  . DEGENERATIVE JOINT DISEASE 07/09/2006  . GERD 07/13/2008  . HYPERLIPIDEMIA 07/09/2006  . HYPERTENSION 07/09/2006  . OSTEOPOROSIS 07/09/2006  . PAROXYSMAL ATRIAL FIBRILLATION     a. Dx 09/2009;  b. chronic pradaxa;  c. recurrence 04/2013->amiodarone initiated.  Marland Kitchen PVD (peripheral vascular disease)     a. moderate R and severe L subclavian artery stenosis  . CAD (coronary artery disease)     a. 2011 Cath: minimal, nonobstructive dzs.  Marland Kitchen Hx of colonoscopy   . Left ventricular outflow tract obstruction     a. due to dynamic obstruction with LVH  . Moderate mitral regurgitation     a. 12/2010 Echo: EF 65-70%, mild AI, mod MR, mod-sev dil LA, PASP 80mmHg.  . SCC (squamous cell carcinoma)   . Restless leg syndrome   . Osteopenia   . LVH (left ventricular hypertrophy)   . CHF (congestive heart failure)   . Atrial fibrillation   . Esophageal reflux   . Labial cyst    Past Surgical History  Procedure Laterality Date  . Two para two      abortus zero  . Tonsillectomy    . 2-d echo  12/2005    revealed  normal LV function and size.  No wall motion at around his  moderate mitral annular calcification.  Elevated LVOT gradient of 22 thought secondary to narrow outflow  tract aortic valve appeared to open well  . Low risk cardiolite  12/2005  . Cataract extraction Bilateral   . Glaucoma surgery    . Pars plana vitrectomy w/ repair of macular hole    . Cardioversion N/A 05/05/2013    Procedure: CARDIOVERSION;  Surgeon: [name], MD;  Location: [facility] ENDOSCOPY;  Service: Cardiovascular;  Laterality: N/A;    Current Outpatient Prescriptions  Medication Sig Dispense Refill  . amiodarone (PACERONE) 200 MG tablet Take 1 tablet (200 mg total) by mouth daily.  90 tablet  3  . benzonatate (TESSALON PERLES) 100 MG capsule Take 1 capsule (100 mg total) by mouth 2 (two) times daily as needed for cough.  20 capsule  0  . Biotin ([employer] BIOTIN HIGH POTENCY) 1000 MCG tablet Take 1,000 mcg by mouth daily.      . Calcium Carb-Cholecalciferol 600-400 MG-UNIT TABS Take 1 tablet by mouth 2 (two) times daily.       . Coenzyme Q10 (CO Q 10) 100 MG CAPS Take 1 capsule by mouth daily.       . dabigatran (PRADAXA) 75 MG CAPS Take 1 capsule (75 mg total) by mouth every 12 (twelve) hours.  180 capsule  3  . denosumab (PROLIA) 60 MG/ML SOLN injection Inject 60 mg into the skin every 6 (six) months. Administer in upper arm, thigh, or abdomen      . furosemide (LASIX) 20 MG tablet Take 0.5 tablets by mouth daily.       . Multiple Vitamin (MULTIVITAMIN) tablet Take 1 tablet by mouth daily.        . nitroGLYCERIN (NITRODUR - DOSED IN MG/24 HR) 0.4 mg/hr patch Place 0.4 mg onto the skin daily. One at at 7am and one at 7pm      . nitroGLYCERIN (NITROSTAT) 0.4 MG SL tablet Place 1 tablet (0.4 mg total) under the tongue every 5 (five) minutes as needed.  25 tablet  3  . rOPINIRole (REQUIP) 0.5 MG tablet Take 0.5 mg by mouth at bedtime.       . rosuvastatin (CRESTOR) 5 MG tablet Take 5 mg by mouth daily.      . valsartan (DIOVAN)  160 MG tablet Take 1 tablet by mouth daily.       No current facility-administered medications for this visit.    Allergies  Allergen Reactions  . Sulfa Antibiotics Nausea And Vomiting  . Sulfamethoxazole     REACTION: unspecified    History   Social History  . Marital Status: Married    Spouse Name: N/A    Number of Children: N/A  . Years of Education: N/A   Occupational History  . Not on file.   Social History Main Topics  . Smoking status: Never Smoker   . Smokeless tobacco: Never Used  . Alcohol Use: No  . Drug Use: No  . Sexual Activity: Not Currently   Other Topics Concern  . Not on file   Social History Narrative  . No narrative on file    Family History  Problem Relation Age of Onset  . Coronary artery disease Mother     Died age 5  . Heart attack Mother   . Colon cancer    . Lung cancer    . COPD    . Diabetes    . Stroke Father    Physical Exam: Filed Vitals:   05/08/13 1415  BP: 140/62  Pulse: 54  Height: 5' (1.524 m)  Weight: 134 lb (60.782 kg)    GEN- The patient is well appearing, alert and oriented x 3 today.   Head- normocephalic, atraumatic Eyes-  Sclera clear, conjunctiva pink Ears- hearing intact Oropharynx- clear Neck- supple, no JVP Lymph- no cervical lymphadenopathy Lungs- Clear to ausculation bilaterally, normal work of breathing Heart- Regular rate and rhythm,  2/6 SEM LUSB, early peaking, GI- soft, NT, ND, + BS Extremities- no clubbing, cyanosis, no edema MS- age appropriate muscle atrophy Neuro- strength and sensation are intact  EKG today reveals sinus rhythm, LBBB.  Though I suspect 1:1 AV conduction with an abnormal T wave, I cannot exclude 2:1 AV conduction.  Assessment and Plan:  1. afib Maintaining sinus rhythm on amiodarone Decrease amiodarone to 200mg  daily Continue pradaxa  2. LBBB ekg interpretation today is difficult.  I suspect that she has 1:1 AV conduction with t wave abnormality similar to prior  ekg but cannot completely rule out 2:1 AV block.  She is clear that she would avoid PPM if possible. I will therefore stop diltiazem and decrease amiodarone to 200mg  daily.  She will return later this week for a repeat ekg  3. HL Stable No change required today  Return to see Lori/ Scott in  2 weeks I will see again in 6 weeks

## 2013-05-11 ENCOUNTER — Ambulatory Visit (INDEPENDENT_AMBULATORY_CARE_PROVIDER_SITE_OTHER): Payer: Medicare Other | Admitting: *Deleted

## 2013-05-11 ENCOUNTER — Other Ambulatory Visit (INDEPENDENT_AMBULATORY_CARE_PROVIDER_SITE_OTHER): Payer: Medicare Other

## 2013-05-11 ENCOUNTER — Encounter: Payer: Self-pay | Admitting: *Deleted

## 2013-05-11 ENCOUNTER — Encounter: Payer: Self-pay | Admitting: Internal Medicine

## 2013-05-11 VITALS — BP 158/56 | HR 63 | Resp 16 | Ht 60.0 in | Wt 134.0 lb

## 2013-05-11 DIAGNOSIS — I4891 Unspecified atrial fibrillation: Secondary | ICD-10-CM

## 2013-05-11 DIAGNOSIS — Z79899 Other long term (current) drug therapy: Secondary | ICD-10-CM

## 2013-05-11 LAB — BASIC METABOLIC PANEL
BUN: 22 mg/dL (ref 6–23)
CALCIUM: 10.1 mg/dL (ref 8.4–10.5)
CHLORIDE: 102 meq/L (ref 96–112)
CO2: 24 mEq/L (ref 19–32)
Creatinine, Ser: 1.5 mg/dL — ABNORMAL HIGH (ref 0.4–1.2)
GFR: 35.47 mL/min — ABNORMAL LOW (ref >60.00)
Glucose, Bld: 96 mg/dL (ref 70–99)
Potassium: 4.3 mEq/L (ref 3.5–5.1)
SODIUM: 135 meq/L (ref 135–145)

## 2013-05-11 NOTE — Progress Notes (Signed)
1.) Reason for visit: f/u EKG with bmet for medication management  2.) Name of MD requesting visit: Dr Rayann Heman  3.) H&P: HX Afib/LBBB  05/08/13 pt to DC diltiazem and reduce Amiodarone to 200 mg daily   4.) ROS related to problem: pt denies c/o SOB. No complaints. EKG performed and taken to DOD Dr Lovena Le SR@63  BPM, LBBB  5.) Assessment and plan per MD: lab today//bmet, echo 05/17/13, follow up 05/24/13 with Richardson Dopp PA-C, 06/07/13 with Dr Rayann Heman.  6.) Provider sign-of(MD statement): Agree Mikle Bosworth.D.

## 2013-05-11 NOTE — Progress Notes (Signed)
Patient ID: Melanie Whitehead, female   DOB: 1924/09/28, 80 y.o.   MRN: [id number]

## 2013-05-17 ENCOUNTER — Encounter: Payer: Self-pay | Admitting: Internal Medicine

## 2013-05-17 ENCOUNTER — Encounter: Payer: Self-pay | Admitting: Cardiovascular Disease

## 2013-05-17 ENCOUNTER — Ambulatory Visit (HOSPITAL_COMMUNITY): Payer: Medicare Other | Attending: Cardiovascular Disease | Admitting: Cardiology

## 2013-05-17 DIAGNOSIS — R0602 Shortness of breath: Secondary | ICD-10-CM

## 2013-05-17 DIAGNOSIS — I059 Rheumatic mitral valve disease, unspecified: Secondary | ICD-10-CM | POA: Diagnosis not present

## 2013-05-17 DIAGNOSIS — I447 Left bundle-branch block, unspecified: Secondary | ICD-10-CM

## 2013-05-17 DIAGNOSIS — R0609 Other forms of dyspnea: Secondary | ICD-10-CM | POA: Diagnosis not present

## 2013-05-17 DIAGNOSIS — R0989 Other specified symptoms and signs involving the circulatory and respiratory systems: Secondary | ICD-10-CM | POA: Insufficient documentation

## 2013-05-17 DIAGNOSIS — I4891 Unspecified atrial fibrillation: Secondary | ICD-10-CM | POA: Insufficient documentation

## 2013-05-17 DIAGNOSIS — Q248 Other specified congenital malformations of heart: Secondary | ICD-10-CM

## 2013-05-17 DIAGNOSIS — I340 Nonrheumatic mitral (valve) insufficiency: Secondary | ICD-10-CM

## 2013-05-17 NOTE — Progress Notes (Signed)
Echo performed. 

## 2013-05-19 NOTE — Telephone Encounter (Signed)
Not in 2:1 block   EKG looks good per Dr Rayann Heman  Will have a copy of the CD mailed to you  Claiborne Billings

## 2013-05-23 ENCOUNTER — Telehealth: Payer: Self-pay | Admitting: Cardiovascular Disease

## 2013-05-23 NOTE — Telephone Encounter (Signed)
Follow up ° ° ° °Pt returned this offices phone call.  Pt would like a call back for her results please. Pt stated is unable to navigate MyChart for now.  °

## 2013-05-23 NOTE — Telephone Encounter (Signed)
Follow up     Pt would like a call back with her results please.   She is unable to navigate MyChart at this time.

## 2013-05-23 NOTE — Telephone Encounter (Signed)
Aware of results. 

## 2013-05-23 NOTE — Telephone Encounter (Signed)
Follow up     Pt want her test results.  She want to talk to [facility]. She is a Dr Rayann Heman pt.

## 2013-05-23 NOTE — Progress Notes (Unsigned)
************* °  lvm to conf 4/15 appt & local    O

## 2013-05-23 NOTE — Telephone Encounter (Signed)
Follow up    Pt returned this offices phone call.  Pt would like a call back for her results please. Pt stated is unable to navigate MyChart for now.

## 2013-05-24 ENCOUNTER — Encounter: Payer: Self-pay | Admitting: Physician Assistant

## 2013-05-24 ENCOUNTER — Ambulatory Visit (INDEPENDENT_AMBULATORY_CARE_PROVIDER_SITE_OTHER): Payer: Medicare Other | Admitting: Physician Assistant

## 2013-05-24 VITALS — BP 140/52 | HR 65 | Ht 60.0 in | Wt 132.0 lb

## 2013-05-24 DIAGNOSIS — R0602 Shortness of breath: Secondary | ICD-10-CM

## 2013-05-24 DIAGNOSIS — I2510 Atherosclerotic heart disease of native coronary artery without angina pectoris: Secondary | ICD-10-CM

## 2013-05-24 DIAGNOSIS — I340 Nonrheumatic mitral (valve) insufficiency: Secondary | ICD-10-CM

## 2013-05-24 DIAGNOSIS — E785 Hyperlipidemia, unspecified: Secondary | ICD-10-CM

## 2013-05-24 DIAGNOSIS — I4891 Unspecified atrial fibrillation: Secondary | ICD-10-CM

## 2013-05-24 DIAGNOSIS — I059 Rheumatic mitral valve disease, unspecified: Secondary | ICD-10-CM

## 2013-05-24 DIAGNOSIS — R079 Chest pain, unspecified: Secondary | ICD-10-CM | POA: Diagnosis not present

## 2013-05-24 DIAGNOSIS — I428 Other cardiomyopathies: Secondary | ICD-10-CM

## 2013-05-24 DIAGNOSIS — I5042 Chronic combined systolic (congestive) and diastolic (congestive) heart failure: Secondary | ICD-10-CM

## 2013-05-24 DIAGNOSIS — I10 Essential (primary) hypertension: Secondary | ICD-10-CM

## 2013-05-24 DIAGNOSIS — I429 Cardiomyopathy, unspecified: Secondary | ICD-10-CM

## 2013-05-24 NOTE — Progress Notes (Signed)
[place], [place]  [address] Phone: [phone] Fax:  [phone]  Date:  05/24/2013   ID:  Melanie Whitehead, DOB Feb 21, 1924, MRN [id number]  PCP:  Horatio Pel, MD  Cardiologist:  Dr. Thompson Grayer     History of Present Illness: Melanie Whitehead is a 80 y.o. female  with a history of paroxysmal atrial fibrillation, bilateral subclavian artery stenosis, mitral regurgitation, HTN, HL.  She was seen last month after a recent admission to the hospital with recurrent atrial fibrillation.  This was complicated by acute on chronic diastolic CHF. She was placed on amiodarone. She underwent cardioversion. She was last seen by Dr. Rayann Heman 05/10/13. She remained in sinus rhythm. Her ECG demonstrated 1:1 versus 2:1 AV conduction. Therefore, diltiazem was stopped and her amiodarone dose was decreased. She returns for close follow up.  She is doing well. She did note some chest tightness recently with " rushing." She denies resting symptoms. She's probably noticed this for about 6 months. She denies associated radiation, nausea or diaphoresis.  She has noted some dyspnea with exertion. She is probably NYHA class IIb. She denies orthopnea, PND or edema. She denies cough. She denies syncope.  Studies:  - LHC (11/2009):  EF 70-75%, prox to mid LAD, 30-40.  - Echo (05/17/13):  Mod LVH, EF 45-50%, no RWMA, mild AI, mod MR, MAC, mild LAE, mod TR, PASP 35 mmHg, trivial eff.  - Echo (12/2010):  EF 65-70%  - Nuclear (10/2008):  No scar or ischemia, EF 67%   Recent Labs: 04/29/2013: Hemoglobin 12.5; Pro B Natriuretic peptide (BNP) 6412.0*; TSH 2.252  05/11/2013: Creatinine 1.5*; Potassium 4.3   Wt Readings from Last 3 Encounters:  05/24/13 132 lb (59.875 kg)  05/11/13 134 lb (60.782 kg)  05/08/13 134 lb (60.782 kg)     Past Medical History  Diagnosis Date  . BUNDLE BRANCH BLOCK, LEFT 07/09/2006  . CAROTID BRUIT, LEFT 07/08/2007  . COLONIC POLYPS, HX OF 07/09/2006  . DEGENERATIVE  JOINT DISEASE 07/09/2006  . GERD 07/13/2008  . HYPERLIPIDEMIA 07/09/2006  . HYPERTENSION 07/09/2006  . OSTEOPOROSIS 07/09/2006  . PAROXYSMAL ATRIAL FIBRILLATION     a. Dx 09/2009;  b. chronic pradaxa;  c. recurrence 04/2013->amiodarone initiated.  Marland Kitchen PVD (peripheral vascular disease)     a. moderate R and severe L subclavian artery stenosis  . CAD (coronary artery disease)     a. 2011 Cath: minimal, nonobstructive dzs.  Marland Kitchen Hx of colonoscopy   . Left ventricular outflow tract obstruction     a. due to dynamic obstruction with LVH  . Moderate mitral regurgitation     a. 12/2010 Echo: EF 65-70%, mild AI, mod MR, mod-sev dil LA, PASP 77mmHg.  . SCC (squamous cell carcinoma)   . Restless leg syndrome   . Osteopenia   . LVH (left ventricular hypertrophy)   . CHF (congestive heart failure)   . Atrial fibrillation   . Esophageal reflux   . Labial cyst     Current Outpatient Prescriptions  Medication Sig Dispense Refill  . amiodarone (PACERONE) 200 MG tablet Take 1 tablet (200 mg total) by mouth daily.  90 tablet  3  . Biotin ([employer] BIOTIN HIGH POTENCY) 1000 MCG tablet Take 1,000 mcg by mouth daily.      . Calcium Carb-Cholecalciferol 600-400 MG-UNIT TABS Take 1 tablet by mouth 2 (two) times daily.       . Coenzyme Q10 (CO Q 10) 100 MG CAPS Take 1 capsule  by mouth daily.       . dabigatran (PRADAXA) 75 MG CAPS Take 1 capsule (75 mg total) by mouth every 12 (twelve) hours.  180 capsule  3  . denosumab (PROLIA) 60 MG/ML SOLN injection Inject 60 mg into the skin every 6 (six) months. Administer in upper arm, thigh, or abdomen      . furosemide (LASIX) 20 MG tablet Take 0.5 tablets by mouth daily.       . Multiple Vitamin (MULTIVITAMIN) tablet Take 1 tablet by mouth daily.        . nitroGLYCERIN (NITRODUR - DOSED IN MG/24 HR) 0.4 mg/hr patch Place 0.4 mg onto the skin daily. One at at 7am and one at 7pm      . nitroGLYCERIN (NITROSTAT) 0.4 MG SL tablet Place 1 tablet (0.4 mg total) under the tongue every  5 (five) minutes as needed.  25 tablet  3  . rOPINIRole (REQUIP) 0.5 MG tablet Take 0.5 mg by mouth at bedtime.       . rosuvastatin (CRESTOR) 5 MG tablet Take 5 mg by mouth daily.      . valsartan (DIOVAN) 160 MG tablet Take 1 tablet by mouth daily.       No current facility-administered medications for this visit.    Allergies:   Sulfa antibiotics and Sulfamethoxazole   Social History:  The patient  reports that she has never smoked. She has never used smokeless tobacco. She reports that she does not drink alcohol or use illicit drugs.   Family History:  The patient's family history includes COPD in an other family member; Colon cancer in an other family member; Coronary artery disease in her mother; Diabetes in an other family member; Heart attack in her mother; Lung cancer in an other family member; Stroke in her father.   ROS:  Please see the history of present illness.      All other systems reviewed and negative.   PHYSICAL EXAM: VS:  BP 140/52  Pulse 65  Ht 5' (1.524 m)  Wt 132 lb (59.875 kg)  BMI 25.78 kg/m2 Well nourished, well developed, in no acute distress HEENT: normal Neck: no JVD Cardiac:  normal S1, S2; RRR; 2/6 holosystolic murmur at LLSB Lungs:  clear to auscultation bilaterally, no wheezing, rhonchi or rales Abd: soft, nontender, no hepatomegaly Ext: no edema Skin: warm and dry Neuro:  CNs 2-12 intact, no focal abnormalities noted  EKG:  NSR, HR 65, LBBB     ASSESSMENT AND PLAN:  1. Paroxysmal Atrial Fibrillation: She is maintaining NSR. Continue current dose of amiodarone. No further evidence of 2:1 AV block. She may resume driving. Continue current dose of Pradaxa. 2. Chest Discomfort: She has exertional chest discomfort. She had mild nonobstructive CAD at cardiac catheterization in 2011. I will arrange Lexiscan Myoview to ischemia. 3. Mitral Regurgitation: Stable by recent echo. 4. Chronic Combined Systolic and Diastolic CHF: Volume appears  stable. 5. Cardiomyopathy: Reduced ejection fraction is likely related to atrial fibrillation. This will hopefully return to normal with restoration of NSR.  Continue ARB. Avoid beta blocker given recent history of 2:1 AV block. 6. Hypertension: Controlled. 7. Hyperlipidemia: Continue statin. 8. CAD: Arrange Myoview as noted. She is not aspirin as she is on Pradaxa. Continue statin. 9. Disposition: Followup with Dr. Rayann Heman 06/07/13 as planned.  Signed, Richardson Dopp, PA-C  05/24/2013 10:19 AM

## 2013-05-24 NOTE — Patient Instructions (Signed)
Your physician recommends that you continue on your current medications as directed. Please refer to the Current Medication list given to you today.  Your physician has requested that you have a lexiscan myoview. For further information please visit [email or web address]. Please follow instruction sheet, as given.  Your physician recommends that you schedule a follow-up appointment in: as previously scheduled

## 2013-05-31 ENCOUNTER — Telehealth: Payer: Self-pay | Admitting: Internal Medicine

## 2013-05-31 NOTE — Telephone Encounter (Signed)
Spoke with patient's daughter and answered her concerns in regards to the stress test and her Mom's over all heart condition.  She is concerned with the medication that is given to stress the heart and wonders is the benefits out weighs the risks.

## 2013-05-31 NOTE — Telephone Encounter (Signed)
New Message:  Pt's daughter states she has some concerns about the pt having the Nuc Stress test... She is requesting to talk w/ Dr. Rayann Heman or Claiborne Billings

## 2013-06-01 ENCOUNTER — Ambulatory Visit (HOSPITAL_COMMUNITY)
Admission: RE | Admit: 2013-06-01 | Discharge: 2013-06-01 | Disposition: A | Discharge status: Home / Self Care | Payer: Medicare Other | Source: Ambulatory Visit | Attending: Cardiovascular Disease | Admitting: Cardiovascular Disease

## 2013-06-01 ENCOUNTER — Encounter: Payer: Self-pay | Admitting: Physician Assistant

## 2013-06-01 DIAGNOSIS — R0602 Shortness of breath: Secondary | ICD-10-CM | POA: Diagnosis not present

## 2013-06-01 DIAGNOSIS — I2510 Atherosclerotic heart disease of native coronary artery without angina pectoris: Secondary | ICD-10-CM | POA: Diagnosis not present

## 2013-06-01 DIAGNOSIS — R079 Chest pain, unspecified: Secondary | ICD-10-CM | POA: Insufficient documentation

## 2013-06-01 MED ORDER — REGADENOSON 0.4 MG/5ML IV SOLN
0.4000 mg | Freq: Once | INTRAVENOUS | Status: AC
  Administered 2013-06-01: 0.4 mg via INTRAVENOUS

## 2013-06-01 MED ORDER — AMINOPHYLLINE 25 MG/ML IV SOLN
75.0000 mg | Freq: Once | INTRAVENOUS | Status: AC
  Administered 2013-06-01: 75 mg via INTRAVENOUS

## 2013-06-01 MED ORDER — TECHNETIUM TC 99M SESTAMIBI GENERIC - CARDIOLITE
31.0000 | Freq: Once | INTRAVENOUS | Status: AC | PRN
  Administered 2013-06-01: 31 via INTRAVENOUS

## 2013-06-01 MED ORDER — TECHNETIUM TC 99M SESTAMIBI GENERIC - CARDIOLITE
9.9000 | Freq: Once | INTRAVENOUS | Status: AC | PRN
  Administered 2013-06-01: 10 via INTRAVENOUS

## 2013-06-01 NOTE — Procedures (Addendum)
[facility] [address] [place] [address] [phone]  Cardiology Nuclear Med Study  Melanie Whitehead is a 80 y.o. female     MRN : [id number]     DOB: [birth year]  Procedure Date: 06/01/2013  Nuclear Med Background Indication for Stress Test:  Evaluation for Ischemia and [facility] History:  CAD;PAF;LVH;MV REGURG;CHF;Last stress MPI on 01/07/2006-nonischemic;EF=74% Cardiac Risk Factors: Carotid Disease, Family History - CAD, Hypertension, LBBB, Lipids and PVD  Symptoms:  Chest Pain, DOE and Fatigue   Nuclear Pre-Procedure Caffeine/Decaff Intake:  12:00am NPO After: 10am   IV Site: R Forearm  IV 0.9% NS with Angio Cath:  22g  Chest Size (in):  n/a IV Started by: Rolene Course, RN  Height: 5' (1.524 m)  Cup Size: DDD  BMI:  Body mass index is 25.78 kg/(m^2). Weight:  132 lb (59.875 kg)   Tech Comments:  n/a    Nuclear Med Study 1 or 2 day study: 1 day  Stress Test Type:  [place] Provider:  Richardson Dopp, PAC   Resting Radionuclide: Technetium 10m Sestamibi  Resting Radionuclide Dose: 9.9 mCi   Stress Radionuclide:  Technetium 31m Sestamibi  Stress Radionuclide Dose: 31.0 mCi           Stress Protocol Rest HR: 52 Stress HR: 80  Rest BP: 187/63 Stress BP:205/60  Exercise Time (min): n/a METS: n/a          Dose of Adenosine (mg):  n/a Dose of Lexiscan: 0.4 mg  Dose of Atropine (mg): n/a Dose of Dobutamine: n/a mcg/kg/min (at max HR)  Stress Test Technologist: Mellody Memos, CCT Nuclear Technologist: Imagene Riches, CNMT   Rest Procedure:  Myocardial perfusion imaging was performed at rest 45 minutes following the intravenous administration of Technetium 47m Sestamibi. Stress Procedure:  The patient received IV Lexiscan 0.4 mg over 15-seconds.  Technetium 38m Sestamibi injected IV at 30-seconds.  Patient experienced shortness of breath, tightness, stomach pains and was administered 75 mg of  Aminophylline IV at 5 minutes. There were no significant changes with Lexiscan.  Quantitative spect images were obtained after a 45 minute delay.  Transient Ischemic Dilatation (Normal <1.22):  1.10 Lung/Heart Ratio (Normal <0.45):  0.24 QGS EDV:  86 ml QGS ESV:  36 ml LV Ejection Fraction: 58%       Rest ECG: NSR-LBBB  Stress ECG: No significant change from baseline ECG  QPS Raw Data Images:  Normal; no motion artifact; normal heart/lung ratio. Stress Images:  Normal homogeneous uptake in all areas of the myocardium. Rest Images:  Normal homogeneous uptake in all areas of the myocardium. Subtraction (SDS):  No evidence of ischemia.  Impression Exercise Capacity:  Lexiscan with no exercise. BP Response:  Normal blood pressure response. Clinical Symptoms:  No significant symptoms noted. ECG Impression:  No significant ST segment change suggestive of ischemia. Comparison with Prior Nuclear Study: No significant change from previous study  Overall Impression:  Normal stress nuclear study.  LV Wall Motion:  NL LV Function; NL Wall Motion   Lorretta Harp, MD  06/01/2013 5:48 PM

## 2013-06-02 ENCOUNTER — Telehealth: Payer: Self-pay | Admitting: *Deleted

## 2013-06-02 NOTE — Telephone Encounter (Signed)
pt's husband notified about myoview results with verbal understanding

## 2013-06-07 ENCOUNTER — Encounter: Payer: Self-pay | Admitting: Internal Medicine

## 2013-06-07 ENCOUNTER — Ambulatory Visit (INDEPENDENT_AMBULATORY_CARE_PROVIDER_SITE_OTHER): Payer: Medicare Other | Admitting: Internal Medicine

## 2013-06-07 VITALS — BP 146/62 | HR 55 | Ht 60.0 in | Wt 134.2 lb

## 2013-06-07 DIAGNOSIS — I447 Left bundle-branch block, unspecified: Secondary | ICD-10-CM | POA: Diagnosis not present

## 2013-06-07 DIAGNOSIS — R0602 Shortness of breath: Secondary | ICD-10-CM | POA: Diagnosis not present

## 2013-06-07 DIAGNOSIS — I4891 Unspecified atrial fibrillation: Secondary | ICD-10-CM

## 2013-06-07 DIAGNOSIS — I10 Essential (primary) hypertension: Secondary | ICD-10-CM | POA: Diagnosis not present

## 2013-06-07 DIAGNOSIS — I2510 Atherosclerotic heart disease of native coronary artery without angina pectoris: Secondary | ICD-10-CM

## 2013-06-07 DIAGNOSIS — Q248 Other specified congenital malformations of heart: Secondary | ICD-10-CM

## 2013-06-07 NOTE — Patient Instructions (Signed)
Your physician recommends that you schedule a follow-up appointment in: 6 weeks with Doran Clay, NP and 3 months with Dr Rayann Heman.   Your physician has recommended that you have a pulmonary function test. Pulmonary Function Tests are a group of tests that measure how well air moves in and out of your lungs.

## 2013-06-07 NOTE — Progress Notes (Signed)
PCP: Horatio Pel, MD  The patient presents today for routine cardiology followup.  She is doing well presently.  Her SOB is stable. Today, she denies symptoms of palpitations,  orthopnea, PND, dizziness, presyncope, syncope, or neurologic sequela.    The patient feels that she is tolerating medications without difficulties and is otherwise without complaint today.   Past Medical History  Diagnosis Date  . BUNDLE BRANCH BLOCK, LEFT 07/09/2006  . CAROTID BRUIT, LEFT 07/08/2007  . COLONIC POLYPS, HX OF 07/09/2006  . DEGENERATIVE JOINT DISEASE 07/09/2006  . GERD 07/13/2008  . HYPERLIPIDEMIA 07/09/2006  . HYPERTENSION 07/09/2006  . OSTEOPOROSIS 07/09/2006  . PAROXYSMAL ATRIAL FIBRILLATION     a. Dx 09/2009;  b. chronic pradaxa;  c. recurrence 04/2013->amiodarone initiated.  Marland Kitchen PVD (peripheral vascular disease)     a. moderate R and severe L subclavian artery stenosis  . CAD (coronary artery disease)     a. 2011 Cath: minimal, nonobstructive dzs.  Marland Kitchen Hx of colonoscopy   . Left ventricular outflow tract obstruction     a. due to dynamic obstruction with LVH  . Moderate mitral regurgitation     a. 12/2010 Echo: EF 65-70%, mild AI, mod MR, mod-sev dil LA, PASP 7mmHg.  . SCC (squamous cell carcinoma)   . Restless leg syndrome   . Osteopenia   . LVH (left ventricular hypertrophy)   . CHF (congestive heart failure)   . Atrial fibrillation   . Esophageal reflux   . Labial cyst   . Hx of cardiovascular stress test 2015    Lexiscan Myoview (05/2013):  No ischemia, EF 58%, normal study   Past Surgical History  Procedure Laterality Date  . Two para two      abortus zero  . Tonsillectomy    . 2-d echo  12/2005    revealed normal LV function and size.  No wall motion at around his  moderate mitral annular calcification.  Elevated LVOT gradient of 22 thought secondary to narrow outflow  tract aortic valve appeared to open well  . Low risk cardiolite  12/2005  . Cataract extraction Bilateral   .  Glaucoma surgery    . Pars plana vitrectomy w/ repair of macular hole    . Cardioversion N/A 05/05/2013    Procedure: CARDIOVERSION;  Surgeon: [name], MD;  [facility] END[facility]COPY;  Service: Cardiovascular;  Laterality: N/A;    Current Outpatient Prescriptions  Medication Sig Dispense Refill  . amiodarone (PACERONE) 200 MG tablet Take 1 tablet (200 mg total) by mouth daily.  90 tablet  3  . Biotin ([employer] BIOTIN HIGH POTENCY) 1000 MCG tablet Take 1,000 mcg by mouth daily.      . Calcium Carb-Cholecalciferol 600-400 MG-UNIT TABS Take 1 tablet by mouth 2 (two) times daily.       . Coenzyme Q10 (CO Q 10) 100 MG CAPS Take 1 capsule by mouth daily.       . dabigatran (PRADAXA) 75 MG CAPS Take 1 capsule (75 mg total) by mouth every 12 (twelve) hours.  180 capsule  3  . denosumab (PROLIA) 60 MG/ML SOLN injection Inject 60 mg into the skin every 6 (six) months. Administer in upper arm, thigh, or abdomen      . furosemide (LASIX) 20 MG tablet Take 0.5 tablets by mouth daily.       . Multiple Vitamin (MULTIVITAMIN) tablet Take 1 tablet by mouth daily.        . nitroGLYCERIN (NITRODUR - DOSED IN MG/24 HR)  0.4 mg/hr patch Place 0.4 mg onto the skin daily. One at at 7am and one at 7pm      . nitroGLYCERIN (NITROSTAT) 0.4 MG SL tablet Place 1 tablet (0.4 mg total) under the tongue every 5 (five) minutes as needed.  25 tablet  3  . rOPINIRole (REQUIP) 0.5 MG tablet Take 0.5 mg by mouth at bedtime.       . rosuvastatin (CRESTOR) 5 MG tablet Take 5 mg by mouth daily.      . valsartan (DIOVAN) 160 MG tablet Take 1 tablet by mouth daily.       No current facility-administered medications for this visit.    Allergies  Allergen Reactions  . Sulfa Antibiotics Nausea And Vomiting  . Sulfamethoxazole     REACTION: unspecified    History   Social History  . Marital Status: Married    Spouse Name: N/A    Number of Children: N/A  . Years of Education: N/A   Occupational History  . Not on file.    Social History Main Topics  . Smoking status: Never Smoker   . Smokeless tobacco: Never Used  . Alcohol Use: No  . Drug Use: No  . Sexual Activity: Not Currently   Other Topics Concern  . Not on file   Social History Narrative  . No narrative on file    Family History  Problem Relation Age of Onset  . Coronary artery disease Mother     Died age 80  . Heart attack Mother   . Colon cancer    . Lung cancer    . COPD    . Diabetes    . Stroke Father    Physical Exam: Filed Vitals:   06/07/13 1042  BP: 146/62  Pulse: 55  Height: 5' (1.524 m)  Weight: 134 lb 3.2 oz (60.873 kg)    GEN- The patient is well appearing, alert and oriented x 3 today.   Head- normocephalic, atraumatic Eyes-  Sclera clear, conjunctiva pink Ears- hearing intact Oropharynx- clear Neck- supple, no JVP Lymph- no cervical lymphadenopathy Lungs- Clear to ausculation bilaterally, normal work of breathing Heart- Regular rate and rhythm,  2/6 SEM LUSB, early peaking, GI- soft, NT, ND, + BS Extremities- no clubbing, cyanosis, no edema MS- age appropriate muscle atrophy Neuro- strength and sensation are intact  EKG today reveals sinus rhythm 55 bpm, LBBB.   Assessment and Plan:  1. afib Maintaining sinus rhythm on amiodarone Continue pradaxa  2. LV hypertrophy with hyperdynamic LV Maintain preload, avoid dehydration Given bradycardia cannot add beta blocker or CCB No changes today  3. HL Stable No change required today  4. SOB Likely due to LV obstruction As she is on amidoarone, I will order pfts to evaluate for underlying lung disease also  Return to see Cecille Rubin in 6 weeks I will see again in 3 months

## 2013-06-09 ENCOUNTER — Telehealth: Payer: Self-pay | Admitting: Internal Medicine

## 2013-06-09 NOTE — Telephone Encounter (Signed)
New message      Regarding FMLA forms.  Daughter needs to be able to get off work to bring mom to her appts.  Please call

## 2013-06-12 NOTE — Telephone Encounter (Signed)
Sent daughter an email to have the forms brought to the office for Dr Rayann Heman to review

## 2013-06-14 NOTE — Telephone Encounter (Signed)
Forms done.  I put on Kim's desk to call the patient's daughter to pick up

## 2013-06-15 ENCOUNTER — Telehealth: Payer: Self-pay | Admitting: Internal Medicine

## 2013-06-15 NOTE — Telephone Encounter (Signed)
Daughter made aware Ready, Forms faxed, Originals mailed

## 2013-06-15 NOTE — Telephone Encounter (Signed)
Daughter aware FMLA Completed Faxed to [employer] [phone] Original  Mailed to Daughter Home Address [facility] Per Claiborne Billings )  [address] [place] [address] [phone]/[id number]

## 2013-07-05 ENCOUNTER — Other Ambulatory Visit: Payer: Self-pay | Admitting: Internal Medicine

## 2013-07-10 ENCOUNTER — Ambulatory Visit (INDEPENDENT_AMBULATORY_CARE_PROVIDER_SITE_OTHER): Payer: Medicare Other | Admitting: Internal Medicine

## 2013-07-10 DIAGNOSIS — R0602 Shortness of breath: Secondary | ICD-10-CM

## 2013-07-10 LAB — PULMONARY FUNCTION TEST
DL/VA % PRED: 97 %
DL/VA: 4.04 ml/min/mmHg/L
DLCO unc % pred: 94 %
DLCO unc: 17.21 ml/min/mmHg
FEF 25-75 POST: 1.37 L/s
FEF 25-75 Pre: 1.21 L/sec
FEF2575-%CHANGE-POST: 13 %
FEF2575-%PRED-PRE: 152 %
FEF2575-%Pred-Post: 172 %
FEV1-%Change-Post: 3 %
FEV1-%Pred-Post: 111 %
FEV1-%Pred-Pre: 108 %
FEV1-Post: 1.43 L
FEV1-Pre: 1.38 L
FEV1FVC-%Change-Post: 5 %
FEV1FVC-%Pred-Pre: 107 %
FEV6-%CHANGE-POST: -2 %
FEV6-%Pred-Post: 106 %
FEV6-%Pred-Pre: 109 %
FEV6-PRE: 1.78 L
FEV6-Post: 1.74 L
FEV6FVC-%Change-Post: 0 %
FEV6FVC-%Pred-Post: 107 %
FEV6FVC-%Pred-Pre: 107 %
FVC-%Change-Post: -1 %
FVC-%PRED-POST: 99 %
FVC-%Pred-Pre: 101 %
FVC-Post: 1.75 L
FVC-Pre: 1.78 L
POST FEV1/FVC RATIO: 82 %
POST FEV6/FVC RATIO: 100 %
PRE FEV1/FVC RATIO: 78 %
Pre FEV6/FVC Ratio: 100 %
RV % pred: 77 %
RV: 1.78 L
TLC % pred: 85 %
TLC: 3.74 L

## 2013-07-10 NOTE — Progress Notes (Signed)
PFT done today. 

## 2013-07-19 ENCOUNTER — Ambulatory Visit (INDEPENDENT_AMBULATORY_CARE_PROVIDER_SITE_OTHER): Payer: Medicare Other | Admitting: Nurse Practitioner

## 2013-07-19 ENCOUNTER — Encounter: Payer: Self-pay | Admitting: Nurse Practitioner

## 2013-07-19 VITALS — BP 160/50 | HR 77 | Ht 60.0 in | Wt 133.1 lb

## 2013-07-19 DIAGNOSIS — Z79899 Other long term (current) drug therapy: Secondary | ICD-10-CM | POA: Diagnosis not present

## 2013-07-19 DIAGNOSIS — I4891 Unspecified atrial fibrillation: Secondary | ICD-10-CM

## 2013-07-19 DIAGNOSIS — I480 Paroxysmal atrial fibrillation: Secondary | ICD-10-CM

## 2013-07-19 DIAGNOSIS — R0602 Shortness of breath: Secondary | ICD-10-CM | POA: Diagnosis not present

## 2013-07-19 DIAGNOSIS — I2510 Atherosclerotic heart disease of native coronary artery without angina pectoris: Secondary | ICD-10-CM

## 2013-07-19 NOTE — Patient Instructions (Addendum)
Stay on your current medicines  See Dr. Rayann Heman back as planned  Continue to track your BP and your HR  Call the [place] office at [phone] if you have any questions, problems or concerns.

## 2013-07-19 NOTE — Progress Notes (Signed)
Melanie Whitehead Date of Birth: [birth year] Medical Record #[id number]  History of Present Illness: Melanie Whitehead is seen back today for a 6 week check - seen for Dr. Rayann Heman. She has a history of paroxysmal atrial fibrillation, bilateral subclavian artery stenosis, mitral regurgitation, HTN, HL, GERD, and PVD.   She was seen in March of 2014 after a recent admission to the hospital with recurrent atrial fibrillation. This was complicated by acute on chronic diastolic CHF. She was placed on amiodarone. She underwent cardioversion. She was last seen by Dr. Rayann Heman 05/10/13. She remained in sinus rhythm. Her ECG demonstrated 1:1 versus 2:1 AV conduction. Therefore, diltiazem was stopped and her amiodarone dose was decreased.  Seen by Richardson Dopp, PA back in April - seemed to be doing ok but with some atypical CP - Myoview updated and this turned out ok.   Saw Dr. Rayann Heman 6 weeks ago. Remained in sinus rhythm. Referred for PFTs - these turned out basically ok  Comes back today. Here alone. She is doing well. Breathing is ok. Some atypical CP in the mornings - but overall she is better. HR will sometimes drift to the high 40's and she will feel more fatigued but is not consistent. No syncope. BP of at home. Not able to be on BB or CCB therapy. She is trying to be as active as possible.    Studies:  - LHC (11/2009): EF 70-75%, prox to mid LAD, 30-40.  - Echo (05/17/13): Mod LVH, EF 45-50%, no RWMA, mild AI, mod MR, MAC, mild LAE, mod TR, PASP 35 mmHg, trivial eff.  - Echo (12/2010): EF 65-70%  - Nuclear (10/2008): No scar or ischemia, EF 67%  - Nuclear (05/2013) Normal with normal LV function    Current Outpatient Prescriptions  Medication Sig Dispense Refill  . amiodarone (PACERONE) 200 MG tablet Take 1 tablet (200 mg total) by mouth daily.  90 tablet  3  . Biotin ([employer] BIOTIN HIGH POTENCY) 1000 MCG tablet Take 1,000 mcg by mouth daily.      . Calcium Carb-Cholecalciferol 600-400 MG-UNIT TABS Take 1  tablet by mouth 2 (two) times daily.       . Coenzyme Q10 (CO Q 10) 100 MG CAPS Take 1 capsule by mouth daily.       . CRESTOR 5 MG tablet TAKE ONE TABLET BY MOUTH ONCE DAILY  90 tablet  0  . dabigatran (PRADAXA) 75 MG CAPS Take 1 capsule (75 mg total) by mouth every 12 (twelve) hours.  180 capsule  3  . denosumab (PROLIA) 60 MG/ML SOLN injection Inject 60 mg into the skin every 6 (six) months. Administer in upper arm, thigh, or abdomen      . furosemide (LASIX) 20 MG tablet Take 0.5 tablets by mouth daily.       . Multiple Vitamin (MULTIVITAMIN) tablet Take 1 tablet by mouth daily.        . nitroGLYCERIN (NITRODUR - DOSED IN MG/24 HR) 0.4 mg/hr patch Place 0.4 mg onto the skin daily.       . nitroGLYCERIN (NITROSTAT) 0.4 MG SL tablet Place 1 tablet (0.4 mg total) under the tongue every 5 (five) minutes as needed.  25 tablet  3  . rOPINIRole (REQUIP) 0.5 MG tablet Take 0.5 mg by mouth at bedtime.       . rosuvastatin (CRESTOR) 5 MG tablet Take 5 mg by mouth daily.      . valsartan (DIOVAN) 160 MG tablet Take 1 tablet by  mouth daily.       No current facility-administered medications for this visit.    Allergies  Allergen Reactions  . Sulfa Antibiotics Nausea And Vomiting  . Sulfamethoxazole     REACTION: unspecified    Past Medical History  Diagnosis Date  . BUNDLE BRANCH BLOCK, LEFT 07/09/2006  . CAROTID BRUIT, LEFT 07/08/2007  . COLONIC POLYPS, HX OF 07/09/2006  . DEGENERATIVE JOINT DISEASE 07/09/2006  . GERD 07/13/2008  . HYPERLIPIDEMIA 07/09/2006  . HYPERTENSION 07/09/2006  . OSTEOPOROSIS 07/09/2006  . PAROXYSMAL ATRIAL FIBRILLATION     a. Dx 09/2009;  b. chronic pradaxa;  c. recurrence 04/2013->amiodarone initiated.  Marland Kitchen PVD (peripheral vascular disease)     a. moderate R and severe L subclavian artery stenosis  . CAD (coronary artery disease)     a. 2011 Cath: minimal, nonobstructive dzs.  Marland Kitchen Hx of colonoscopy   . Left ventricular outflow tract obstruction     a. due to dynamic  obstruction with LVH  . Moderate mitral regurgitation     a. 12/2010 Echo: EF 65-70%, mild AI, mod MR, mod-sev dil LA, PASP 56mmHg.  . SCC (squamous cell carcinoma)   . Restless leg syndrome   . Osteopenia   . LVH (left ventricular hypertrophy)   . CHF (congestive heart failure)   . Atrial fibrillation   . Esophageal reflux   . Labial cyst   . Hx of cardiovascular stress test 2015    Lexiscan Myoview (05/2013):  No ischemia, EF 58%, normal study    Past Surgical History  Procedure Laterality Date  . Two para two      abortus zero  . Tonsillectomy    . 2-d echo  12/2005    revealed normal LV function and size.  No wall motion at around his  moderate mitral annular calcification.  Elevated LVOT gradient of 22 thought secondary to narrow outflow  tract aortic valve appeared to open well  . Low risk cardiolite  12/2005  . Cataract extraction Bilateral   . Glaucoma surgery    . Pars plana vitrectomy w/ repair of macular hole    . Cardioversion N/A 05/05/2013    Procedure: CARDIOVERSION;  Surgeon: [name], MD;  Location: [facility] ENDOSCOPY;  Service: Cardiovascular;  Laterality: N/A;    History  Smoking status  . Never Smoker   Smokeless tobacco  . Never Used    History  Alcohol Use No    Family History  Problem Relation Age of Onset  . Coronary artery disease Mother     Died age 55  . Heart attack Mother   . Colon cancer    . Lung cancer    . COPD    . Diabetes    . Stroke Father     Review of Systems: The review of systems is per the HPI.  All other systems were reviewed and are negative.  Physical Exam: BP 160/50  Pulse 77  Ht 5' (1.524 m)  Wt 133 lb 1.9 oz (60.383 kg)  BMI 26.00 kg/m2  SpO2 96% Patient is very pleasant and in no acute distress. Looks younger than her stated age. Skin is warm and dry. Color is normal.  HEENT is unremarkable. Normocephalic/atraumatic. PERRL. Sclera are nonicteric. Neck is supple. No masses. No JVD. Lungs are clear. Cardiac  exam shows a regular rate and rhythm. Harsh systolic murmur noted. Abdomen is soft. Extremities are without edema. Gait and ROM are intact. No gross neurologic deficits noted.  LABORATORY DATA:  Lab Results  Component Value Date   WBC 6.0 04/29/2013   HGB 12.5 04/29/2013   HCT 36.8 04/29/2013   PLT 155 04/29/2013   GLUCOSE 96 05/11/2013   CHOL 152 06/24/2011   TRIG 155.0* 06/24/2011   HDL 42.50 06/24/2011   LDLDIRECT 101.7 07/15/2009   LDLCALC 79 06/24/2011   ALT 14 06/24/2011   AST 23 06/24/2011   NA 135 05/11/2013   K 4.3 05/11/2013   CL 102 05/11/2013   CREATININE 1.5* 05/11/2013   BUN 22 05/11/2013   CO2 24 05/11/2013   TSH 2.252 04/29/2013   INR 1.3* 05/02/2013   Myoview Impression from April 2015 Exercise Capacity: [place] with no exercise. BP Response: Normal blood pressure response. Clinical Symptoms: No significant symptoms noted. ECG Impression: No significant ST segment change suggestive of  ischemia. Comparison with Prior Nuclear Study: No significant change from  previous study  Overall Impression: Normal stress nuclear study.  LV Wall Motion: NL LV Function; NL Wall Motion   Lorretta Harp, MD      ASSESSMENT AND PLAN:  1. Paroxysmal Atrial Fibrillation: She is maintaining NSR by exam. Continue current dose of amiodarone.  Continue current dose of Pradaxa. HR will drift some to the high 40's but not consistent. She will continue to monitor. 2.   CAD with cardiac catheterization in 2011. Recent negative Myoview. Not on aspirin since she is on Pradaxa. 2. Mitral Regurgitation: Stable by recent echo. 3. Chronic Combined Systolic and Diastolic CHF: Volume appears stable. 4. Cardiomyopathy: Reduced ejection fraction is likely related to atrial fibrillation. This will hopefully return to normal with restoration of NSR. Continue ARB. Avoid beta blocker given recent history of 2:1 AV block. 5. Hypertension: Controlled. 6. Hyperlipidemia: Continue statin.   Sees Dr. Rayann Heman later  this summer.  Patient is agreeable to this plan and will call if any problems develop in the interim.   Burtis Junes, RN, [place] [address] [place], [place]  [address] [phone]

## 2013-09-05 ENCOUNTER — Other Ambulatory Visit: Payer: Self-pay | Admitting: Internal Medicine

## 2013-09-06 ENCOUNTER — Encounter: Payer: Self-pay | Admitting: Internal Medicine

## 2013-09-06 ENCOUNTER — Ambulatory Visit (INDEPENDENT_AMBULATORY_CARE_PROVIDER_SITE_OTHER): Payer: Medicare Other | Admitting: Internal Medicine

## 2013-09-06 VITALS — BP 180/52 | HR 52 | Ht 60.0 in | Wt 134.0 lb

## 2013-09-06 DIAGNOSIS — I2510 Atherosclerotic heart disease of native coronary artery without angina pectoris: Secondary | ICD-10-CM | POA: Diagnosis not present

## 2013-09-06 DIAGNOSIS — I4891 Unspecified atrial fibrillation: Secondary | ICD-10-CM | POA: Diagnosis not present

## 2013-09-06 DIAGNOSIS — I447 Left bundle-branch block, unspecified: Secondary | ICD-10-CM

## 2013-09-06 DIAGNOSIS — I480 Paroxysmal atrial fibrillation: Secondary | ICD-10-CM

## 2013-09-06 DIAGNOSIS — Q248 Other specified congenital malformations of heart: Secondary | ICD-10-CM | POA: Diagnosis not present

## 2013-09-06 DIAGNOSIS — R0602 Shortness of breath: Secondary | ICD-10-CM

## 2013-09-06 DIAGNOSIS — I10 Essential (primary) hypertension: Secondary | ICD-10-CM | POA: Diagnosis not present

## 2013-09-06 MED ORDER — AMLODIPINE BESYLATE 2.5 MG PO TABS: 2.5000 mg | ORAL_TABLET | Freq: Every day | ORAL | Status: DC

## 2013-09-06 MED ORDER — AMIODARONE HCL 200 MG PO TABS: 100.0000 mg | ORAL_TABLET | Freq: Every day | ORAL | Status: DC

## 2013-09-06 NOTE — Patient Instructions (Signed)
Your physician recommends that you schedule a follow-up appointment in 6 weeks with Truitt Merle, NP and 6 months with Dr Rayann Heman  Your physician recommends that you return for lab work in: 6 weeks at appointment with Cecille Rubin:  BMP/CBC  Your physician has recommended you make the following change in your medication:  1) Decrease Amiodarone to 100mg  daily 2) Start Amlodipine 2.5mg  daily

## 2013-09-11 NOTE — Progress Notes (Signed)
PCP: Horatio Pel, MD  The patient presents today for routine cardiology followup.  She is doing well presently.  Her SOB is stable.  She has rare fleeting chest pains at rest.  She denies exertional chest pain.  Today, she denies symptoms of palpitations,  orthopnea, PND, dizziness, presyncope, syncope, or neurologic sequela.    The patient feels that she is tolerating medications without difficulties and is otherwise without complaint today.   Past Medical History  Diagnosis Date  . BUNDLE BRANCH BLOCK, LEFT 07/09/2006  . CAROTID BRUIT, LEFT 07/08/2007  . COLONIC POLYPS, HX OF 07/09/2006  . DEGENERATIVE JOINT DISEASE 07/09/2006  . GERD 07/13/2008  . HYPERLIPIDEMIA 07/09/2006  . HYPERTENSION 07/09/2006  . OSTEOPOROSIS 07/09/2006  . PAROXYSMAL ATRIAL FIBRILLATION     a. Dx 09/2009;  b. chronic pradaxa;  c. recurrence 04/2013->amiodarone initiated.  Marland Kitchen PVD (peripheral vascular disease)     a. moderate R and severe L subclavian artery stenosis  . CAD (coronary artery disease)     a. 2011 Cath: minimal, nonobstructive dzs.  Marland Kitchen Hx of colonoscopy   . Left ventricular outflow tract obstruction     a. due to dynamic obstruction with LVH  . Moderate mitral regurgitation     a. 12/2010 Echo: EF 65-70%, mild AI, mod MR, mod-sev dil LA, PASP 80mmHg.  . SCC (squamous cell carcinoma)   . Restless leg syndrome   . Osteopenia   . LVH (left ventricular hypertrophy)   . CHF (congestive heart failure)   . Atrial fibrillation   . Esophageal reflux   . Labial cyst   . Hx of cardiovascular stress test 2015    Lexiscan Myoview (05/2013):  No ischemia, EF 58%, normal study   Past Surgical History  Procedure Laterality Date  . Two para two      abortus zero  . Tonsillectomy    . 2-d echo  12/2005    revealed normal LV function and size.  No wall motion at around his  moderate mitral annular calcification.  Elevated LVOT gradient of 22 thought secondary to narrow outflow  tract aortic valve appeared to  open well  . Low risk cardiolite  12/2005  . Cataract extraction Bilateral   . Glaucoma surgery    . Pars plana vitrectomy w/ repair of macular hole    . Cardioversion N/A 05/05/2013    Procedure: CARDIOVERSION;  Surgeon: [name], MD;  Location: [facility] ENDOSCOPY;  Service: Cardiovascular;  Laterality: N/A;    Current Outpatient Prescriptions  Medication Sig Dispense Refill  . amiodarone (PACERONE) 200 MG tablet Take 0.5 tablets (100 mg total) by mouth daily.  90 tablet  3  . Biotin ([employer] BIOTIN HIGH POTENCY) 1000 MCG tablet Take 1,000 mcg by mouth daily.      . Calcium Carb-Cholecalciferol 600-400 MG-UNIT TABS Take 1 tablet by mouth 2 (two) times daily.       . Coenzyme Q10 (CO Q 10) 100 MG CAPS Take 1 capsule by mouth daily.       Marland Kitchen denosumab (PROLIA) 60 MG/ML SOLN injection Inject 60 mg into the skin every 6 (six) months. Administer in upper arm, thigh, or abdomen      . furosemide (LASIX) 20 MG tablet Take 0.5 tablets by mouth daily.       . Multiple Vitamin (MULTIVITAMIN) tablet Take 1 tablet by mouth daily.        . nitroGLYCERIN (NITRODUR - DOSED IN MG/24 HR) 0.4 mg/hr patch Place 0.4 mg onto the  skin daily.       . nitroGLYCERIN (NITROSTAT) 0.4 MG SL tablet Place 1 tablet (0.4 mg total) under the tongue every 5 (five) minutes as needed.  25 tablet  3  . PRADAXA 75 MG CAPS capsule TAKE ONE CAPSULE BY MOUTH EVERY 12 HOURS  180 capsule  0  . rOPINIRole (REQUIP) 0.5 MG tablet Take 0.5 mg by mouth at bedtime.       . rosuvastatin (CRESTOR) 5 MG tablet Take 5 mg by mouth daily.      . valsartan (DIOVAN) 160 MG tablet Take 1 tablet by mouth daily.      Marland Kitchen amLODipine (NORVASC) 2.5 MG tablet Take 1 tablet (2.5 mg total) by mouth daily.  180 tablet  3   No current facility-administered medications for this visit.    Allergies  Allergen Reactions  . Sulfa Antibiotics Nausea And Vomiting  . Sulfamethoxazole     REACTION: unspecified    History   Social History  . Marital Status:  Married    Spouse Name: N/A    Number of Children: N/A  . Years of Education: N/A   Occupational History  . Not on file.   Social History Main Topics  . Smoking status: Never Smoker   . Smokeless tobacco: Never Used  . Alcohol Use: No  . Drug Use: No  . Sexual Activity: Not Currently   Other Topics Concern  . Not on file   Social History Narrative  . No narrative on file    Family History  Problem Relation Age of Onset  . Coronary artery disease Mother     Died age 46  . Heart attack Mother   . Colon cancer    . Lung cancer    . COPD    . Diabetes    . Stroke Father    Physical Exam: Filed Vitals:   09/06/13 1435  BP: 180/52  Pulse: 52  Height: 5' (1.524 m)  Weight: 134 lb (60.782 kg)    GEN- The patient is well appearing, alert and oriented x 3 today.   Head- normocephalic, atraumatic Eyes-  Sclera clear, conjunctiva pink Ears- hearing intact Oropharynx- clear Neck- supple, no JVP Lymph- no cervical lymphadenopathy Lungs- Clear to ausculation bilaterally, normal work of breathing Heart- Regular rate and rhythm,  2/6 SEM LUSB, early peaking, GI- soft, NT, ND, + BS Extremities- no clubbing, cyanosis, no edema MS- age appropriate muscle atrophy Neuro- strength and sensation are intact  EKG today reveals sinus rhythm 52 bpm, LBBB.    Assessment and Plan:  1. afib Maintaining sinus rhythm on amiodarone Continue pradaxa--> check cbc and bmet upon return Decrease amiodarone to 100mg  daily  2. LV hypertrophy with hyperdynamic LV Maintain preload, avoid dehydration Add amlodipine today No changes today  3. HL Stable No change required today  4. SOB Likely due to LV obstruction Add amlodipine  5. Hypertensive Elevated blood pressure Add amlodipine 2.5mg  daily  6. Venous insufficiency Support hose may be beneficial Will follow edema with initiation of amlodipine  Return to see Cecille Rubin in 6 weeks I will see again in 6 months

## 2013-09-20 ENCOUNTER — Other Ambulatory Visit: Payer: Self-pay

## 2013-09-20 MED ORDER — VALSARTAN 160 MG PO TABS: 160.0000 mg | ORAL_TABLET | Freq: Every day | ORAL | Status: DC

## 2013-10-09 ENCOUNTER — Other Ambulatory Visit: Payer: Self-pay | Admitting: Internal Medicine

## 2013-10-11 DIAGNOSIS — M810 Age-related osteoporosis without current pathological fracture: Secondary | ICD-10-CM | POA: Diagnosis not present

## 2013-10-18 ENCOUNTER — Encounter: Payer: Self-pay | Admitting: Nurse Practitioner

## 2013-10-18 ENCOUNTER — Other Ambulatory Visit: Payer: Medicare Other

## 2013-10-18 ENCOUNTER — Ambulatory Visit (INDEPENDENT_AMBULATORY_CARE_PROVIDER_SITE_OTHER): Payer: Medicare Other | Admitting: Nurse Practitioner

## 2013-10-18 VITALS — BP 170/60 | HR 51 | Ht 60.0 in | Wt 134.8 lb

## 2013-10-18 DIAGNOSIS — H3530 Unspecified macular degeneration: Secondary | ICD-10-CM | POA: Diagnosis not present

## 2013-10-18 DIAGNOSIS — R0602 Shortness of breath: Secondary | ICD-10-CM

## 2013-10-18 DIAGNOSIS — I2510 Atherosclerotic heart disease of native coronary artery without angina pectoris: Secondary | ICD-10-CM

## 2013-10-18 DIAGNOSIS — I4891 Unspecified atrial fibrillation: Secondary | ICD-10-CM | POA: Diagnosis not present

## 2013-10-18 DIAGNOSIS — Q248 Other specified congenital malformations of heart: Secondary | ICD-10-CM

## 2013-10-18 DIAGNOSIS — I480 Paroxysmal atrial fibrillation: Secondary | ICD-10-CM

## 2013-10-18 DIAGNOSIS — I10 Essential (primary) hypertension: Secondary | ICD-10-CM

## 2013-10-18 LAB — CBC WITH DIFFERENTIAL/PLATELET
Basophils Absolute: 0 10*3/uL (ref 0.0–0.1)
Basophils Relative: 0.3 % (ref 0.0–3.0)
Eosinophils Absolute: 0.1 10*3/uL (ref 0.0–0.7)
Eosinophils Relative: 1.4 % (ref 0.0–5.0)
HCT: 38.6 % (ref 36.0–46.0)
Hemoglobin: 13 g/dL (ref 12.0–15.0)
Lymphocytes Relative: 25.4 % (ref 12.0–46.0)
Lymphs Abs: 1.7 10*3/uL (ref 0.7–4.0)
MCHC: 33.7 g/dL (ref 30.0–36.0)
MCV: 90.6 fl (ref 78.0–100.0)
Monocytes Absolute: 0.6 10*3/uL (ref 0.1–1.0)
Monocytes Relative: 9.4 % (ref 3.0–12.0)
Neutro Abs: 4.3 10*3/uL (ref 1.4–7.7)
Neutrophils Relative %: 63.5 % (ref 43.0–77.0)
Platelets: 155 10*3/uL (ref 150.0–400.0)
RBC: 4.26 Mil/uL (ref 3.87–5.11)
RDW: 14.4 % (ref 11.5–15.5)
WBC: 6.8 10*3/uL (ref 4.0–10.5)

## 2013-10-18 LAB — BASIC METABOLIC PANEL
BUN: 26 mg/dL — ABNORMAL HIGH (ref 6–23)
CO2: 26 mEq/L (ref 19–32)
Calcium: 9.3 mg/dL (ref 8.4–10.5)
Chloride: 106 mEq/L (ref 96–112)
Creatinine, Ser: 1.7 mg/dL — ABNORMAL HIGH (ref 0.4–1.2)
GFR: 30.4 mL/min — ABNORMAL LOW (ref >60.00)
Glucose, Bld: 80 mg/dL (ref 70–99)
Potassium: 4.8 mEq/L (ref 3.5–5.1)
Sodium: 138 mEq/L (ref 135–145)

## 2013-10-18 LAB — HEPATIC FUNCTION PANEL
ALT: 24 U/L (ref 0–35)
AST: 25 U/L (ref 0–37)
Albumin: 3.9 g/dL (ref 3.5–5.2)
Alkaline Phosphatase: 66 U/L (ref 39–117)
Bilirubin, Direct: 0.1 mg/dL (ref 0.0–0.3)
Total Bilirubin: 0.7 mg/dL (ref 0.2–1.2)
Total Protein: 6.9 g/dL (ref 6.0–8.3)

## 2013-10-18 LAB — TSH: TSH: 4.35 u[IU]/mL (ref 0.35–4.50)

## 2013-10-18 MED ORDER — AMLODIPINE BESYLATE 2.5 MG PO TABS: 2.5000 mg | ORAL_TABLET | Freq: Two times a day (BID) | ORAL | Status: DC

## 2013-10-18 MED ORDER — NITROGLYCERIN 0.4 MG SL SUBL
0.4000 mg | SUBLINGUAL_TABLET | SUBLINGUAL | Status: DC | PRN
Start: 2013-10-18 — End: 2014-10-31

## 2013-10-18 NOTE — Progress Notes (Signed)
[name] Date of Birth: 1924/10/03 Medical Record #[id number]  History of Present Illness: Ms. Liotta is seen back today for a 6 week check - seen for Dr. Rayann Heman. She has a history of paroxysmal atrial fibrillation, bilateral subclavian artery stenosis, mitral regurgitation, HTN, HL, GERD, and PVD.  She was seen in March of 2014 after a recent admission to the hospital with recurrent atrial fibrillation. This was complicated by acute on chronic diastolic CHF. She was placed on amiodarone. She underwent cardioversion. She was seen by Dr. Rayann Heman 05/10/13. She remained in sinus rhythm. Her ECG demonstrated 1:1 versus 2:1 AV conduction. Therefore, diltiazem was stopped and her amiodarone dose was decreased.   Seen by Richardson Dopp, PA back in April - seemed to be doing ok but with some atypical CP - Myoview updated and this turned out ok.   I saw her back in June. She was doing ok.   Saw Dr. Rayann Heman 6 weeks ago. Remained in sinus rhythm. Remained in sinus. He cut her amiodarone back further. Has LV hypertrophy with hyperdynamic LV. He added Norvasc. Encouraged support stockings as well.   Comes back today. Here alone. Needs follow up labs today. She is doing well. Breathing is about the same. Her readings are ok at home but she has bought a new cuff - using a large cuff and not a regular size one. Has not had checked as well. Feels ok on her medicines. No swelling. Not dizzy or lightheaded. Rhythm remains good. She is thinking about going back to the dermatologist to have some spots looked at. Asking about having her eyes done.    Current Outpatient Prescriptions  Medication Sig Dispense Refill  . amiodarone (PACERONE) 200 MG tablet Take 0.5 tablets (100 mg total) by mouth daily.  90 tablet  3  . amLODipine (NORVASC) 2.5 MG tablet Take 1 tablet (2.5 mg total) by mouth daily.  180 tablet  3  . Biotin ([employer] BIOTIN HIGH POTENCY) 1000 MCG tablet Take 1,000 mcg by mouth daily.      . Calcium  Carb-Cholecalciferol 600-400 MG-UNIT TABS Take 1 tablet by mouth 2 (two) times daily.       . Coenzyme Q10 (CO Q 10) 100 MG CAPS Take 1 capsule by mouth daily.       . CRESTOR 5 MG tablet TAKE ONE TABLET BY MOUTH ONCE DAILY  90 tablet  0  . denosumab (PROLIA) 60 MG/ML SOLN injection Inject 60 mg into the skin every 6 (six) months. Administer in upper arm, thigh, or abdomen      . furosemide (LASIX) 20 MG tablet Take 0.5 tablets by mouth daily.       . Multiple Vitamin (MULTIVITAMIN) tablet Take 1 tablet by mouth daily.        . nitroGLYCERIN (NITRODUR - DOSED IN MG/24 HR) 0.4 mg/hr patch Place 0.4 mg onto the skin daily. Takes  patch off after 12 hours, only uses once a day      . nitroGLYCERIN (NITROSTAT) 0.4 MG SL tablet Place 1 tablet (0.4 mg total) under the tongue every 5 (five) minutes as needed.  25 tablet  3  . PRADAXA 75 MG CAPS capsule TAKE ONE CAPSULE BY MOUTH EVERY 12 HOURS  180 capsule  0  . rOPINIRole (REQUIP) 0.5 MG tablet Take 0.5 mg by mouth at bedtime.       . valsartan (DIOVAN) 160 MG tablet Take 1 tablet (160 mg total) by mouth daily.  30 tablet  3   No current facility-administered medications for this visit.    Allergies  Allergen Reactions  . Sulfa Antibiotics Nausea And Vomiting  . Sulfamethoxazole     REACTION: unspecified    Past Medical History  Diagnosis Date  . BUNDLE BRANCH BLOCK, LEFT 07/09/2006  . CAROTID BRUIT, LEFT 07/08/2007  . COLONIC POLYPS, HX OF 07/09/2006  . DEGENERATIVE JOINT DISEASE 07/09/2006  . GERD 07/13/2008  . HYPERLIPIDEMIA 07/09/2006  . HYPERTENSION 07/09/2006  . OSTEOPOROSIS 07/09/2006  . PAROXYSMAL ATRIAL FIBRILLATION     a. Dx 09/2009;  b. chronic pradaxa;  c. recurrence 04/2013->amiodarone initiated.  Marland Kitchen PVD (peripheral vascular disease)     a. moderate R and severe L subclavian artery stenosis  . CAD (coronary artery disease)     a. 2011 Cath: minimal, nonobstructive dzs.  Marland Kitchen Hx of colonoscopy   . Left ventricular outflow tract  obstruction     a. due to dynamic obstruction with LVH  . Moderate mitral regurgitation     a. 12/2010 Echo: EF 65-70%, mild AI, mod MR, mod-sev dil LA, PASP 66mmHg.  . SCC (squamous cell carcinoma)   . Restless leg syndrome   . Osteopenia   . LVH (left ventricular hypertrophy)   . CHF (congestive heart failure)   . Atrial fibrillation   . Esophageal reflux   . Labial cyst   . Hx of cardiovascular stress test 2015    Lexiscan Myoview (05/2013):  No ischemia, EF 58%, normal study    Past Surgical History  Procedure Laterality Date  . Two para two      abortus zero  . Tonsillectomy    . 2-d echo  12/2005    revealed normal LV function and size.  No wall motion at around his  moderate mitral annular calcification.  Elevated LVOT gradient of 22 thought secondary to narrow outflow  tract aortic valve appeared to open well  . Low risk cardiolite  12/2005  . Cataract extraction Bilateral   . Glaucoma surgery    . Pars plana vitrectomy w/ repair of macular hole    . Cardioversion N/A 05/05/2013    Procedure: CARDIOVERSION;  Surgeon: [name], MD;  Location: [facility] ENDOSCOPY;  Service: Cardiovascular;  Laterality: N/A;    History  Smoking status  . Never Smoker   Smokeless tobacco  . Never Used    History  Alcohol Use No    Family History  Problem Relation Age of Onset  . Coronary artery disease Mother     Died age 6  . Heart attack Mother   . Colon cancer    . Lung cancer    . COPD    . Diabetes    . Stroke Father     Review of Systems: The review of systems is per the HPI.  All other systems were reviewed and are negative.  Physical Exam: BP 170/60  Pulse 51  Ht 5' (1.524 m)  Wt 134 lb 12.8 oz (61.145 kg)  BMI 26.33 kg/m2  SpO2 98% BP 190/60 by me. Patient is very pleasant and in no acute distress. Skin is warm and dry. Color is normal.  HEENT is unremarkable. Normocephalic/atraumatic. PERRL. Sclera are nonicteric. Neck is supple. No masses. No JVD. Lungs  are clear. Cardiac exam shows a regular rate and rhythm. Abdomen is soft. Extremities are without edema. Gait and ROM are intact. No gross neurologic deficits noted.  Wt Readings from Last 3 Encounters:  10/18/13 134 lb 12.8 oz (61.145  kg)  09/06/13 134 lb (60.782 kg)  07/19/13 133 lb 1.9 oz (60.383 kg)    LABORATORY DATA/PROCEDURES:  Lab Results  Component Value Date   WBC 6.0 04/29/2013   HGB 12.5 04/29/2013   HCT 36.8 04/29/2013   PLT 155 04/29/2013   GLUCOSE 96 05/11/2013   CHOL 152 06/24/2011   TRIG 155.0* 06/24/2011   HDL 42.50 06/24/2011   LDLDIRECT 101.7 07/15/2009   LDLCALC 79 06/24/2011   ALT 14 06/24/2011   AST 23 06/24/2011   NA 135 05/11/2013   K 4.3 05/11/2013   CL 102 05/11/2013   CREATININE 1.5* 05/11/2013   BUN 22 05/11/2013   CO2 24 05/11/2013   TSH 2.252 04/29/2013   INR 1.3* 05/02/2013    BNP (last 3 results)  Recent Labs  04/29/13 1711  PROBNP 6412.0*   Myoview Impression from April 2015 Exercise Capacity: [place] with no exercise. BP Response: Normal blood pressure response. Clinical Symptoms: No significant symptoms noted. ECG Impression: No significant ST segment change suggestive of  ischemia. Comparison with Prior Nuclear Study: No significant change from  previous study  Overall Impression: Normal stress nuclear study.  LV Wall Motion: NL LV Function; NL Wall Motion   Lorretta Harp, MD   Assessment / Plan: 1. AF - on low dose amiodarone - remains in sinus - check surveillance labs today.   2. LV hypertrophy with hyperdynamic LV - on low dose Norvasc  3. HLD  4. Dyspnea -  unchanged  5. HTN - BP still quite elevated. I do not think her readings at home are satisfactory given that she has the wrong cuff size. Norvasc increased to BID. See back in 2 weeks with a new BP cuff.   6. Venous insufficiency/swelling - stable - none on exam today.  Patient is agreeable to this plan and will call if any problems develop in the interim.   Burtis Junes, RN, [place] [address] [place], [place]  [address] [phone]

## 2013-10-18 NOTE — Patient Instructions (Addendum)
Stay on your current medicines but increase the Norvasc (amlodipine) to twice a day - I have sent the new prescription to the drug store  Get a new BP cuff - bring to your next visit with me  See me in 2 weeks  See Dr. Rayann Heman in March of 2016  Call the [place] office at [phone] if you have any questions, problems or concerns.

## 2013-11-01 ENCOUNTER — Encounter: Payer: Self-pay | Admitting: Nurse Practitioner

## 2013-11-01 ENCOUNTER — Ambulatory Visit (INDEPENDENT_AMBULATORY_CARE_PROVIDER_SITE_OTHER): Payer: Medicare Other | Admitting: Nurse Practitioner

## 2013-11-01 VITALS — BP 144/68 | HR 53 | Ht 60.0 in | Wt 134.1 lb

## 2013-11-01 DIAGNOSIS — I4891 Unspecified atrial fibrillation: Secondary | ICD-10-CM | POA: Diagnosis not present

## 2013-11-01 DIAGNOSIS — Q248 Other specified congenital malformations of heart: Secondary | ICD-10-CM | POA: Diagnosis not present

## 2013-11-01 DIAGNOSIS — R001 Bradycardia, unspecified: Secondary | ICD-10-CM

## 2013-11-01 DIAGNOSIS — I480 Paroxysmal atrial fibrillation: Secondary | ICD-10-CM

## 2013-11-01 DIAGNOSIS — I10 Essential (primary) hypertension: Secondary | ICD-10-CM

## 2013-11-01 DIAGNOSIS — I2510 Atherosclerotic heart disease of native coronary artery without angina pectoris: Secondary | ICD-10-CM | POA: Diagnosis not present

## 2013-11-01 DIAGNOSIS — R0602 Shortness of breath: Secondary | ICD-10-CM

## 2013-11-01 DIAGNOSIS — I498 Other specified cardiac arrhythmias: Secondary | ICD-10-CM

## 2013-11-01 NOTE — Patient Instructions (Signed)
Stay on your current medicines  Continue to monitor your blood pressure at home - just a couple of times a week  See Dr. Rayann Heman in March of 2016  See me in 3 months   Let me know if you have any dizzy or lightheaded spells  Call the [place] office at [phone] if you have any questions, problems or concerns.

## 2013-11-01 NOTE — Progress Notes (Signed)
Melanie Whitehead Date of Birth: [birth year] Medical Record #[id number]  History of Present Illness: Melanie Whitehead is seen back today for a 2 week check - seen for Dr. Rayann Heman. She has a history of paroxysmal atrial fibrillation, bilateral subclavian artery stenosis, mitral regurgitation, HTN, HL, GERD, and PVD.   She was seen in March of 2014 after a recent admission to the hospital with recurrent atrial fibrillation. This was complicated by acute on chronic diastolic CHF. She was placed on amiodarone. She underwent cardioversion. She was seen by Dr. Rayann Heman 05/10/13. She remained in sinus rhythm. Her ECG demonstrated 1:1 versus 2:1 AV conduction. Therefore, diltiazem was stopped and her amiodarone dose was decreased.   Seen by Richardson Dopp, PA back in April - seemed to be doing ok but with some atypical CP - Myoview updated and this turned out ok.   I saw her back in June. She was doing ok.   Saw Dr. Rayann Heman 8 weeks ago. Remained in sinus rhythm. He cut her amiodarone back further. Had LV hypertrophy with hyperdynamic LV on echo. He added Norvasc. Encouraged support stockings as well.   I saw her 2 weeks ago - her BP cuff was not accurate (was using wrong cuff size) and BP was up here - I increased her Norvasc. Her breathing had improved with the Norvasc.   Comes back today. Here alone. Doing ok. Her BP cuff is checked today (with a regular size cuff today). She remains fairly active. Has continued to have some atypical chest pains - this is chronic. Notes her HR in the 50 to 60's but will have some readings in the 40's. On low dose amiodarone. No AF noted. She is not dizzy or lightheaded. No passing out spells. BP diary from home looks pretty good.    Current Outpatient Prescriptions  Medication Sig Dispense Refill  . amiodarone (PACERONE) 200 MG tablet Take 0.5 tablets (100 mg total) by mouth daily.  90 tablet  3  . amLODipine (NORVASC) 2.5 MG tablet Take 1 tablet (2.5 mg total) by mouth 2 (two)  times daily.  60 tablet  3  . Biotin ([employer] BIOTIN HIGH POTENCY) 1000 MCG tablet Take 1,000 mcg by mouth daily.      . Calcium Carb-Cholecalciferol 600-400 MG-UNIT TABS Take 1 tablet by mouth 2 (two) times daily.       . Coenzyme Q10 (CO Q 10) 100 MG CAPS Take 1 capsule by mouth daily.       . CRESTOR 5 MG tablet TAKE ONE TABLET BY MOUTH ONCE DAILY  90 tablet  0  . denosumab (PROLIA) 60 MG/ML SOLN injection Inject 60 mg into the skin every 6 (six) months. Administer in upper arm, thigh, or abdomen      . furosemide (LASIX) 20 MG tablet Take 0.5 tablets by mouth daily.       . Multiple Vitamin (MULTIVITAMIN) tablet Take 1 tablet by mouth daily.        . nitroGLYCERIN (NITRODUR - DOSED IN MG/24 HR) 0.4 mg/hr patch Place 0.4 mg onto the skin daily. Takes  patch off after 12 hours, only uses once a day      . nitroGLYCERIN (NITROSTAT) 0.4 MG SL tablet Place 1 tablet (0.4 mg total) under the tongue every 5 (five) minutes as needed.  25 tablet  3  . PRADAXA 75 MG CAPS capsule TAKE ONE CAPSULE BY MOUTH EVERY 12 HOURS  180 capsule  0  . rOPINIRole (REQUIP) 0.5 MG tablet Take  0.5 mg by mouth at bedtime.       . valsartan (DIOVAN) 160 MG tablet Take 1 tablet (160 mg total) by mouth daily.  30 tablet  3   No current facility-administered medications for this visit.    Allergies  Allergen Reactions  . Sulfa Antibiotics Nausea And Vomiting  . Sulfamethoxazole     REACTION: unspecified    Past Medical History  Diagnosis Date  . BUNDLE BRANCH BLOCK, LEFT 07/09/2006  . CAROTID BRUIT, LEFT 07/08/2007  . COLONIC POLYPS, HX OF 07/09/2006  . DEGENERATIVE JOINT DISEASE 07/09/2006  . GERD 07/13/2008  . HYPERLIPIDEMIA 07/09/2006  . HYPERTENSION 07/09/2006  . OSTEOPOROSIS 07/09/2006  . PAROXYSMAL ATRIAL FIBRILLATION     a. Dx 09/2009;  b. chronic pradaxa;  c. recurrence 04/2013->amiodarone initiated.  Marland Kitchen PVD (peripheral vascular disease)     a. moderate R and severe L subclavian artery stenosis  . CAD (coronary  artery disease)     a. 2011 Cath: minimal, nonobstructive dzs.  Marland Kitchen Hx of colonoscopy   . Left ventricular outflow tract obstruction     a. due to dynamic obstruction with LVH  . Moderate mitral regurgitation     a. 12/2010 Echo: EF 65-70%, mild AI, mod MR, mod-sev dil LA, PASP 36mmHg.  . SCC (squamous cell carcinoma)   . Restless leg syndrome   . Osteopenia   . LVH (left ventricular hypertrophy)   . CHF (congestive heart failure)   . Atrial fibrillation   . Esophageal reflux   . Labial cyst   . Hx of cardiovascular stress test 2015    Lexiscan Myoview (05/2013):  No ischemia, EF 58%, normal study    Past Surgical History  Procedure Laterality Date  . Two para two      abortus zero  . Tonsillectomy    . 2-d echo  12/2005    revealed normal LV function and size.  No wall motion at around his  moderate mitral annular calcification.  Elevated LVOT gradient of 22 thought secondary to narrow outflow  tract aortic valve appeared to open well  . Low risk cardiolite  12/2005  . Cataract extraction Bilateral   . Glaucoma surgery    . Pars plana vitrectomy w/ repair of macular hole    . Cardioversion N/A 05/05/2013    Procedure: CARDIOVERSION;  Surgeon: [name], MD;  Location: [facility] ENDOSCOPY;  Service: Cardiovascular;  Laterality: N/A;    History  Smoking status  . Never Smoker   Smokeless tobacco  . Never Used    History  Alcohol Use No    Family History  Problem Relation Age of Onset  . Coronary artery disease Mother     Died age 51  . Heart attack Mother   . Colon cancer    . Lung cancer    . COPD    . Diabetes    . Stroke Father     Review of Systems: The review of systems is per the HPI.  All other systems were reviewed and are negative.  Physical Exam: BP 144/68  Pulse 53  Ht 5' (1.524 m)  Wt 134 lb 1.9 oz (60.836 kg)  BMI 26.19 kg/m2  SpO2 98% BP by me is 180/60 and her machine reads 185/70.  Patient is very pleasant and in no acute distress. Looks  younger than her stated age. Skin is warm and dry. Color is normal.  HEENT is unremarkable. Normocephalic/atraumatic. PERRL. Sclera are nonicteric. Neck is supple. No masses.  No JVD. Lungs are clear. Cardiac exam shows a regular rate and rhythm. Abdomen is soft. Extremities are without edema. Gait and ROM are intact. No gross neurologic deficits noted.  Wt Readings from Last 3 Encounters:  11/01/13 134 lb 1.9 oz (60.836 kg)  10/18/13 134 lb 12.8 oz (61.145 kg)  09/06/13 134 lb (60.782 kg)    LABORATORY DATA/PROCEDURES:  Lab Results  Component Value Date   WBC 6.8 10/18/2013   HGB 13.0 10/18/2013   HCT 38.6 10/18/2013   PLT 155.0 10/18/2013   GLUCOSE 80 10/18/2013   CHOL 152 06/24/2011   TRIG 155.0* 06/24/2011   HDL 42.50 06/24/2011   LDLDIRECT 101.7 07/15/2009   LDLCALC 79 06/24/2011   ALT 24 10/18/2013   AST 25 10/18/2013   NA 138 10/18/2013   K 4.8 10/18/2013   CL 106 10/18/2013   CREATININE 1.7* 10/18/2013   BUN 26* 10/18/2013   CO2 26 10/18/2013   TSH 4.35 10/18/2013   INR 1.3* 05/02/2013    BNP (last 3 results)  Recent Labs  04/29/13 1711  PROBNP 6412.0*   Myoview Impression from April 2015 Exercise Capacity: [place] with no exercise. BP Response: Normal blood pressure response. Clinical Symptoms: No significant symptoms noted. ECG Impression: No significant ST segment change suggestive of  ischemia. Comparison with Prior Nuclear Study: No significant change from  previous study  Overall Impression: Normal stress nuclear study.  LV Wall Motion: NL LV Function; NL Wall Motion   Lorretta Harp, MD   Assessment / Plan:  1. AF - on low dose amiodarone - remains in sinus - she has noted some bradycardia at home. HR is 52 on my exam today. She is on no other rate slowing medicines and is already on low dose amiodarone at 100 mg per day. She is asymptomatic. As long as she is asymptomatic will continue to monitor - but if she becomes symptomatic then I would consider Holter and need to  discuss possible PPM.   2. LV hypertrophy with hyperdynamic LV - on low dose Norvasc - stable  3. HLD   4. Dyspnea - stable.   5. HTN - Norvasc increased at her last visit with me. Her diary from home looks pretty good with stable readings. Her cuff does correlate. I have left her on her current regimen for now.   6. Venous insufficiency/swelling - stable - none on exam today.  See back in 3 months. She is seeing Dr. Rayann Heman in March of 2016.   Patient is agreeable to this plan and will call if any problems develop in the interim.   Burtis Junes, RN, [place] [address] [place], [place]  [address] [phone]

## 2013-11-17 DIAGNOSIS — Z23 Encounter for immunization: Secondary | ICD-10-CM | POA: Diagnosis not present

## 2013-11-24 ENCOUNTER — Telehealth: Payer: Self-pay | Admitting: Internal Medicine

## 2013-11-24 NOTE — Telephone Encounter (Signed)
ERROR

## 2013-11-29 ENCOUNTER — Encounter: Payer: Self-pay | Admitting: Internal Medicine

## 2013-12-04 ENCOUNTER — Other Ambulatory Visit: Payer: Self-pay | Admitting: Internal Medicine

## 2013-12-11 ENCOUNTER — Telehealth: Payer: Self-pay | Admitting: Internal Medicine

## 2013-12-11 NOTE — Telephone Encounter (Signed)
New message            Pt daughter has faxed some papers over to be able to leave work to take pt to appt's / have you received them

## 2013-12-11 NOTE — Telephone Encounter (Addendum)
Spoke with patient's daughter and let her know we needed to complete new forms.  She will fax them to Maudie Mercury in Medical Records attention tomorrow

## 2013-12-14 NOTE — Telephone Encounter (Signed)
Follow up     Did you get forms this week and will we be able to complete them?

## 2013-12-14 NOTE — Telephone Encounter (Signed)
Spoke with [name] and let her know I will have Truitt Merle, NP sign the papers is Dr Jackalyn Lombard absence

## 2013-12-15 NOTE — Telephone Encounter (Signed)
Truitt Merle, NP signed papers and Kin in MR faxed

## 2014-01-01 ENCOUNTER — Other Ambulatory Visit: Payer: Self-pay | Admitting: Internal Medicine

## 2014-01-16 DIAGNOSIS — Z23 Encounter for immunization: Secondary | ICD-10-CM | POA: Diagnosis not present

## 2014-01-22 ENCOUNTER — Other Ambulatory Visit (HOSPITAL_COMMUNITY): Payer: Self-pay | Admitting: Internal Medicine

## 2014-01-22 ENCOUNTER — Ambulatory Visit (HOSPITAL_COMMUNITY)
Admission: RE | Admit: 2014-01-22 | Discharge: 2014-01-22 | Disposition: A | Discharge status: Home / Self Care | Payer: Medicare Other | Source: Ambulatory Visit | Attending: Internal Medicine | Admitting: Internal Medicine

## 2014-01-22 DIAGNOSIS — M79662 Pain in left lower leg: Secondary | ICD-10-CM | POA: Diagnosis not present

## 2014-01-22 DIAGNOSIS — M25569 Pain in unspecified knee: Secondary | ICD-10-CM

## 2014-01-22 DIAGNOSIS — M79609 Pain in unspecified limb: Secondary | ICD-10-CM | POA: Diagnosis not present

## 2014-01-22 NOTE — Progress Notes (Signed)
VASCULAR LAB PRELIMINARY  PRELIMINARY  PRELIMINARY  PRELIMINARY  Left lower extremity venous duplex completed.    Preliminary report:  Left:  No evidence of DVT, superficial thrombosis, or Baker's cyst.  Azim Gillingham, RVS 01/22/2014, 4:24 PM

## 2014-01-24 ENCOUNTER — Ambulatory Visit (INDEPENDENT_AMBULATORY_CARE_PROVIDER_SITE_OTHER): Payer: Medicare Other | Admitting: Nurse Practitioner

## 2014-01-24 ENCOUNTER — Encounter (INDEPENDENT_AMBULATORY_CARE_PROVIDER_SITE_OTHER): Payer: Medicare Other

## 2014-01-24 ENCOUNTER — Encounter: Payer: Self-pay | Admitting: Nurse Practitioner

## 2014-01-24 VITALS — BP 130/50 | HR 45 | Ht 60.0 in | Wt 135.8 lb

## 2014-01-24 DIAGNOSIS — I10 Essential (primary) hypertension: Secondary | ICD-10-CM | POA: Diagnosis not present

## 2014-01-24 DIAGNOSIS — I480 Paroxysmal atrial fibrillation: Secondary | ICD-10-CM

## 2014-01-24 DIAGNOSIS — R0602 Shortness of breath: Secondary | ICD-10-CM

## 2014-01-24 DIAGNOSIS — R001 Bradycardia, unspecified: Secondary | ICD-10-CM | POA: Diagnosis not present

## 2014-01-24 DIAGNOSIS — I2510 Atherosclerotic heart disease of native coronary artery without angina pectoris: Secondary | ICD-10-CM

## 2014-01-24 MED ORDER — FUROSEMIDE 20 MG PO TABS: 10.0000 mg | ORAL_TABLET | Freq: Every day | ORAL | Status: DC | PRN

## 2014-01-24 NOTE — Patient Instructions (Addendum)
Stay on your current medicines except take the Lasix only if needed (swelling, more shortness of breath)  We will place a 24 hour heart monitor  See me and Dr. Rayann Heman (same day) in the next 2 weeks to discuss "what the next step is"  Call the [place] office at [phone] if you have any questions, problems or concerns.

## 2014-01-24 NOTE — Progress Notes (Addendum)
Melanie Whitehead Date of Birth: Oct 10, 1924 Medical Record #[id number]  History of Present Illness: Ms. Gores is seen back today for a 3 month check - seen for Dr. Rayann Heman. She has a history of paroxysmal atrial fibrillation, bilateral subclavian artery stenosis, mitral regurgitation, HTN, HL, GERD, and PVD.   She was seen in March of 2014 after a recent admission to the hospital with recurrent atrial fibrillation. This was complicated by acute on chronic diastolic CHF. She was placed on amiodarone. She underwent cardioversion. She was seen by Dr. Rayann Heman 05/10/13. She remained in sinus rhythm. Her ECG demonstrated 1:1 versus 2:1 AV conduction. Therefore, diltiazem was stopped and her amiodarone dose was decreased.   Seen by Richardson Dopp, PA back in April - seemed to be doing ok but with some atypical CP - Myoview updated and this turned out ok.   Saw Dr. Rayann Heman back in the summer. Remained in sinus rhythm. He cut her amiodarone back further. Had LV hypertrophy with hyperdynamic LV on echo. He added Norvasc. Encouraged support stockings as well.   Last seen by me back in September. BP cuff was not accurate (was using wrong cuff size) and BP was up here - I increased her Norvasc. Her breathing had improved with the Norvasc. On return visit she was doing fairly well. No sustained bradycardia.  Comes back today. Here alone. Little bit more winded than usual. Worse with activity. HR now predominantly in the 40's. No syncope. No falls. Some chest tightness if she is short of breath. BP has improved.   Current Outpatient Prescriptions  Medication Sig Dispense Refill  . amiodarone (PACERONE) 200 MG tablet Take 0.5 tablets (100 mg total) by mouth daily. 90 tablet 3  . amLODipine (NORVASC) 2.5 MG tablet Take 1 tablet (2.5 mg total) by mouth 2 (two) times daily. 60 tablet 3  . Biotin ([employer] BIOTIN HIGH POTENCY) 1000 MCG tablet Take 1,000 mcg by mouth daily.    . Calcium Carb-Cholecalciferol 600-400 MG-UNIT  TABS Take 1 tablet by mouth 2 (two) times daily.     . Coenzyme Q10 (CO Q 10) 100 MG CAPS Take 1 capsule by mouth daily.     . CRESTOR 5 MG tablet TAKE ONE TABLET BY MOUTH ONCE DAILY 90 tablet 1  . denosumab (PROLIA) 60 MG/ML SOLN injection Inject 60 mg into the skin every 6 (six) months. Administer in upper arm, thigh, or abdomen    . furosemide (LASIX) 20 MG tablet Take 0.5 tablets by mouth daily.     . Multiple Vitamin (MULTIVITAMIN) tablet Take 1 tablet by mouth daily.      . nitroGLYCERIN (NITRODUR - DOSED IN MG/24 HR) 0.4 mg/hr patch Place 0.4 mg onto the skin daily. Takes  patch off after 12 hours, only uses once a day    . nitroGLYCERIN (NITROSTAT) 0.4 MG SL tablet Place 1 tablet (0.4 mg total) under the tongue every 5 (five) minutes as needed. 25 tablet 3  . PRADAXA 75 MG CAPS capsule TAKE ONE CAPSULE BY MOUTH EVERY 12 HOURS 180 capsule 2  . rOPINIRole (REQUIP) 0.5 MG tablet Take 0.5 mg by mouth at bedtime.     . valsartan (DIOVAN) 160 MG tablet Take 1 tablet (160 mg total) by mouth daily. 30 tablet 3   No current facility-administered medications for this visit.    Allergies  Allergen Reactions  . Sulfa Antibiotics Nausea And Vomiting  . Sulfamethoxazole     REACTION: unspecified    Past Medical History  Diagnosis Date  . BUNDLE BRANCH BLOCK, LEFT 07/09/2006  . CAROTID BRUIT, LEFT 07/08/2007  . COLONIC POLYPS, HX OF 07/09/2006  . DEGENERATIVE JOINT DISEASE 07/09/2006  . GERD 07/13/2008  . HYPERLIPIDEMIA 07/09/2006  . HYPERTENSION 07/09/2006  . OSTEOPOROSIS 07/09/2006  . PAROXYSMAL ATRIAL FIBRILLATION     a. Dx 09/2009;  b. chronic pradaxa;  c. recurrence 04/2013->amiodarone initiated.  Marland Kitchen PVD (peripheral vascular disease)     a. moderate R and severe L subclavian artery stenosis  . CAD (coronary artery disease)     a. 2011 Cath: minimal, nonobstructive dzs.  Marland Kitchen Hx of colonoscopy   . Left ventricular outflow tract obstruction     a. due to dynamic obstruction with LVH  . Moderate  mitral regurgitation     a. 12/2010 Echo: EF 65-70%, mild AI, mod MR, mod-sev dil LA, PASP 3mmHg.  . SCC (squamous cell carcinoma)   . Restless leg syndrome   . Osteopenia   . LVH (left ventricular hypertrophy)   . CHF (congestive heart failure)   . Atrial fibrillation   . Esophageal reflux   . Labial cyst   . Hx of cardiovascular stress test 2015    Lexiscan Myoview (05/2013):  No ischemia, EF 58%, normal study    Past Surgical History  Procedure Laterality Date  . Two para two      abortus zero  . Tonsillectomy    . 2-d echo  12/2005    revealed normal LV function and size.  No wall motion at around his  moderate mitral annular calcification.  Elevated LVOT gradient of 22 thought secondary to narrow outflow  tract aortic valve appeared to open well  . Low risk cardiolite  12/2005  . Cataract extraction Bilateral   . Glaucoma surgery    . Pars plana vitrectomy w/ repair of macular hole    . Cardioversion N/A 05/05/2013    Procedure: CARDIOVERSION;  Surgeon: [name], MD;  Location: [facility] ENDOSCOPY;  Service: Cardiovascular;  Laterality: N/A;    History  Smoking status  . Never Smoker   Smokeless tobacco  . Never Used    History  Alcohol Use No    Family History  Problem Relation Age of Onset  . Coronary artery disease Mother     Died age 16  . Heart attack Mother   . Colon cancer    . Lung cancer    . COPD    . Diabetes    . Stroke Father     Review of Systems: The review of systems is per the HPI.  All other systems were reviewed and are negative.  Physical Exam: BP 130/50 mmHg  Pulse 45  Ht 5' (1.524 m)  Wt 135 lb 12.8 oz (61.598 kg)  BMI 26.52 kg/m2  SpO2 98% Patient is very pleasant and in no acute distress. Skin is warm and dry. Color is normal.  HEENT is unremarkable. Normocephalic/atraumatic. PERRL. Sclera are nonicteric. Neck is supple. No masses. No JVD. Lungs are clear. Cardiac exam shows a regular rate and rhythm but it is slow. She has  an outflow murmur. Abdomen is soft. Extremities are without edema. Gait and ROM are intact. No gross neurologic deficits noted.  Wt Readings from Last 3 Encounters:  01/24/14 135 lb 12.8 oz (61.598 kg)  11/01/13 134 lb 1.9 oz (60.836 kg)  10/18/13 134 lb 12.8 oz (61.145 kg)    LABORATORY DATA/PROCEDURES:  Lab Results  Component Value Date   WBC 6.8 10/18/2013  HGB 13.0 10/18/2013   HCT 38.6 10/18/2013   PLT 155.0 10/18/2013   GLUCOSE 80 10/18/2013   CHOL 152 06/24/2011   TRIG 155.0* 06/24/2011   HDL 42.50 06/24/2011   LDLDIRECT 101.7 07/15/2009   LDLCALC 79 06/24/2011   ALT 24 10/18/2013   AST 25 10/18/2013   NA 138 10/18/2013   K 4.8 10/18/2013   CL 106 10/18/2013   CREATININE 1.7* 10/18/2013   BUN 26* 10/18/2013   CO2 26 10/18/2013   TSH 4.35 10/18/2013   INR 1.3* 05/02/2013    BNP (last 3 results)  Recent Labs  04/29/13 1711  PROBNP 6412.0*   Myoview Impression from April 2015 Exercise Capacity: [place] with no exercise. BP Response: Normal blood pressure response. Clinical Symptoms: No significant symptoms noted. ECG Impression: No significant ST segment change suggestive of  ischemia. Comparison with Prior Nuclear Study: No significant change from  previous study  Overall Impression: Normal stress nuclear study.  LV Wall Motion: NL LV Function; NL Wall Motion   Lorretta Harp, MD   Assessment / Plan: 1. Bradycardia - seeing more HR's in the 40's now. Will place a 24 hour Holter to see exactly what we are dealing with. We may be getting close to PPM implant. I do not think we can cut the amiodarone back further and she does much better in NSR.  2. PAF - sinus on exam. EKG today. Remains on Pradaxa for her anticoagulation.  3. LV hypertrophy with hyperdynamic LV - on low dose Norvasc  4. Diastolic dysfunction - ok to change lasix to just prn.  Will place a Holter. EKG today. See back next week with Dr. Rayann Heman to discuss options and possible  PPM implant.   Patient is agreeable to this plan and will call if any problems develop in the interim.   Burtis Junes, RN, ANP-C [place] [address] [place], [place]  [address] [phone]  Addendum:  EKG today shows sinus brady, LBBB, rate of 46.

## 2014-01-31 ENCOUNTER — Encounter: Payer: Self-pay | Admitting: Nurse Practitioner

## 2014-01-31 ENCOUNTER — Ambulatory Visit: Payer: Medicare Other | Admitting: Nurse Practitioner

## 2014-01-31 ENCOUNTER — Ambulatory Visit (INDEPENDENT_AMBULATORY_CARE_PROVIDER_SITE_OTHER): Payer: Medicare Other | Admitting: Nurse Practitioner

## 2014-01-31 VITALS — BP 140/60 | HR 48 | Ht 60.0 in | Wt 134.0 lb

## 2014-01-31 DIAGNOSIS — R001 Bradycardia, unspecified: Secondary | ICD-10-CM | POA: Diagnosis not present

## 2014-01-31 DIAGNOSIS — I10 Essential (primary) hypertension: Secondary | ICD-10-CM | POA: Diagnosis not present

## 2014-01-31 DIAGNOSIS — Z79899 Other long term (current) drug therapy: Secondary | ICD-10-CM | POA: Diagnosis not present

## 2014-01-31 DIAGNOSIS — I4891 Unspecified atrial fibrillation: Secondary | ICD-10-CM

## 2014-01-31 DIAGNOSIS — I2510 Atherosclerotic heart disease of native coronary artery without angina pectoris: Secondary | ICD-10-CM | POA: Diagnosis not present

## 2014-01-31 DIAGNOSIS — I480 Paroxysmal atrial fibrillation: Secondary | ICD-10-CM | POA: Diagnosis not present

## 2014-01-31 DIAGNOSIS — M79662 Pain in left lower leg: Secondary | ICD-10-CM | POA: Diagnosis not present

## 2014-01-31 MED ORDER — AMIODARONE HCL 200 MG PO TABS: 100.0000 mg | ORAL_TABLET | ORAL | Status: DC

## 2014-01-31 NOTE — Patient Instructions (Addendum)
We will be checking the following labs today - amiodarone level  Stay on your current medicines but cut the amiodarone back to 1/2 tablet (100 mg) every other day  See Dr. Rayann Heman in 6 to 8 weeks  Go to Dr. Pennie Banter office at 2pm  Call the [place] office at [phone] if you have any questions, problems or concerns.

## 2014-01-31 NOTE — Progress Notes (Signed)
Melanie Whitehead Date of Birth: March 11, 1924 Medical Record #[id number]  History of Present Illness: Melanie Whitehead is seen back today for a follow up visit after a 24 hour Holter - seen for Dr. Rayann Heman. She has a history of paroxysmal atrial fibrillation, bilateral subclavian artery stenosis, mitral regurgitation, HTN, HL, GERD, and PVD.   She was seen in March of 2014 after a recent admission to the hospital with recurrent atrial fibrillation. This was complicated by acute on chronic diastolic CHF. She was placed on amiodarone. She underwent cardioversion. She was seen by Dr. Rayann Heman 05/10/13. She remained in sinus rhythm. Her ECG demonstrated 1:1 versus 2:1 AV conduction. Therefore, diltiazem was stopped and her amiodarone dose was decreased.   Seen by Richardson Dopp, PA back in April - seemed to be doing ok but with some atypical CP - Myoview updated and this turned out ok.   Saw Dr. Rayann Heman back in the summer. Remained in sinus rhythm. He cut her amiodarone back further. Had LV hypertrophy with hyperdynamic LV on echo. He added Norvasc. Encouraged support stockings as well.   Last seen by me back in September. BP cuff was not accurate (was using wrong cuff size) and BP was up here - I increased her Norvasc. Her breathing had improved with the Norvasc. On return visit she was doing fairly well. No sustained bradycardia.  When seen earlier this month, she noted her HR was staying in the 40's. She was a little bit more winded than usual. No syncope. We placed a Holter. May have to consider PPM.   Comes back today. Here with her daughter. She is doing ok from our standpoint. She is having more issues with her left hip/leg due to pain. Has seen PCP about 2 weeks ago. Negative doppler. She remains on chronic Pradaxa. No syncope. BP ok at home. Primary concern today is her leg pain.    Current Outpatient Prescriptions  Medication Sig Dispense Refill  . amiodarone (PACERONE) 200 MG tablet Take 0.5 tablets  (100 mg total) by mouth daily. 90 tablet 3  . amLODipine (NORVASC) 2.5 MG tablet Take 1 tablet (2.5 mg total) by mouth 2 (two) times daily. 60 tablet 3  . Biotin ([employer] BIOTIN HIGH POTENCY) 1000 MCG tablet Take 1,000 mcg by mouth daily.    . Calcium Carb-Cholecalciferol 600-400 MG-UNIT TABS Take 1 tablet by mouth 2 (two) times daily.     . Coenzyme Q10 (CO Q 10) 100 MG CAPS Take 1 capsule by mouth daily.     . CRESTOR 5 MG tablet TAKE ONE TABLET BY MOUTH ONCE DAILY 90 tablet 1  . denosumab (PROLIA) 60 MG/ML SOLN injection Inject 60 mg into the skin every 6 (six) months. Administer in upper arm, thigh, or abdomen    . furosemide (LASIX) 20 MG tablet Take 0.5 tablets (10 mg total) by mouth daily as needed. 30 tablet   . Multiple Vitamin (MULTIVITAMIN) tablet Take 1 tablet by mouth daily.      . nitroGLYCERIN (NITRODUR - DOSED IN MG/24 HR) 0.4 mg/hr patch Place 0.4 mg onto the skin daily. Takes  patch off after 12 hours, only uses once a day    . nitroGLYCERIN (NITROSTAT) 0.4 MG SL tablet Place 1 tablet (0.4 mg total) under the tongue every 5 (five) minutes as needed. 25 tablet 3  . PRADAXA 75 MG CAPS capsule TAKE ONE CAPSULE BY MOUTH EVERY 12 HOURS 180 capsule 2  . rOPINIRole (REQUIP) 0.5 MG tablet Take 0.5 mg  by mouth at bedtime.     . valsartan (DIOVAN) 160 MG tablet Take 1 tablet (160 mg total) by mouth daily. 30 tablet 3   No current facility-administered medications for this visit.    Allergies  Allergen Reactions  . Sulfa Antibiotics Nausea And Vomiting  . Sulfamethoxazole     REACTION: unspecified    Past Medical History  Diagnosis Date  . BUNDLE BRANCH BLOCK, LEFT 07/09/2006  . CAROTID BRUIT, LEFT 07/08/2007  . COLONIC POLYPS, HX OF 07/09/2006  . DEGENERATIVE JOINT DISEASE 07/09/2006  . GERD 07/13/2008  . HYPERLIPIDEMIA 07/09/2006  . HYPERTENSION 07/09/2006  . OSTEOPOROSIS 07/09/2006  . PAROXYSMAL ATRIAL FIBRILLATION     a. Dx 09/2009;  b. chronic pradaxa;  c. recurrence  04/2013->amiodarone initiated.  Marland Kitchen PVD (peripheral vascular disease)     a. moderate R and severe L subclavian artery stenosis  . CAD (coronary artery disease)     a. 2011 Cath: minimal, nonobstructive dzs.  Marland Kitchen Hx of colonoscopy   . Left ventricular outflow tract obstruction     a. due to dynamic obstruction with LVH  . Moderate mitral regurgitation     a. 12/2010 Echo: EF 65-70%, mild AI, mod MR, mod-sev dil LA, PASP 57mmHg.  . SCC (squamous cell carcinoma)   . Restless leg syndrome   . Osteopenia   . LVH (left ventricular hypertrophy)   . CHF (congestive heart failure)   . Atrial fibrillation   . Esophageal reflux   . Labial cyst   . Hx of cardiovascular stress test 2015    Lexiscan Myoview (05/2013):  No ischemia, EF 58%, normal study    Past Surgical History  Procedure Laterality Date  . Two para two      abortus zero  . Tonsillectomy    . 2-d echo  12/2005    revealed normal LV function and size.  No wall motion at around his  moderate mitral annular calcification.  Elevated LVOT gradient of 22 thought secondary to narrow outflow  tract aortic valve appeared to open well  . Low risk cardiolite  12/2005  . Cataract extraction Bilateral   . Glaucoma surgery    . Pars plana vitrectomy w/ repair of macular hole    . Cardioversion N/A 05/05/2013    Procedure: CARDIOVERSION;  Surgeon: [name], MD;  Location: [facility] ENDOSCOPY;  Service: Cardiovascular;  Laterality: N/A;    History  Smoking status  . Never Smoker   Smokeless tobacco  . Never Used    History  Alcohol Use No    Family History  Problem Relation Age of Onset  . Coronary artery disease Mother     Died age 16  . Heart attack Mother   . Colon cancer    . Lung cancer    . COPD    . Diabetes    . Stroke Father     Review of Systems: The review of systems is per the HPI.  All other systems were reviewed and are negative.  Physical Exam: BP 140/60 mmHg  Pulse 48  Ht 5' (1.524 m)  Wt 134 lb  (60.782 kg)  BMI 26.17 kg/m2 Patient is very pleasant and in no acute distress. Skin is warm and dry. Color is normal.  HEENT is unremarkable. Normocephalic/atraumatic. PERRL. Sclera are nonicteric. Neck is supple. No masses. No JVD. Lungs are clear. Cardiac exam shows a regular rate and rhythm. Grating systolic murmur noted. Abdomen is soft. Extremities are without edema. Gait and ROM  are intact. No gross neurologic deficits noted.  Wt Readings from Last 3 Encounters:  01/31/14 134 lb (60.782 kg)  01/24/14 135 lb 12.8 oz (61.598 kg)  11/01/13 134 lb 1.9 oz (60.836 kg)    LABORATORY DATA/PROCEDURES:  Holter from earlier this month with mean HR of 47. Longest pause of 1.79 seconds. Reviewed with Dr. Rayann Heman.   Lab Results  Component Value Date   WBC 6.8 10/18/2013   HGB 13.0 10/18/2013   HCT 38.6 10/18/2013   PLT 155.0 10/18/2013   GLUCOSE 80 10/18/2013   CHOL 152 06/24/2011   TRIG 155.0* 06/24/2011   HDL 42.50 06/24/2011   LDLDIRECT 101.7 07/15/2009   LDLCALC 79 06/24/2011   ALT 24 10/18/2013   AST 25 10/18/2013   NA 138 10/18/2013   K 4.8 10/18/2013   CL 106 10/18/2013   CREATININE 1.7* 10/18/2013   BUN 26* 10/18/2013   CO2 26 10/18/2013   TSH 4.35 10/18/2013   INR 1.3* 05/02/2013    BNP (last 3 results)  Recent Labs  04/29/13 1711  PROBNP 6412.0*   Myoview Impression from April 2015 Exercise Capacity: [place] with no exercise. BP Response: Normal blood pressure response. Clinical Symptoms: No significant symptoms noted. ECG Impression: No significant ST segment change suggestive of  ischemia. Comparison with Prior Nuclear Study: No significant change from  previous study  Overall Impression: Normal stress nuclear study.  LV Wall Motion: NL LV Function; NL Wall Motion   Melanie Harp, MD   Assessment / Plan: 1. Bradycardia - holter reviewed with Dr. Rayann Heman earlier today. He has recommended checking an amiodarone level. Cut the amiodarone back to 100  mg QOD. He will see back in about 6 weeks to recheck. She will continue to monitor her HR at home.   2. PAF - sinus on exam.  Remains on Pradaxa for her anticoagulation.  3. LV hypertrophy with hyperdynamic LV - on low dose Norvasc  4. Diastolic dysfunction - looks compensated.   5. Left leg pain - will get her back to PCP.  Patient is agreeable to this plan and will call if any problems develop in the interim.   Burtis Junes, RN, [place] [address] [place], [place]  [address] [phone]

## 2014-02-01 DIAGNOSIS — M545 Low back pain: Secondary | ICD-10-CM | POA: Diagnosis not present

## 2014-02-06 LAB — AMIODARONE LEVEL
Amiodarone Lvl: 1.3 ug/mL — ABNORMAL LOW (ref 1.5–2.5)
Desethylamiodarone: 0.6 ug/mL — ABNORMAL LOW (ref 1.5–2.5)

## 2014-02-12 ENCOUNTER — Other Ambulatory Visit: Payer: Self-pay | Admitting: Orthopedic Surgery

## 2014-02-12 DIAGNOSIS — M545 Low back pain: Secondary | ICD-10-CM

## 2014-02-14 ENCOUNTER — Ambulatory Visit
Admission: RE | Admit: 2014-02-14 | Discharge: 2014-02-14 | Disposition: A | Discharge status: Home / Self Care | Payer: Medicare Other | Source: Ambulatory Visit | Attending: Orthopedic Surgery | Admitting: Orthopedic Surgery

## 2014-02-14 DIAGNOSIS — M5126 Other intervertebral disc displacement, lumbar region: Secondary | ICD-10-CM | POA: Diagnosis not present

## 2014-02-14 DIAGNOSIS — M545 Low back pain: Secondary | ICD-10-CM

## 2014-02-14 DIAGNOSIS — M47816 Spondylosis without myelopathy or radiculopathy, lumbar region: Secondary | ICD-10-CM | POA: Diagnosis not present

## 2014-02-15 DIAGNOSIS — M545 Low back pain: Secondary | ICD-10-CM | POA: Diagnosis not present

## 2014-02-21 DIAGNOSIS — M545 Low back pain: Secondary | ICD-10-CM | POA: Diagnosis not present

## 2014-02-22 ENCOUNTER — Other Ambulatory Visit: Payer: Medicare Other

## 2014-03-06 DIAGNOSIS — I10 Essential (primary) hypertension: Secondary | ICD-10-CM | POA: Diagnosis not present

## 2014-03-06 DIAGNOSIS — Z0000 Encounter for general adult medical examination without abnormal findings: Secondary | ICD-10-CM | POA: Diagnosis not present

## 2014-03-06 DIAGNOSIS — E780 Pure hypercholesterolemia: Secondary | ICD-10-CM | POA: Diagnosis not present

## 2014-03-06 DIAGNOSIS — K219 Gastro-esophageal reflux disease without esophagitis: Secondary | ICD-10-CM | POA: Diagnosis not present

## 2014-03-11 ENCOUNTER — Other Ambulatory Visit: Payer: Self-pay | Admitting: Cardiology

## 2014-03-12 ENCOUNTER — Other Ambulatory Visit: Payer: Self-pay

## 2014-03-12 DIAGNOSIS — M810 Age-related osteoporosis without current pathological fracture: Secondary | ICD-10-CM | POA: Diagnosis not present

## 2014-03-12 DIAGNOSIS — H02401 Unspecified ptosis of right eyelid: Secondary | ICD-10-CM | POA: Diagnosis not present

## 2014-03-12 DIAGNOSIS — M159 Polyosteoarthritis, unspecified: Secondary | ICD-10-CM | POA: Diagnosis not present

## 2014-03-12 DIAGNOSIS — M5432 Sciatica, left side: Secondary | ICD-10-CM | POA: Diagnosis not present

## 2014-03-12 MED ORDER — VALSARTAN 160 MG PO TABS: 160.0000 mg | ORAL_TABLET | Freq: Every day | ORAL | Status: DC

## 2014-03-12 NOTE — Telephone Encounter (Signed)
Rx sent to pharmacy   

## 2014-03-13 DIAGNOSIS — M9903 Segmental and somatic dysfunction of lumbar region: Secondary | ICD-10-CM | POA: Diagnosis not present

## 2014-03-13 DIAGNOSIS — M5116 Intervertebral disc disorders with radiculopathy, lumbar region: Secondary | ICD-10-CM | POA: Diagnosis not present

## 2014-03-14 DIAGNOSIS — M5116 Intervertebral disc disorders with radiculopathy, lumbar region: Secondary | ICD-10-CM | POA: Diagnosis not present

## 2014-03-14 DIAGNOSIS — M9903 Segmental and somatic dysfunction of lumbar region: Secondary | ICD-10-CM | POA: Diagnosis not present

## 2014-03-15 DIAGNOSIS — M9903 Segmental and somatic dysfunction of lumbar region: Secondary | ICD-10-CM | POA: Diagnosis not present

## 2014-03-15 DIAGNOSIS — M5116 Intervertebral disc disorders with radiculopathy, lumbar region: Secondary | ICD-10-CM | POA: Diagnosis not present

## 2014-03-19 DIAGNOSIS — M5116 Intervertebral disc disorders with radiculopathy, lumbar region: Secondary | ICD-10-CM | POA: Diagnosis not present

## 2014-03-19 DIAGNOSIS — M9903 Segmental and somatic dysfunction of lumbar region: Secondary | ICD-10-CM | POA: Diagnosis not present

## 2014-03-20 DIAGNOSIS — M5116 Intervertebral disc disorders with radiculopathy, lumbar region: Secondary | ICD-10-CM | POA: Diagnosis not present

## 2014-03-20 DIAGNOSIS — N183 Chronic kidney disease, stage 3 (moderate): Secondary | ICD-10-CM | POA: Diagnosis not present

## 2014-03-20 DIAGNOSIS — M9903 Segmental and somatic dysfunction of lumbar region: Secondary | ICD-10-CM | POA: Diagnosis not present

## 2014-03-21 DIAGNOSIS — M5116 Intervertebral disc disorders with radiculopathy, lumbar region: Secondary | ICD-10-CM | POA: Diagnosis not present

## 2014-03-21 DIAGNOSIS — M9903 Segmental and somatic dysfunction of lumbar region: Secondary | ICD-10-CM | POA: Diagnosis not present

## 2014-03-22 DIAGNOSIS — M9903 Segmental and somatic dysfunction of lumbar region: Secondary | ICD-10-CM | POA: Diagnosis not present

## 2014-03-22 DIAGNOSIS — M5116 Intervertebral disc disorders with radiculopathy, lumbar region: Secondary | ICD-10-CM | POA: Diagnosis not present

## 2014-03-28 DIAGNOSIS — M5116 Intervertebral disc disorders with radiculopathy, lumbar region: Secondary | ICD-10-CM | POA: Diagnosis not present

## 2014-03-28 DIAGNOSIS — M9903 Segmental and somatic dysfunction of lumbar region: Secondary | ICD-10-CM | POA: Diagnosis not present

## 2014-04-02 DIAGNOSIS — M9903 Segmental and somatic dysfunction of lumbar region: Secondary | ICD-10-CM | POA: Diagnosis not present

## 2014-04-02 DIAGNOSIS — M5116 Intervertebral disc disorders with radiculopathy, lumbar region: Secondary | ICD-10-CM | POA: Diagnosis not present

## 2014-04-04 DIAGNOSIS — M9903 Segmental and somatic dysfunction of lumbar region: Secondary | ICD-10-CM | POA: Diagnosis not present

## 2014-04-04 DIAGNOSIS — M5116 Intervertebral disc disorders with radiculopathy, lumbar region: Secondary | ICD-10-CM | POA: Diagnosis not present

## 2014-04-09 DIAGNOSIS — M9903 Segmental and somatic dysfunction of lumbar region: Secondary | ICD-10-CM | POA: Diagnosis not present

## 2014-04-09 DIAGNOSIS — M5116 Intervertebral disc disorders with radiculopathy, lumbar region: Secondary | ICD-10-CM | POA: Diagnosis not present

## 2014-04-11 DIAGNOSIS — M5116 Intervertebral disc disorders with radiculopathy, lumbar region: Secondary | ICD-10-CM | POA: Diagnosis not present

## 2014-04-11 DIAGNOSIS — M9903 Segmental and somatic dysfunction of lumbar region: Secondary | ICD-10-CM | POA: Diagnosis not present

## 2014-04-11 DIAGNOSIS — M810 Age-related osteoporosis without current pathological fracture: Secondary | ICD-10-CM | POA: Diagnosis not present

## 2014-04-14 ENCOUNTER — Other Ambulatory Visit: Payer: Self-pay | Admitting: Cardiology

## 2014-04-16 DIAGNOSIS — M5116 Intervertebral disc disorders with radiculopathy, lumbar region: Secondary | ICD-10-CM | POA: Diagnosis not present

## 2014-04-16 DIAGNOSIS — M9903 Segmental and somatic dysfunction of lumbar region: Secondary | ICD-10-CM | POA: Diagnosis not present

## 2014-04-17 ENCOUNTER — Telehealth: Payer: Self-pay | Admitting: Internal Medicine

## 2014-04-17 NOTE — Telephone Encounter (Signed)
New message       Request for surgical clearance:  1. What type of surgery is being performed? Back injection    (epideral)  When is this surgery scheduled? Not scheduled 2. Are there any medications that need to be held prior to surgery and how long? prodaxa  3. Name of physician performing surgery?  Dr Ernestina Patches  4. What is your office phone and fax number?

## 2014-04-18 DIAGNOSIS — H35341 Macular cyst, hole, or pseudohole, right eye: Secondary | ICD-10-CM | POA: Diagnosis not present

## 2014-04-18 NOTE — Telephone Encounter (Signed)
Pt has a CHADs score of 2 and no history of TIA/Stroke.  CrCl- 22 mL/min (SCr- 1.7).  Would suggest holding Pradaxa for 4-5 days prior to injection and restart the next day.  Spoke with [name], pt's emergency contact and relayed information.  The injection is not scheduled yet.  Will fax information to Dr. Romona Curls office.

## 2014-04-26 DIAGNOSIS — N183 Chronic kidney disease, stage 3 (moderate): Secondary | ICD-10-CM | POA: Diagnosis not present

## 2014-04-26 DIAGNOSIS — M5430 Sciatica, unspecified side: Secondary | ICD-10-CM | POA: Diagnosis not present

## 2014-04-30 ENCOUNTER — Encounter: Payer: Self-pay | Admitting: Internal Medicine

## 2014-04-30 ENCOUNTER — Ambulatory Visit (INDEPENDENT_AMBULATORY_CARE_PROVIDER_SITE_OTHER): Payer: Medicare Other | Admitting: Internal Medicine

## 2014-04-30 VITALS — BP 122/58 | HR 48 | Ht 60.0 in | Wt 130.8 lb

## 2014-04-30 DIAGNOSIS — I10 Essential (primary) hypertension: Secondary | ICD-10-CM | POA: Diagnosis not present

## 2014-04-30 DIAGNOSIS — I480 Paroxysmal atrial fibrillation: Secondary | ICD-10-CM | POA: Diagnosis not present

## 2014-04-30 DIAGNOSIS — R0602 Shortness of breath: Secondary | ICD-10-CM

## 2014-04-30 MED ORDER — AMLODIPINE BESYLATE 2.5 MG PO TABS: ORAL_TABLET | ORAL | Status: DC

## 2014-04-30 NOTE — Patient Instructions (Signed)
Your physician wants you to follow-up in: 6 months with Truitt Merle, NP and 12 months with Dr Vallery Ridge will receive a reminder letter in the mail two months in advance. If you don't receive a letter, please call our office to schedule the follow-up appointment.   Your physician has recommended you make the following change in your medication:  1) Continue Amlodipine 2.5 mg daily.  May take  twice daily if BP is consistently > 160

## 2014-04-30 NOTE — Progress Notes (Signed)
Electrophysiology Office Note   Date:  04/30/2014   ID:  Melanie Whitehead, DOB 03/30/1924, MRN [id number]  PCP:  Horatio Pel, MD  Primary Electrophysiologist: Thompson Grayer, MD    Chief Complaint  Patient presents with  . Follow-up    PAF     History of Present Illness: Melanie Whitehead is a 81 y.o. female who presents today for electrophysiology evaluation.   She is doing well from a CV standpoint.  She is unaware of afib and her bradycardia is asymptomatic.  Her BP is better controlled.  Her daughter states that her recent creatinine was 1.9 She has had no bleeding with pradaxa.  He primary concern today is with back pain. Today, she denies symptoms of palpitations, chest pain, shortness of breath, orthopnea, PND, lower extremity edema, claudication, dizziness, presyncope, syncope, bleeding, or neurologic sequela. The patient is tolerating medications without difficulties and is otherwise without complaint today.    Past Medical History  Diagnosis Date  . BUNDLE BRANCH BLOCK, LEFT 07/09/2006  . CAROTID BRUIT, LEFT 07/08/2007  . COLONIC POLYPS, HX OF 07/09/2006  . DEGENERATIVE JOINT DISEASE 07/09/2006  . GERD 07/13/2008  . HYPERLIPIDEMIA 07/09/2006  . HYPERTENSION 07/09/2006  . OSTEOPOROSIS 07/09/2006  . PAROXYSMAL ATRIAL FIBRILLATION     a. Dx 09/2009;  b. chronic pradaxa;  c. recurrence 04/2013->amiodarone initiated.  Marland Kitchen PVD (peripheral vascular disease)     a. moderate R and severe L subclavian artery stenosis  . CAD (coronary artery disease)     a. 2011 Cath: minimal, nonobstructive dzs.  Marland Kitchen Hx of colonoscopy   . Left ventricular outflow tract obstruction     a. due to dynamic obstruction with LVH  . Moderate mitral regurgitation     a. 12/2010 Echo: EF 65-70%, mild AI, mod MR, mod-sev dil LA, PASP 64mmHg.  . SCC (squamous cell carcinoma)   . Restless leg syndrome   . Osteopenia   . LVH (left ventricular hypertrophy)   . CHF (congestive heart failure)   . Atrial  fibrillation   . Esophageal reflux   . Labial cyst   . Hx of cardiovascular stress test 2015    Lexiscan Myoview (05/2013):  No ischemia, EF 58%, normal study   Past Surgical History  Procedure Laterality Date  . Two para two      abortus zero  . Tonsillectomy    . 2-d echo  12/2005    revealed normal LV function and size.  No wall motion at around his  moderate mitral annular calcification.  Elevated LVOT gradient of 22 thought secondary to narrow outflow  tract aortic valve appeared to open well  . Low risk cardiolite  12/2005  . Cataract extraction Bilateral   . Glaucoma surgery    . Pars plana vitrectomy w/ repair of macular hole    . Cardioversion N/A 05/05/2013    Procedure: CARDIOVERSION;  Surgeon: [name], MD;  Location: [facility] ENDOSCOPY;  Service: Cardiovascular;  Laterality: N/A;     Current Outpatient Prescriptions  Medication Sig Dispense Refill  . amiodarone (PACERONE) 200 MG tablet Take 0.5 tablets (100 mg total) by mouth every other day. 90 tablet 3  . amLODipine (NORVASC) 2.5 MG tablet Take 2.5 mg by mouth daily.    . Biotin ([employer] BIOTIN HIGH POTENCY) 1000 MCG tablet Take 1,000 mcg by mouth daily.    . Calcium Carb-Cholecalciferol 600-400 MG-UNIT TABS Take 1 tablet by mouth 2 (two) times daily.     . Coenzyme Q10 (  CO Q 10) 100 MG CAPS Take 1 capsule by mouth daily.     . CRESTOR 5 MG tablet TAKE ONE TABLET BY MOUTH ONCE DAILY 90 tablet 1  . denosumab (PROLIA) 60 MG/ML SOLN injection Inject 60 mg into the skin every 6 (six) months. Administer in upper arm, thigh, or abdomen    . furosemide (LASIX) 20 MG tablet Take 1/2 tablet by mouth three times a week (M/W/F)    . Multiple Vitamin (MULTIVITAMIN) tablet Take 1 tablet by mouth daily.      . nitroGLYCERIN (NITRODUR - DOSED IN MG/24 HR) 0.2 mg/hr patch Place 0.2 mg onto the skin daily. Take patch off after 12 hours (ONLY USE 1 PATCH A DAY)    . nitroGLYCERIN (NITROSTAT) 0.4 MG SL tablet Place 1 tablet (0.4 mg total)  under the tongue every 5 (five) minutes as needed. 25 tablet 3  . PRADAXA 75 MG CAPS capsule TAKE ONE CAPSULE BY MOUTH EVERY 12 HOURS 180 capsule 2  . rOPINIRole (REQUIP) 1 MG tablet Take 1 mg by mouth at bedtime.    . valsartan (DIOVAN) 160 MG tablet Take 1 tablet (160 mg total) by mouth daily. 90 tablet 3   No current facility-administered medications for this visit.    Allergies:   Sulfa antibiotics and Sulfamethoxazole   Social History:  The patient  reports that she has never smoked. She has never used smokeless tobacco. She reports that she does not drink alcohol or use illicit drugs.   Family History:  The patient's  family history includes COPD in an other family member; Colon cancer in an other family member; Coronary artery disease in her mother; Diabetes in an other family member; Heart attack in her mother; Lung cancer in an other family member; Stroke in her father.    ROS:  Please see the history of present illness.   All other systems are reviewed and negative.    PHYSICAL EXAM: VS:  BP 122/58 mmHg  Pulse 48  Ht 5' (1.524 m)  Wt 130 lb 12.8 oz (59.33 kg)  BMI 25.54 kg/m2 , BMI Body mass index is 25.54 kg/(m^2). GEN: Well nourished, well developed, in no acute distress HEENT: normal Neck: no JVD, carotid bruits, or masses Cardiac: RRR; 2/6 SEM at the apex,no edema  Respiratory:  clear to auscultation bilaterally, normal work of breathing GI: soft, nontender, nondistended, + BS MS: no deformity or atrophy Skin: warm and dry  Neuro:  Strength and sensation are intact Psych: euthymic mood, full affect  EKG:  EKG is ordered today. The ekg ordered today shows sinus bradycardia 48 bpm, LBBB  Recent Labs: 10/18/2013: ALT 24; BUN 26*; Creatinine 1.7*; Hemoglobin 13.0; Platelets 155.0; Potassium 4.8; Sodium 138; TSH 4.35    Lipid Panel     Component Value Date/Time   CHOL 152 06/24/2011 0853   TRIG 155.0* 06/24/2011 0853   HDL 42.50 06/24/2011 0853   CHOLHDL 4  06/24/2011 0853   VLDL 31.0 06/24/2011 0853   LDLCALC 79 06/24/2011 0853   LDLDIRECT 101.7 07/15/2009 0955     Wt Readings from Last 3 Encounters:  04/30/14 130 lb 12.8 oz (59.33 kg)  01/31/14 134 lb (60.782 kg)  01/24/14 135 lb 12.8 oz (61.598 kg)      Other studies Reviewed: Additional studies/ records that were reviewed today include: Eulis Foster notes    ASSESSMENT AND PLAN:  1. afib Maintaining sinus rhythm on amiodarone 100mg  QOD PCP following CrCl on pradaxa 75mg  BID.  Pt is aware that we may have to switch her to coumadin if her renal function declines further Would hold pradaxa x 5 days prior to any epidural injections  2. LV hypertrophy with hyperdynamic LV Maintain preload, avoid dehydration No changes today  3. HL Stable No change required today  4. SOB improved  5. Hypertensive Stable No change required today  6. Venous insufficiency Support hose may be beneficial  7. Sinus bradycardia Asymptomatic No indication for pacing at this time  Return to see Cecille Rubin in 6 months I will see again in 12 months  Current medicines are reviewed at length with the patient today.   The patient does not have concerns regarding her medicines.  The following changes were made today:  none   Signed, Thompson Grayer, MD  04/30/2014 11:03 AM     [facility] [address] [place]  [address] [phone] (office) [phone] (fax)

## 2014-05-02 ENCOUNTER — Encounter: Payer: Self-pay | Admitting: Internal Medicine

## 2014-05-02 DIAGNOSIS — M4316 Spondylolisthesis, lumbar region: Secondary | ICD-10-CM | POA: Diagnosis not present

## 2014-05-02 DIAGNOSIS — M4726 Other spondylosis with radiculopathy, lumbar region: Secondary | ICD-10-CM | POA: Diagnosis not present

## 2014-05-02 DIAGNOSIS — M5416 Radiculopathy, lumbar region: Secondary | ICD-10-CM | POA: Diagnosis not present

## 2014-06-20 DIAGNOSIS — R7989 Other specified abnormal findings of blood chemistry: Secondary | ICD-10-CM | POA: Diagnosis not present

## 2014-06-20 DIAGNOSIS — D631 Anemia in chronic kidney disease: Secondary | ICD-10-CM | POA: Diagnosis not present

## 2014-06-20 DIAGNOSIS — N2581 Secondary hyperparathyroidism of renal origin: Secondary | ICD-10-CM | POA: Diagnosis not present

## 2014-06-20 DIAGNOSIS — I129 Hypertensive chronic kidney disease with stage 1 through stage 4 chronic kidney disease, or unspecified chronic kidney disease: Secondary | ICD-10-CM | POA: Diagnosis not present

## 2014-06-20 DIAGNOSIS — N189 Chronic kidney disease, unspecified: Secondary | ICD-10-CM | POA: Diagnosis not present

## 2014-06-25 ENCOUNTER — Other Ambulatory Visit: Payer: Self-pay | Admitting: Nephrology

## 2014-06-25 DIAGNOSIS — N183 Chronic kidney disease, stage 3 (moderate): Secondary | ICD-10-CM

## 2014-06-28 ENCOUNTER — Ambulatory Visit
Admission: RE | Admit: 2014-06-28 | Discharge: 2014-06-28 | Disposition: A | Discharge status: Home / Self Care | Payer: Medicare Other | Source: Ambulatory Visit | Attending: Nephrology | Admitting: Nephrology

## 2014-06-28 DIAGNOSIS — N183 Chronic kidney disease, stage 3 (moderate): Secondary | ICD-10-CM

## 2014-06-28 DIAGNOSIS — N189 Chronic kidney disease, unspecified: Secondary | ICD-10-CM | POA: Diagnosis not present

## 2014-07-07 ENCOUNTER — Other Ambulatory Visit: Payer: Self-pay | Admitting: Internal Medicine

## 2014-07-10 NOTE — Telephone Encounter (Signed)
Per note 3.21.16

## 2014-07-25 DIAGNOSIS — D631 Anemia in chronic kidney disease: Secondary | ICD-10-CM | POA: Diagnosis not present

## 2014-07-25 DIAGNOSIS — N2581 Secondary hyperparathyroidism of renal origin: Secondary | ICD-10-CM | POA: Diagnosis not present

## 2014-07-25 DIAGNOSIS — I129 Hypertensive chronic kidney disease with stage 1 through stage 4 chronic kidney disease, or unspecified chronic kidney disease: Secondary | ICD-10-CM | POA: Diagnosis not present

## 2014-07-25 DIAGNOSIS — N189 Chronic kidney disease, unspecified: Secondary | ICD-10-CM | POA: Diagnosis not present

## 2014-08-27 ENCOUNTER — Other Ambulatory Visit: Payer: Self-pay | Admitting: *Deleted

## 2014-08-27 ENCOUNTER — Other Ambulatory Visit: Payer: Self-pay | Admitting: Internal Medicine

## 2014-08-27 MED ORDER — FUROSEMIDE 20 MG PO TABS: 10.0000 mg | ORAL_TABLET | Freq: Every day | ORAL | Status: DC

## 2014-08-28 ENCOUNTER — Other Ambulatory Visit: Payer: Self-pay | Admitting: *Deleted

## 2014-08-28 ENCOUNTER — Telehealth: Payer: Self-pay | Admitting: *Deleted

## 2014-08-28 MED ORDER — FUROSEMIDE 20 MG PO TABS: ORAL_TABLET | ORAL | Status: DC

## 2014-08-28 NOTE — Telephone Encounter (Signed)
[facility] called to clarify rx for patients furosemide as it had two different sigs. Should it still be 10mg  three times weekly? Thanks, MI

## 2014-08-28 NOTE — Telephone Encounter (Signed)
Spoke with patient and she stated that she takes furosemide, one half of a 20mg  tablet three times a week. New rx sent in.

## 2014-08-28 NOTE — Telephone Encounter (Signed)
Please call the patient and verify dosage.  This is the best way to see how they are taking. She is very good with her medications

## 2014-09-02 ENCOUNTER — Other Ambulatory Visit: Payer: Self-pay | Admitting: Nurse Practitioner

## 2014-09-03 ENCOUNTER — Other Ambulatory Visit: Payer: Self-pay

## 2014-09-03 DIAGNOSIS — I4891 Unspecified atrial fibrillation: Secondary | ICD-10-CM

## 2014-09-03 MED ORDER — AMIODARONE HCL 200 MG PO TABS: 100.0000 mg | ORAL_TABLET | ORAL | Status: DC

## 2014-10-05 ENCOUNTER — Other Ambulatory Visit: Payer: Self-pay | Admitting: Internal Medicine

## 2014-10-17 DIAGNOSIS — M810 Age-related osteoporosis without current pathological fracture: Secondary | ICD-10-CM | POA: Diagnosis not present

## 2014-10-17 DIAGNOSIS — H35341 Macular cyst, hole, or pseudohole, right eye: Secondary | ICD-10-CM | POA: Diagnosis not present

## 2014-10-31 ENCOUNTER — Telehealth: Payer: Self-pay | Admitting: *Deleted

## 2014-10-31 ENCOUNTER — Ambulatory Visit (INDEPENDENT_AMBULATORY_CARE_PROVIDER_SITE_OTHER): Payer: Medicare Other | Admitting: Nurse Practitioner

## 2014-10-31 ENCOUNTER — Encounter: Payer: Self-pay | Admitting: Nurse Practitioner

## 2014-10-31 VITALS — BP 170/68 | HR 53 | Ht 65.0 in | Wt 131.8 lb

## 2014-10-31 DIAGNOSIS — Z79899 Other long term (current) drug therapy: Secondary | ICD-10-CM

## 2014-10-31 DIAGNOSIS — Z7901 Long term (current) use of anticoagulants: Secondary | ICD-10-CM

## 2014-10-31 DIAGNOSIS — I4892 Unspecified atrial flutter: Secondary | ICD-10-CM

## 2014-10-31 LAB — BASIC METABOLIC PANEL
BUN: 29 mg/dL — ABNORMAL HIGH (ref 6–23)
CO2: 32 mEq/L (ref 19–32)
Calcium: 10.1 mg/dL (ref 8.4–10.5)
Chloride: 104 mEq/L (ref 96–112)
Creatinine, Ser: 1.57 mg/dL — ABNORMAL HIGH (ref 0.40–1.20)
GFR: 33.02 mL/min — ABNORMAL LOW (ref >60.00)
Glucose, Bld: 85 mg/dL (ref 70–99)
Potassium: 4.6 mEq/L (ref 3.5–5.1)
Sodium: 140 mEq/L (ref 135–145)

## 2014-10-31 LAB — CBC
HCT: 39.5 % (ref 36.0–46.0)
Hemoglobin: 13.1 g/dL (ref 12.0–15.0)
MCHC: 33.2 g/dL (ref 30.0–36.0)
MCV: 90.2 fl (ref 78.0–100.0)
Platelets: 138 10*3/uL — ABNORMAL LOW (ref 150.0–400.0)
RBC: 4.38 Mil/uL (ref 3.87–5.11)
RDW: 14.3 % (ref 11.5–15.5)
WBC: 5.7 10*3/uL (ref 4.0–10.5)

## 2014-10-31 LAB — HEPATIC FUNCTION PANEL
ALT: 15 U/L (ref 0–35)
AST: 19 U/L (ref 0–37)
Albumin: 4.1 g/dL (ref 3.5–5.2)
Alkaline Phosphatase: 59 U/L (ref 39–117)
Bilirubin, Direct: 0.1 mg/dL (ref 0.0–0.3)
Total Bilirubin: 0.6 mg/dL (ref 0.2–1.2)
Total Protein: 6.8 g/dL (ref 6.0–8.3)

## 2014-10-31 LAB — TSH: TSH: 3.36 u[IU]/mL (ref 0.35–4.50)

## 2014-10-31 MED ORDER — NITROGLYCERIN 0.4 MG SL SUBL: 0.4000 mg | SUBLINGUAL_TABLET | SUBLINGUAL | Status: DC | PRN

## 2014-10-31 NOTE — Patient Instructions (Addendum)
We will be checking the following labs today - BMET, CBC, HPF and TSh  Medication Instructions:    Continue with your current medicines.     Testing/Procedures To Be Arranged:  N/A  Follow-Up:   See Dr. Rayann Heman in March    Other Special Instructions:   We will request your labs/notes from Dr. Jason Nest office to review.   Call the [place] office at [phone] if you have any questions, problems or concerns.

## 2014-10-31 NOTE — Progress Notes (Signed)
CARDIOLOGY OFFICE NOTE  Date:  10/31/2014    Melanie Whitehead Date of Birth: May 22, 1924 Medical Record #[id number]  PCP:  Horatio Pel, MD  Cardiologist:  Allred    Chief Complaint  Patient presents with  . Atrial Fibrillation    6 month follow up - seen for Dr. Rayann Heman    History of Present Illness: Melanie Whitehead is a 81 y.o. female who presents today for a follow up visit. Seen for Dr. Rayann Heman. She has a history of paroxysmal atrial fibrillation, bilateral subclavian artery stenosis, mitral regurgitation, HTN, HL, GERD, and PVD.   She was seen in March of 2014 after a recent admission to the hospital with recurrent atrial fibrillation. This was complicated by acute on chronic diastolic CHF. She was placed on amiodarone. She underwent cardioversion. She was seen by Dr. Rayann Heman 05/10/13. She remained in sinus rhythm. Her ECG demonstrated 1:1 versus 2:1 AV conduction. Therefore, diltiazem was stopped and her amiodarone dose was decreased.   Seen by Richardson Dopp, PA back in April of 2014 - seemed to be doing ok but with some atypical CP - Myoview updated and this turned out ok.   She has had her dose of amiodarone cut back due to bradycardia. She has had a holter. Has seen Dr. Rayann Heman back in follow up. Trying to continue with her current regimen and avoid PPM if possible.   Comes in today. Here alone. Doing well and she is happy with how she is doing. No chest pain. Breathing is stable. No real palpitations unless she really rushes. Tolerating her medicines. Did end up seeing [facility] this past summer - was told she was ok and did not have to follow back up. She has no real awareness of any recurrent AF. No falls. Tolerating her Pradaxa. BP and HR diary from home reviewed and is stable.   Past Medical History  Diagnosis Date  . BUNDLE BRANCH BLOCK, LEFT 07/09/2006  . CAROTID BRUIT, LEFT 07/08/2007  . COLONIC POLYPS, HX OF 07/09/2006  . DEGENERATIVE JOINT DISEASE 07/09/2006  . GERD  07/13/2008  . HYPERLIPIDEMIA 07/09/2006  . HYPERTENSION 07/09/2006  . OSTEOPOROSIS 07/09/2006  . PAROXYSMAL ATRIAL FIBRILLATION     a. Dx 09/2009;  b. chronic pradaxa;  c. recurrence 04/2013->amiodarone initiated.  Marland Kitchen PVD (peripheral vascular disease)     a. moderate R and severe L subclavian artery stenosis  . CAD (coronary artery disease)     a. 2011 Cath: minimal, nonobstructive dzs.  Marland Kitchen Hx of colonoscopy   . Left ventricular outflow tract obstruction     a. due to dynamic obstruction with LVH  . Moderate mitral regurgitation     a. 12/2010 Echo: EF 65-70%, mild AI, mod MR, mod-sev dil LA, PASP 45mmHg.  . SCC (squamous cell carcinoma)   . Restless leg syndrome   . Osteopenia   . LVH (left ventricular hypertrophy)   . CHF (congestive heart failure)   . Atrial fibrillation   . Esophageal reflux   . Labial cyst   . Hx of cardiovascular stress test 2015    Lexiscan Myoview (05/2013):  No ischemia, EF 58%, normal study    Past Surgical History  Procedure Laterality Date  . Two para two      abortus zero  . Tonsillectomy    . 2-d echo  12/2005    revealed normal LV function and size.  No wall motion at around his  moderate mitral annular calcification.  Elevated LVOT gradient  of 22 thought secondary to narrow outflow  tract aortic valve appeared to open well  . Low risk cardiolite  12/2005  . Cataract extraction Bilateral   . Glaucoma surgery    . Pars plana vitrectomy w/ repair of macular hole    . Cardioversion N/A 05/05/2013    Procedure: CARDIOVERSION;  Surgeon: [name], MD;  Location: [facility] ENDOSCOPY;  Service: Cardiovascular;  Laterality: N/A;     Medications: Current Outpatient Prescriptions  Medication Sig Dispense Refill  . amiodarone (PACERONE) 200 MG tablet Take 0.5 tablets (100 mg total) by mouth every other day. 90 tablet 3  . amLODipine (NORVASC) 2.5 MG tablet Take one by mouth daily, if BP consistently > 160 may take 2 135 tablet 3  . Biotin ([employer] BIOTIN HIGH  POTENCY) 1000 MCG tablet Take 1,000 mcg by mouth daily.    . Calcium Carb-Cholecalciferol 600-400 MG-UNIT TABS Take 1 tablet by mouth 2 (two) times daily.     . Coenzyme Q10 (CO Q 10) 100 MG CAPS Take 1 capsule by mouth daily.     . CRESTOR 5 MG tablet TAKE ONE TABLET BY MOUTH ONCE DAILY 90 tablet 1  . denosumab (PROLIA) 60 MG/ML SOLN injection Inject 60 mg into the skin every 6 (six) months. Administer in upper arm, thigh, or abdomen    . furosemide (LASIX) 20 MG tablet Take 1/2 tablet by mouth three times a week (M/W/F) 30 tablet 5  . Multiple Vitamin (MULTIVITAMIN) tablet Take 1 tablet by mouth daily.      . nitroGLYCERIN (NITRODUR - DOSED IN MG/24 HR) 0.2 mg/hr patch Place 0.2 mg onto the skin daily. Take patch off after 12 hours (ONLY USE 1 PATCH A DAY)    . nitroGLYCERIN (NITROSTAT) 0.4 MG SL tablet Place 1 tablet (0.4 mg total) under the tongue every 5 (five) minutes as needed. 25 tablet 3  . PRADAXA 75 MG CAPS capsule TAKE ONE CAPSULE BY MOUTH EVERY 12 HOURS 180 capsule 0  . rOPINIRole (REQUIP) 1 MG tablet Take 1 mg by mouth at bedtime.    . valsartan (DIOVAN) 160 MG tablet Take 1 tablet (160 mg total) by mouth daily. 90 tablet 3   No current facility-administered medications for this visit.    Allergies: Allergies  Allergen Reactions  . Sulfa Antibiotics Nausea And Vomiting  . Sulfamethoxazole     REACTION: unspecified UNKNOWN    Social History: The patient  reports that she has never smoked. She has never used smokeless tobacco. She reports that she does not drink alcohol or use illicit drugs.   Family History: The patient's family history includes COPD in an other family member; Colon cancer in an other family member; Coronary artery disease in her mother; Diabetes in an other family member; Heart attack in her mother; Lung cancer in an other family member; Stroke in her father.   Review of Systems: Please see the history of present illness.   Otherwise, the review of  systems is positive for DOE, back pain and chest pressure.   All other systems are reviewed and negative.   Physical Exam: VS:  BP 170/68 mmHg  Pulse 53  Ht 5\' 5"  (1.651 m)  Wt 131 lb 12.8 oz (59.784 kg)  BMI 21.93 kg/m2 .  BMI Body mass index is 21.93 kg/(m^2).  Wt Readings from Last 3 Encounters:  10/31/14 131 lb 12.8 oz (59.784 kg)  04/30/14 130 lb 12.8 oz (59.33 kg)  01/31/14 134 lb (60.782 kg)  General: Pleasant. Well developed, well nourished and in no acute distress.  HEENT: Normal. Neck: Supple, no JVD, carotid bruits, or masses noted.  Cardiac: Regular rate and rhythm. Outlfow murmur noted. No edema.  Respiratory:  Lungs are clear to auscultation bilaterally with normal work of breathing.  GI: Soft and nontender.  MS: No deformity or atrophy. Gait and ROM intact. Skin: Warm and dry. Color is normal.  Neuro:  Strength and sensation are intact and no gross focal deficits noted.  Psych: Alert, appropriate and with normal affect.   LABORATORY DATA:  EKG:  EKG is ordered today. This demonstrates sinus brady, LBBB - rate is 53.  Lab Results  Component Value Date   WBC 6.8 10/18/2013   HGB 13.0 10/18/2013   HCT 38.6 10/18/2013   PLT 155.0 10/18/2013   GLUCOSE 80 10/18/2013   CHOL 152 06/24/2011   TRIG 155.0* 06/24/2011   HDL 42.50 06/24/2011   LDLDIRECT 101.7 07/15/2009   LDLCALC 79 06/24/2011   ALT 24 10/18/2013   AST 25 10/18/2013   NA 138 10/18/2013   K 4.8 10/18/2013   CL 106 10/18/2013   CREATININE 1.7* 10/18/2013   BUN 26* 10/18/2013   CO2 26 10/18/2013   TSH 4.35 10/18/2013   INR 1.3* 05/02/2013    BNP (last 3 results) No results for input(s): BNP in the last 8760 hours.  ProBNP (last 3 results) No results for input(s): PROBNP in the last 8760 hours.   Other Studies Reviewed Today:  Myoview Impression from April 2015 Exercise Capacity: [place] with no exercise. BP Response: Normal blood pressure response. Clinical Symptoms: No significant  symptoms noted. ECG Impression: No significant ST segment change suggestive of  ischemia. Comparison with Prior Nuclear Study: No significant change from  previous study  Overall Impression: Normal stress nuclear study.  LV Wall Motion: NL LV Function; NL Wall Motion   Lorretta Harp, MD  Echo Study Conclusions from 05/2013  - Left ventricle: Wall thickness was increased in a pattern of moderate LVH. Systolic function was mildly reduced. The estimated ejection fraction was in the range of 45% to 50%. Wall motion was normal; there were no regional wall motion abnormalities. - Aortic valve: Mild regurgitation. - Mitral valve: Mildly to moderately calcified annulus. Moderate regurgitation. - Left atrium: The atrium was mildly dilated. - Tricuspid valve: Moderate regurgitation. - Pulmonary arteries: PA peak pressure: 99mm Hg (S). - Pericardium, extracardiac: A trivial pericardial effusion was identified  Assessment / Plan: 1. PAF - Maintaining sinus rhythm on low dose amiodarone at 100mg  QOD. CHADSVASC is 6 with a 10% stroke risk annually. She is doing ok clinically.   2. Chronic anticoagulation with Pradaxa -  Pt is aware that we may have to switch her to coumadin if her renal function declines further. She needs labs today. Will also get her records from Dr. Justin Mend to review.  Would hold pradaxa x 5 days prior to any epidural injections  2. LV hypertrophy with hyperdynamic LV Maintain preload, avoid dehydration No changes today  3. HLD on statin therapy. Lipids by PCP  4. Bradycardia - asymptomatic with no indication for pacing at this time. Her rate at home is primarily 50 to 60's - very rare readings in the high 40's noted.   Current medicines are reviewed with the patient today.  The patient does not have concerns regarding medicines other than what has been noted above.  The following changes have been made:  See above.  Labs/ tests ordered  today include:      Orders Placed This Encounter  Procedures  . Basic metabolic panel  . CBC  . Hepatic function panel  . TSH  . EKG 12-Lead     Disposition:   FU with Dr. Rayann Heman as planned in March  Patient is agreeable to this plan and will call if any problems develop in the interim.   Signed: Burtis Junes, RN, ANP-C 10/31/2014 11:12 AM  [facility] [address] [place], [place]  [address] Phone: [phone] Fax: [phone]

## 2014-10-31 NOTE — Telephone Encounter (Signed)
S/w Erline Levine at Dr. Jason Nest office @ [phone] will be faxing lab results per Tera Helper.

## 2014-11-14 DIAGNOSIS — L821 Other seborrheic keratosis: Secondary | ICD-10-CM | POA: Diagnosis not present

## 2014-11-14 DIAGNOSIS — Z85828 Personal history of other malignant neoplasm of skin: Secondary | ICD-10-CM | POA: Diagnosis not present

## 2014-11-17 ENCOUNTER — Other Ambulatory Visit: Payer: Self-pay | Admitting: Internal Medicine

## 2014-11-28 DIAGNOSIS — N644 Mastodynia: Secondary | ICD-10-CM | POA: Diagnosis not present

## 2014-12-11 ENCOUNTER — Telehealth: Payer: Self-pay | Admitting: Internal Medicine

## 2014-12-11 NOTE — Telephone Encounter (Signed)
Generic is okay.  The patient is going to save $1,000 if she switches plans but will have to use generic medications when possible

## 2014-12-11 NOTE — Telephone Encounter (Signed)
New message     Pt states that she may have to change ins for 2017.  If that is the case, her new ins will require her to use the generic crestor.  When the time come, will it be ok to use generic?  She is wanting to know before she signs up with the new ins

## 2014-12-27 ENCOUNTER — Other Ambulatory Visit: Payer: Self-pay

## 2014-12-27 MED ORDER — ROSUVASTATIN CALCIUM 5 MG PO TABS: 5.0000 mg | ORAL_TABLET | Freq: Every day | ORAL | Status: DC

## 2015-03-06 ENCOUNTER — Other Ambulatory Visit: Payer: Self-pay | Admitting: *Deleted

## 2015-03-06 ENCOUNTER — Telehealth: Payer: Self-pay | Admitting: Internal Medicine

## 2015-03-06 DIAGNOSIS — I4891 Unspecified atrial fibrillation: Secondary | ICD-10-CM

## 2015-03-06 MED ORDER — VALSARTAN 160 MG PO TABS: 160.0000 mg | ORAL_TABLET | Freq: Every day | ORAL | Status: DC

## 2015-03-06 MED ORDER — DABIGATRAN ETEXILATE MESYLATE 75 MG PO CAPS: ORAL_CAPSULE | ORAL | Status: DC

## 2015-03-06 MED ORDER — AMIODARONE HCL 200 MG PO TABS: 100.0000 mg | ORAL_TABLET | ORAL | Status: DC

## 2015-03-06 MED ORDER — FUROSEMIDE 20 MG PO TABS: ORAL_TABLET | ORAL | Status: DC

## 2015-03-06 NOTE — Telephone Encounter (Signed)
NewMessage  Patient calling the office for samples of medication:   1.  What medication and dosage are you requesting samples for? Predaxa 75 mg  2.  Are you currently out of this medication? 3 left

## 2015-03-06 NOTE — Telephone Encounter (Signed)
Patient aware samples will be at the front desk.

## 2015-03-14 DIAGNOSIS — Z0000 Encounter for general adult medical examination without abnormal findings: Secondary | ICD-10-CM | POA: Diagnosis not present

## 2015-03-14 DIAGNOSIS — N183 Chronic kidney disease, stage 3 (moderate): Secondary | ICD-10-CM | POA: Diagnosis not present

## 2015-03-14 DIAGNOSIS — I10 Essential (primary) hypertension: Secondary | ICD-10-CM | POA: Diagnosis not present

## 2015-03-14 DIAGNOSIS — E7800 Pure hypercholesterolemia, unspecified: Secondary | ICD-10-CM | POA: Diagnosis not present

## 2015-03-20 DIAGNOSIS — R0789 Other chest pain: Secondary | ICD-10-CM | POA: Diagnosis not present

## 2015-03-20 DIAGNOSIS — Z1212 Encounter for screening for malignant neoplasm of rectum: Secondary | ICD-10-CM | POA: Diagnosis not present

## 2015-03-20 DIAGNOSIS — I10 Essential (primary) hypertension: Secondary | ICD-10-CM | POA: Diagnosis not present

## 2015-03-20 DIAGNOSIS — I4891 Unspecified atrial fibrillation: Secondary | ICD-10-CM | POA: Diagnosis not present

## 2015-03-20 DIAGNOSIS — I25118 Atherosclerotic heart disease of native coronary artery with other forms of angina pectoris: Secondary | ICD-10-CM | POA: Diagnosis not present

## 2015-03-20 DIAGNOSIS — N183 Chronic kidney disease, stage 3 (moderate): Secondary | ICD-10-CM | POA: Diagnosis not present

## 2015-03-22 ENCOUNTER — Telehealth: Payer: Self-pay | Admitting: Internal Medicine

## 2015-03-22 NOTE — Telephone Encounter (Signed)
New Message  Pt daughter called states that the pt heart rate is in the 90's and at times it is irregular. Req a call back to determine if the pt will need to be seen before Monday to discuss medication changes. Please call back to discuss how to move forward.

## 2015-03-22 NOTE — Telephone Encounter (Signed)
Spoke with daughter, patient is feeling fine.  No different than when she was last seen.  Just saw her PCP and said her HR was higher tahn before.  I let the daughter know we would do an EKG on Mon and that if PA needed to discuss with Dr Rayann Heman he will be if the office.

## 2015-03-25 ENCOUNTER — Ambulatory Visit (INDEPENDENT_AMBULATORY_CARE_PROVIDER_SITE_OTHER): Payer: Medicare Other | Admitting: Physician Assistant

## 2015-03-25 ENCOUNTER — Encounter: Payer: Self-pay | Admitting: Physician Assistant

## 2015-03-25 ENCOUNTER — Encounter: Payer: Self-pay | Admitting: Internal Medicine

## 2015-03-25 VITALS — BP 130/50 | HR 56 | Ht 59.0 in | Wt 131.8 lb

## 2015-03-25 DIAGNOSIS — I480 Paroxysmal atrial fibrillation: Secondary | ICD-10-CM

## 2015-03-25 DIAGNOSIS — I4891 Unspecified atrial fibrillation: Secondary | ICD-10-CM

## 2015-03-25 DIAGNOSIS — I10 Essential (primary) hypertension: Secondary | ICD-10-CM | POA: Diagnosis not present

## 2015-03-25 DIAGNOSIS — Q248 Other specified congenital malformations of heart: Secondary | ICD-10-CM

## 2015-03-25 MED ORDER — AMIODARONE HCL 200 MG PO TABS: ORAL_TABLET | ORAL | Status: DC

## 2015-03-25 NOTE — Assessment & Plan Note (Signed)
Patient is back in atrial fibrillation and is having some fast rates. She has not tolerated it well in the past. She is anticoagulated with Pradaxa. She did miss one dose 2 weeks ago today. I discussed this patient in detail with Dr. Rayann Heman who knows her well. He recommends increasing amiodarone to 200 mg twice a day for 5 days then 200 mg daily. She will then come back in to be set up for cardioversion if she doesn't convert on her own. He doesn't think she'll tolerate atrial fib very long. They've also continue to check her at the nursing home for bradycardia.

## 2015-03-25 NOTE — Assessment & Plan Note (Signed)
Blood pressure stable ? ?

## 2015-03-25 NOTE — Patient Instructions (Addendum)
Medication Instructions:  Your physician has recommended you make the following change in your medication: INCREASE Amiodarone to 200mg  twice daily for 5 days. Then reduce to 200mg  daily   Labwork: None ordered  Testing/Procedures: None ordered  Follow-Up: Your physician recommends that you schedule a follow-up appointment on 04/01/15 with Kathrene Alu   Any Other Special Instructions Will Be Listed Below (If Applicable).     If you need a refill on your cardiac medications before your next appointment, please call your pharmacy.

## 2015-03-25 NOTE — Progress Notes (Signed)
Cardiology Office Note   Date:  03/25/2015   ID:  Melanie Whitehead, DOB November 16, 1924, MRN [id number]  PCP:  Horatio Pel, MD  Cardiologist:  Dr. Rayann Heman  Chief Complaint:irregular heartbeat    History of Present Illness: Melanie Whitehead is a 82 y.o. female who presents for follow up.She has a history of paroxysmal atrial fibrillation on amiodarone 100 mg qod because of bradycardia. She has maintained sinus rhythm. She was on Pradaxa as well. She also has LV hypertrophy with hyperdynamic LV. She recently moved to a nursing home and they noticed her heart rates have been high at times in the 90s to 114. So far she has been asymptomatic without chest pain, dyspnea, palpitations, dizziness or presyncope. She did miss one Pradaxa 2 weeks ago.    Past Medical History  Diagnosis Date  . BUNDLE BRANCH BLOCK, LEFT 07/09/2006  . CAROTID BRUIT, LEFT 07/08/2007  . COLONIC POLYPS, HX OF 07/09/2006  . DEGENERATIVE JOINT DISEASE 07/09/2006  . GERD 07/13/2008  . HYPERLIPIDEMIA 07/09/2006  . HYPERTENSION 07/09/2006  . OSTEOPOROSIS 07/09/2006  . PAROXYSMAL ATRIAL FIBRILLATION     a. Dx 09/2009;  b. chronic pradaxa;  c. recurrence 04/2013->amiodarone initiated.  Marland Kitchen PVD (peripheral vascular disease) (HCC)     a. moderate R and severe L subclavian artery stenosis  . CAD (coronary artery disease)     a. 2011 Cath: minimal, nonobstructive dzs.  Marland Kitchen Hx of colonoscopy   . Left ventricular outflow tract obstruction     a. due to dynamic obstruction with LVH  . Moderate mitral regurgitation     a. 12/2010 Echo: EF 65-70%, mild AI, mod MR, mod-sev dil LA, PASP 22mmHg.  . SCC (squamous cell carcinoma)   . Restless leg syndrome   . Osteopenia   . LVH (left ventricular hypertrophy)   . CHF (congestive heart failure) ([facility])   . Atrial fibrillation ([facility])   . Esophageal reflux   . Labial cyst   . Hx of cardiovascular stress test 2015    Lexiscan Myoview (05/2013):  No ischemia, EF 58%, normal study    Past  Surgical History  Procedure Laterality Date  . Two para two      abortus zero  . Tonsillectomy    . 2-d echo  12/2005    revealed normal LV function and size.  No wall motion at around his  moderate mitral annular calcification.  Elevated LVOT gradient of 22 thought secondary to narrow outflow  tract aortic valve appeared to open well  . Low risk cardiolite  12/2005  . Cataract extraction Bilateral   . Glaucoma surgery    . Pars plana vitrectomy w/ repair of macular hole    . Cardioversion N/A 05/05/2013    Procedure: CARDIOVERSION;  Surgeon: [name], MD;  Location: [facility] ENDOSCOPY;  Service: Cardiovascular;  Laterality: N/A;     Current Outpatient Prescriptions  Medication Sig Dispense Refill  . amiodarone (PACERONE) 200 MG tablet Take 0.5 tablets (100 mg total) by mouth every other day. 45 tablet 2  . amLODipine (NORVASC) 2.5 MG tablet Take one by mouth daily, if BP consistently > 160 may take 2 135 tablet 3  . Biotin ([employer] BIOTIN HIGH POTENCY) 1000 MCG tablet Take 1,000 mcg by mouth daily.    . Calcium Carb-Cholecalciferol 600-400 MG-UNIT TABS Take 1 tablet by mouth 2 (two) times daily.     . Coenzyme Q10 (CO Q 10) 100 MG CAPS Take 1 capsule by mouth daily.     Marland Kitchen  dabigatran (PRADAXA) 75 MG CAPS capsule TAKE ONE CAPSULE BY MOUTH EVERY 12 HOURS 180 capsule 2  . denosumab (PROLIA) 60 MG/ML SOLN injection Inject 60 mg into the skin every 6 (six) months. Administer in upper arm, thigh, or abdomen    . furosemide (LASIX) 20 MG tablet Take 1/2 tablet by mouth three times a week (M/W/F) 30 tablet 2  . Multiple Vitamin (MULTIVITAMIN) tablet Take 1 tablet by mouth daily.      . nitroGLYCERIN (NITRODUR - DOSED IN MG/24 HR) 0.2 mg/hr patch Place 0.2 mg onto the skin daily. Take patch off after 12 hours (ONLY USE 1 PATCH A DAY)    . nitroGLYCERIN (NITROSTAT) 0.4 MG SL tablet Place 1 tablet (0.4 mg total) under the tongue every 5 (five) minutes as needed. 25 tablet 3  . rOPINIRole (REQUIP) 1  MG tablet Take 1 mg by mouth at bedtime.    . rosuvastatin (CRESTOR) 5 MG tablet Take 1 tablet (5 mg total) by mouth daily. 90 tablet 3  . valsartan (DIOVAN) 160 MG tablet Take 1 tablet (160 mg total) by mouth daily. 90 tablet 2   No current facility-administered medications for this visit.    Allergies:   Sulfa antibiotics and Sulfamethoxazole    Social History:  The patient  reports that she has never smoked. She has never used smokeless tobacco. She reports that she does not drink alcohol or use illicit drugs.   Family History:  The patient's   family history includes Coronary artery disease in her mother; Heart attack in her mother; Stroke in her father.    ROS:  Please see the history of present illness.   Otherwise, review of systems are positive for none.   All other systems are reviewed and negative.    PHYSICAL EXAM: VS:  BP 130/50 mmHg  Pulse 56  Ht 4\' 11"  (1.499 m)  Wt 131 lb 12.8 oz (59.784 kg)  BMI 26.61 kg/m2  SpO2 98% , BMI Body mass index is 26.61 kg/(m^2). GEN: Well nourished, well developed, in no acute distress Neck: no JVD, HJR, carotid bruits, or masses Cardiac: Irregular irregular with 2/6 systolic murmur at the left sternal border, positive S4, no  rubs, thrill or heave,  Respiratory:  clear to auscultation bilaterally, normal work of breathing GI: soft, nontender, nondistended, + BS MS: no deformity or atrophy Extremities: without cyanosis, clubbing, edema, good distal pulses bilaterally.  Skin: warm and dry, no rash Neuro:  Strength and sensation are intact    EKG:  EKG is ordered today. The ekg ordered today demonstrates atrial fibrillation at 87 bpm with left bundle branch block   Recent Labs: 10/31/2014: ALT 15; BUN 29*; Creatinine, Ser 1.57*; Hemoglobin 13.1; Platelets 138.0*; Potassium 4.6; Sodium 140; TSH 3.36    Lipid Panel    Component Value Date/Time   CHOL 152 06/24/2011 0853   TRIG 155.0* 06/24/2011 0853   HDL 42.50 06/24/2011 0853    CHOLHDL 4 06/24/2011 0853   VLDL 31.0 06/24/2011 0853   LDLCALC 79 06/24/2011 0853   LDLDIRECT 101.7 07/15/2009 0955      Wt Readings from Last 3 Encounters:  03/25/15 131 lb 12.8 oz (59.784 kg)  10/31/14 131 lb 12.8 oz (59.784 kg)  04/30/14 130 lb 12.8 oz (59.33 kg)      Other studies Reviewed: Additional studies/ records that were reviewed today include and review of the records demonstrates:  Myoview Impression from April 2015 Exercise Capacity: [place] with no exercise. BP Response: Normal  blood pressure response. Clinical Symptoms: No significant symptoms noted. ECG Impression: No significant ST segment change suggestive of  ischemia. Comparison with Prior Nuclear Study: No significant change from  previous study  Overall Impression: Normal stress nuclear study.  LV Wall Motion: NL LV Function; NL Wall Motion   Lorretta Harp, MD  Echo Study Conclusions from 05/2013  - Left ventricle: Wall thickness was increased in a pattern   of moderate LVH. Systolic function was mildly reduced. The   estimated ejection fraction was in the range of 45% to   50%. Wall motion was normal; there were no regional wall   motion abnormalities. - Aortic valve: Mild regurgitation. - Mitral valve: Mildly to moderately calcified annulus.   Moderate regurgitation. - Left atrium: The atrium was mildly dilated. - Tricuspid valve: Moderate regurgitation. - Pulmonary arteries: PA peak pressure: 42mm Hg (S). - Pericardium, extracardiac: A trivial pericardial effusion   was identified   ASSESSMENT AND PLAN:  PAROXYSMAL ATRIAL FIBRILLATION Patient is back in atrial fibrillation and is having some fast rates. She has not tolerated it well in the past. She is anticoagulated with Pradaxa. She did miss one dose 2 weeks ago today. I discussed this patient in detail with Dr. Rayann Heman who knows her well. He recommends increasing amiodarone to 200 mg twice a day for 5 days then 200 mg daily. She  will then come back in to be set up for cardioversion if she doesn't convert on her own. He doesn't think she'll tolerate atrial fib very long. They've also continue to check her at the nursing home for bradycardia.  Essential hypertension Blood pressure stable  Left ventricular outflow obstruction Last 2-D echo 2015 EF 45-50% with moderate LVH    Signed, Ermalinda Barrios, PA-C  03/25/2015 11:04 AM    [facility] [place], [place], [place]  [address] Phone: [phone]; Fax: [phone]

## 2015-03-25 NOTE — Assessment & Plan Note (Signed)
Last 2-D echo 2015 EF 45-50% with moderate LVH

## 2015-04-01 ENCOUNTER — Other Ambulatory Visit: Payer: Self-pay | Admitting: Nurse Practitioner

## 2015-04-01 ENCOUNTER — Encounter: Payer: Self-pay | Admitting: Nurse Practitioner

## 2015-04-01 ENCOUNTER — Ambulatory Visit (INDEPENDENT_AMBULATORY_CARE_PROVIDER_SITE_OTHER): Payer: Medicare Other | Admitting: Nurse Practitioner

## 2015-04-01 VITALS — BP 128/70 | HR 96 | Ht 59.0 in | Wt 131.6 lb

## 2015-04-01 DIAGNOSIS — Z7901 Long term (current) use of anticoagulants: Secondary | ICD-10-CM

## 2015-04-01 DIAGNOSIS — I480 Paroxysmal atrial fibrillation: Secondary | ICD-10-CM

## 2015-04-01 DIAGNOSIS — I4891 Unspecified atrial fibrillation: Secondary | ICD-10-CM | POA: Diagnosis not present

## 2015-04-01 LAB — CBC
HCT: 41.3 % (ref 36.0–46.0)
Hemoglobin: 13.8 g/dL (ref 12.0–15.0)
MCH: 30.1 pg (ref 26.0–34.0)
MCHC: 33.4 g/dL (ref 30.0–36.0)
MCV: 90.2 fL (ref 78.0–100.0)
MPV: 11.8 fL (ref 8.6–12.4)
Platelets: 252 10*3/uL (ref 150–400)
RBC: 4.58 MIL/uL (ref 3.87–5.11)
RDW: 14.1 % (ref 11.5–15.5)
WBC: 7.2 10*3/uL (ref 4.0–10.5)

## 2015-04-01 NOTE — Patient Instructions (Addendum)
We will be checking the following labs today - BMET, CBC, PT, PTT and TSH   Medication Instructions:    Continue with your current medicines.     Testing/Procedures To Be Arranged:  Cardioversion  Follow-Up:   See me post cardioversion with repeat EKG (try to schedule on day that Dr. Rayann Heman is here).    Other Special Instructions:  Your provider has recommended a cardioversion.   You are scheduled for a cardioversion on Monday, February 27th at [place] with Dr. Harrington Challenger or associates. Please go to [facility] Stay at Monday, February 27th at 9:30 AM Enter through the [place] not have any food or drink after midnight on Sunday.  You may take your medicines with a sip of water on the day of your procedure EXCEPT DO NOT TAKE YOUR AMIODARONE THAT MORNING  You will need someone to drive you home following your procedure.   Call the [place] office at [phone] if you have any questions, problems or concerns.     Electrical Cardioversion Electrical cardioversion is the delivery of a jolt of electricity to change the rhythm of the heart. Sticky patches or metal paddles are placed on the chest to deliver the electricity from a device. This is done to restore a normal rhythm. A rhythm that is too fast or not regular keeps the heart from pumping well. Electrical cardioversion is done in an emergency if:  There is low or no blood pressure as a result of the heart rhythm.  Normal rhythm must be restored as fast as possible to protect the brain and heart from further damage.  It may save a life. Cardioversion may be done for heart rhythms that are not immediately life threatening, such as atrial fibrillation or flutter, in which:  The heart is beating too fast or is not regular.  Medicine to change the rhythm has not worked.  It is safe to wait in order to allow time for preparation. Symptoms of the abnormal rhythm are  bothersome. The risk of stroke and other serious problems can be reduced.  LET [facility] CARE PROVIDER KNOW ABOUT:  Any allergies you have. All medicines you are taking, including vitamins, herbs, eye drops, creams, and over-the-counter medicines. Previous problems you or members of your family have had with the use of anesthetics.  Any blood disorders you have.  Previous surgeries you have had.  Medical conditions you have.   RISKS AND COMPLICATIONS  Generally, this is a safe procedure. However, problems can occur and include:  Breathing problems related to the anesthetic used. A blood clot that breaks free and travels to other parts of your body. This could cause a stroke or other problems. The risk of this is lowered by use of blood-thinning medicine (anticoagulant) prior to the procedure. Cardiac arrest (rare).   BEFORE THE PROCEDURE  You may have tests to detect blood clots in your heart and to evaluate heart function. You may start taking anticoagulants so your blood does not clot as easily.  Medicines may be given to help stabilize your heart rate and rhythm.   PROCEDURE You will be given medicine through an IV tube to reduce discomfort and make you sleepy (sedative).  An electrical shock will be delivered.   AFTER THE PROCEDURE Your heart rhythm will be watched to make sure it does not change. You will need someone to drive you home.      If you  need a refill on your cardiac medications before your next appointment, please call your pharmacy.   Call the [place] office at [phone] if you have any questions, problems or concerns.

## 2015-04-01 NOTE — Progress Notes (Signed)
CARDIOLOGY OFFICE NOTE  Date:  04/01/2015    Melanie Whitehead Date of Birth: August 31, 1924 Medical Record [id number]  PCP:  Horatio Pel, MD  Cardiologist:  [place]    Chief Complaint  Patient presents with  . Palpitations  . Atrial Fibrillation    Follow up visit - seen for Dr. Rayann Heman.     History of Present Illness: Melanie Whitehead is a 82 y.o. female who presents today for a follow up visit. Seen for Dr. Rayann Heman. She has a history of paroxysmal atrial fibrillation, bilateral subclavian artery stenosis, mitral regurgitation, HTN, HL, GERD, and PVD.   She was seen in March of 2014 after a recent admission to the hospital with recurrent atrial fibrillation. This was complicated by acute on chronic diastolic CHF. She was placed on amiodarone. She underwent cardioversion. She was seen by Dr. Rayann Heman 05/10/13. She remained in sinus rhythm. Her ECG demonstrated 1:1 versus 2:1 AV conduction. Therefore, diltiazem was stopped and her amiodarone dose was decreased.   Seen by Richardson Dopp, PA back in April of 2014 - seemed to be doing ok but with some atypical CP - Myoview updated and this turned out ok.   Since then, she has had her dose of amiodarone cut back due to bradycardia. She has had a holter. Has seen Dr. Rayann Heman back in follow up. Trying to continue with her current regimen and avoid PPM if possible.   I last saw her in September and she was doing well.   Was here earlier this month and saw Ermalinda Barrios, [place] - had moved to SNF. HR noted to be elevated by the staff. She was brought back here and noted to be back in AF. Amiodarone was increased with plans for possible cardioversion. She had missed one dose of Pradaxa - almost 3 weeks ago - one dose only.  It was felt that she would not tolerate long term AF due to prior experience.   Comes in today. Here with her daughter Melanie Whitehead today. She is doing ok. Not really symptomatic - may be a little more short of breath but really  feels ok. Not dizzy or lightheaded. Energy level may be a little less. Almost 3 weeks now from her one missed dose of Pradaxa. No falls. Daughter notes that she seems less symptomatic with this spell.   Past Medical History  Diagnosis Date  . BUNDLE BRANCH BLOCK, LEFT 07/09/2006  . CAROTID BRUIT, LEFT 07/08/2007  . COLONIC POLYPS, HX OF 07/09/2006  . DEGENERATIVE JOINT DISEASE 07/09/2006  . GERD 07/13/2008  . HYPERLIPIDEMIA 07/09/2006  . HYPERTENSION 07/09/2006  . OSTEOPOROSIS 07/09/2006  . PAROXYSMAL ATRIAL FIBRILLATION     a. Dx 09/2009;  b. chronic pradaxa;  c. recurrence 04/2013->amiodarone initiated.  Marland Kitchen PVD (peripheral vascular disease) (HCC)     a. moderate R and severe L subclavian artery stenosis  . CAD (coronary artery disease)     a. 2011 Cath: minimal, nonobstructive dzs.  Marland Kitchen Hx of colonoscopy   . Left ventricular outflow tract obstruction     a. due to dynamic obstruction with LVH  . Moderate mitral regurgitation     a. 12/2010 Echo: EF 65-70%, mild AI, mod MR, mod-sev dil LA, PASP 25mmHg.  . SCC (squamous cell carcinoma)   . Restless leg syndrome   . Osteopenia   . LVH (left ventricular hypertrophy)   . CHF (congestive heart failure) ([place])   . Atrial fibrillation ([place])   . Esophageal reflux   .  Labial cyst   . Hx of cardiovascular stress test 2015    Lexiscan Myoview (05/2013):  No ischemia, EF 58%, normal study    Past Surgical History  Procedure Laterality Date  . Two para two      abortus zero  . Tonsillectomy    . 2-d echo  12/2005    revealed normal LV function and size.  No wall motion at around his  moderate mitral annular calcification.  Elevated LVOT gradient of 22 thought secondary to narrow outflow  tract aortic valve appeared to open well  . Low risk cardiolite  12/2005  . Cataract extraction Bilateral   . Glaucoma surgery    . Pars plana vitrectomy w/ repair of macular hole    . Cardioversion N/A 05/05/2013    Procedure: CARDIOVERSION;  Surgeon: [name], MD;  Location: [facility] ENDOSCOPY;  Service: Cardiovascular;  Laterality: N/A;     Medications: Current Outpatient Prescriptions  Medication Sig Dispense Refill  . amiodarone (PACERONE) 200 MG tablet Take 200mg  twice daily for 5 days. Then take 200mg  daily (Patient taking differently: No sig reported) 45 tablet 2  . amLODipine (NORVASC) 2.5 MG tablet Take one by mouth daily, if BP consistently > 160 may take 2 135 tablet 3  . Biotin ([employer] BIOTIN HIGH POTENCY) 1000 MCG tablet Take 1,000 mcg by mouth daily.    . Calcium Carb-Cholecalciferol 600-400 MG-UNIT TABS Take 1 tablet by mouth 2 (two) times daily.     . Coenzyme Q10 (CO Q 10) 100 MG CAPS Take 1 capsule by mouth daily.     . dabigatran (PRADAXA) 75 MG CAPS capsule TAKE ONE CAPSULE BY MOUTH EVERY 12 HOURS 180 capsule 2  . denosumab (PROLIA) 60 MG/ML SOLN injection Inject 60 mg into the skin every 6 (six) months. Administer in upper arm, thigh, or abdomen    . furosemide (LASIX) 20 MG tablet Take 1/2 tablet by mouth three times a week (M/W/F) 30 tablet 2  . Multiple Vitamin (MULTIVITAMIN) tablet Take 1 tablet by mouth daily.      . nitroGLYCERIN (NITRODUR - DOSED IN MG/24 HR) 0.2 mg/hr patch Place 0.2 mg onto the skin daily. Take patch off after 12 hours (ONLY USE 1 PATCH A DAY)    . nitroGLYCERIN (NITROSTAT) 0.4 MG SL tablet Place 1 tablet (0.4 mg total) under the tongue every 5 (five) minutes as needed. 25 tablet 3  . rOPINIRole (REQUIP) 1 MG tablet Take 1 mg by mouth at bedtime.    . rosuvastatin (CRESTOR) 5 MG tablet Take 1 tablet (5 mg total) by mouth daily. 90 tablet 3  . valsartan (DIOVAN) 160 MG tablet Take 1 tablet (160 mg total) by mouth daily. 90 tablet 2   No current facility-administered medications for this visit.    Allergies: Allergies  Allergen Reactions  . Sulfa Antibiotics Nausea And Vomiting  . Sulfamethoxazole     REACTION: unspecified UNKNOWN    Social History: The patient  reports that she has never  smoked. She has never used smokeless tobacco. She reports that she does not drink alcohol or use illicit drugs.   Family History: The patient's family history includes Coronary artery disease in her mother; Heart attack in her mother; Stroke in her father.   Review of Systems: Please see the history of present illness.   Otherwise, the review of systems is positive for none.   All other systems are reviewed and negative.   Physical Exam: VS:  BP 128/70  mmHg  Pulse 96  Ht 4\' 11"  (1.499 m)  Wt 131 lb 9.6 oz (59.693 kg)  BMI 26.57 kg/m2 .  BMI Body mass index is 26.57 kg/(m^2).  Wt Readings from Last 3 Encounters:  04/01/15 131 lb 9.6 oz (59.693 kg)  03/25/15 131 lb 12.8 oz (59.784 kg)  10/31/14 131 lb 12.8 oz (59.784 kg)    General: Pleasant. Elderly female who is alert and in no acute distress.  HEENT: Normal. Neck: Supple, no JVD, carotid bruits, or masses noted.  Cardiac: Irregular irregular rhythm. Rate is ok. No murmurs, rubs, or gallops. No edema.  Respiratory:  Lungs are clear to auscultation bilaterally with normal work of breathing.  GI: Soft and nontender.  MS: No deformity or atrophy. Gait and ROM intact. Skin: Warm and dry. Color is normal.  Neuro:  Strength and sensation are intact and no gross focal deficits noted.  Psych: Alert, appropriate and with normal affect.   LABORATORY DATA:  EKG:  EKG is ordered today. This demonstrates atrial fib with a LBBB - rate is ok.  Lab Results  Component Value Date   WBC 5.7 10/31/2014   HGB 13.1 10/31/2014   HCT 39.5 10/31/2014   PLT 138.0* 10/31/2014   GLUCOSE 85 10/31/2014   CHOL 152 06/24/2011   TRIG 155.0* 06/24/2011   HDL 42.50 06/24/2011   LDLDIRECT 101.7 07/15/2009   LDLCALC 79 06/24/2011   ALT 15 10/31/2014   AST 19 10/31/2014   NA 140 10/31/2014   K 4.6 10/31/2014   CL 104 10/31/2014   CREATININE 1.57* 10/31/2014   BUN 29* 10/31/2014   CO2 32 10/31/2014   TSH 3.36 10/31/2014   INR 1.3* 05/02/2013     BNP (last 3 results) No results for input(s): BNP in the last 8760 hours.  ProBNP (last 3 results) No results for input(s): PROBNP in the last 8760 hours.   Other Studies Reviewed Today:  Myoview Impression from April 2015 Exercise Capacity: [place] with no exercise. BP Response: Normal blood pressure response. Clinical Symptoms: No significant symptoms noted. ECG Impression: No significant ST segment change suggestive of  ischemia. Comparison with Prior Nuclear Study: No significant change from  previous study  Overall Impression: Normal stress nuclear study.  LV Wall Motion: NL LV Function; NL Wall Motion   Lorretta Harp, MD  Echo Study Conclusions from 05/2013  - Left ventricle: Wall thickness was increased in a pattern of moderate LVH. Systolic function was mildly reduced. The estimated ejection fraction was in the range of 45% to 50%. Wall motion was normal; there were no regional wall motion abnormalities. - Aortic valve: Mild regurgitation. - Mitral valve: Mildly to moderately calcified annulus. Moderate regurgitation. - Left atrium: The atrium was mildly dilated. - Tricuspid valve: Moderate regurgitation. - Pulmonary arteries: PA peak pressure: 51mm Hg (S). - Pericardium, extracardiac: A trivial pericardial effusion was identified  Assessment / Plan: 1. PAF - she has reverted back to AF from last visit. Her dose of amiodarone was increased. I still worry that bradycardia is a trigger for her. Will arrange for cardioversion next Monday.  CHADSVASC is 6 with a 10% stroke risk annually. She does not appear to be symptomatic with this most recent episode.   2. Chronic anticoagulation with Pradaxa - last creatinine was 1.5. She missed only ONE dose about 3 weeks ago. Cardioversion for next week when this will be 4 weeks after ONE missed dose.   2. LV hypertrophy with hyperdynamic LV - last echo  noted.    4. HLD on statin therapy. Lipids by  PCP  5. Resting Bradycardia - asymptomatic in the past but has had to have her dose of amiodarone cut back. PPM may be indicated going forward or just managing her with rate control and anticoagulation. Discussed with Dr. Rayann Heman - would like to hold on PPM and if needed - will manage with rate control and anticoagulation.   Current medicines are reviewed with the patient today.  The patient does not have concerns regarding medicines other than what has been noted above.  The following changes have been made:  See above.  Labs/ tests ordered today include:    Orders Placed This Encounter  Procedures  . Basic metabolic panel  . CBC  . Protime-INR  . APTT  . TSH  . EKG 12-Lead     Disposition:   FU with me post cardioversion with EKG.   Patient is agreeable to this plan and will call if any problems develop in the interim.   Signed: Burtis Junes, RN, ANP-C 04/01/2015 10:55 AM  [facility] [address] [place], [place]  [address] Phone: [phone] Fax: [phone]

## 2015-04-02 LAB — BASIC METABOLIC PANEL
BUN: 28 mg/dL — ABNORMAL HIGH (ref 7–25)
CO2: 26 mmol/L (ref 20–31)
Calcium: 10.1 mg/dL (ref 8.6–10.4)
Chloride: 102 mmol/L (ref 98–110)
Creat: 1.56 mg/dL — ABNORMAL HIGH (ref 0.60–0.88)
Glucose, Bld: 86 mg/dL (ref 65–99)
Potassium: 5.1 mmol/L (ref 3.5–5.3)
Sodium: 139 mmol/L (ref 135–146)

## 2015-04-02 LAB — APTT: aPTT: 61 seconds — ABNORMAL HIGH (ref 24–37)

## 2015-04-02 LAB — TSH: TSH: 2.69 mIU/L

## 2015-04-02 LAB — PROTIME-INR
INR: 1.62 — ABNORMAL HIGH (ref <1.50)
Prothrombin Time: 19.4 seconds — ABNORMAL HIGH (ref 11.6–15.2)

## 2015-04-08 ENCOUNTER — Encounter (HOSPITAL_COMMUNITY)
Admission: RE | Disposition: A | Discharge status: Home / Self Care | Payer: Self-pay | Source: Ambulatory Visit | Attending: Internal Medicine

## 2015-04-08 ENCOUNTER — Ambulatory Visit (HOSPITAL_COMMUNITY): Payer: Medicare Other | Admitting: Anesthesiology

## 2015-04-08 ENCOUNTER — Encounter (HOSPITAL_COMMUNITY): Payer: Self-pay | Admitting: *Deleted

## 2015-04-08 ENCOUNTER — Ambulatory Visit (HOSPITAL_COMMUNITY)
Admission: RE | Admit: 2015-04-08 | Discharge: 2015-04-08 | Disposition: A | Discharge status: Home / Self Care | Payer: Medicare Other | Source: Ambulatory Visit | Attending: Internal Medicine | Admitting: Internal Medicine

## 2015-04-08 DIAGNOSIS — I2510 Atherosclerotic heart disease of native coronary artery without angina pectoris: Secondary | ICD-10-CM | POA: Diagnosis not present

## 2015-04-08 DIAGNOSIS — I480 Paroxysmal atrial fibrillation: Secondary | ICD-10-CM | POA: Insufficient documentation

## 2015-04-08 DIAGNOSIS — I4819 Other persistent atrial fibrillation: Secondary | ICD-10-CM | POA: Insufficient documentation

## 2015-04-08 DIAGNOSIS — Z79899 Other long term (current) drug therapy: Secondary | ICD-10-CM | POA: Diagnosis not present

## 2015-04-08 DIAGNOSIS — I5032 Chronic diastolic (congestive) heart failure: Secondary | ICD-10-CM | POA: Diagnosis not present

## 2015-04-08 DIAGNOSIS — I080 Rheumatic disorders of both mitral and aortic valves: Secondary | ICD-10-CM | POA: Insufficient documentation

## 2015-04-08 DIAGNOSIS — K219 Gastro-esophageal reflux disease without esophagitis: Secondary | ICD-10-CM | POA: Diagnosis not present

## 2015-04-08 DIAGNOSIS — I481 Persistent atrial fibrillation: Secondary | ICD-10-CM | POA: Diagnosis not present

## 2015-04-08 DIAGNOSIS — Z7901 Long term (current) use of anticoagulants: Secondary | ICD-10-CM | POA: Insufficient documentation

## 2015-04-08 DIAGNOSIS — M8580 Other specified disorders of bone density and structure, unspecified site: Secondary | ICD-10-CM | POA: Insufficient documentation

## 2015-04-08 DIAGNOSIS — I10 Essential (primary) hypertension: Secondary | ICD-10-CM | POA: Diagnosis not present

## 2015-04-08 DIAGNOSIS — Z859 Personal history of malignant neoplasm, unspecified: Secondary | ICD-10-CM | POA: Insufficient documentation

## 2015-04-08 DIAGNOSIS — R001 Bradycardia, unspecified: Secondary | ICD-10-CM | POA: Insufficient documentation

## 2015-04-08 DIAGNOSIS — G2581 Restless legs syndrome: Secondary | ICD-10-CM | POA: Insufficient documentation

## 2015-04-08 DIAGNOSIS — M1990 Unspecified osteoarthritis, unspecified site: Secondary | ICD-10-CM | POA: Insufficient documentation

## 2015-04-08 DIAGNOSIS — M810 Age-related osteoporosis without current pathological fracture: Secondary | ICD-10-CM | POA: Diagnosis not present

## 2015-04-08 DIAGNOSIS — Z86010 Personal history of colonic polyps: Secondary | ICD-10-CM | POA: Insufficient documentation

## 2015-04-08 DIAGNOSIS — E785 Hyperlipidemia, unspecified: Secondary | ICD-10-CM | POA: Diagnosis not present

## 2015-04-08 DIAGNOSIS — I4891 Unspecified atrial fibrillation: Secondary | ICD-10-CM | POA: Diagnosis not present

## 2015-04-08 DIAGNOSIS — I739 Peripheral vascular disease, unspecified: Secondary | ICD-10-CM | POA: Diagnosis not present

## 2015-04-08 HISTORY — PX: CARDIOVERSION: SHX1299

## 2015-04-08 SURGERY — CARDIOVERSION
Anesthesia: General

## 2015-04-08 MED ORDER — SODIUM CHLORIDE 0.9 % IV SOLN
INTRAVENOUS | Status: DC
  Administered 2015-04-08: 500 mL via INTRAVENOUS
  Administered 2015-04-08: 11:00:00 via INTRAVENOUS

## 2015-04-08 MED ORDER — PROPOFOL 10 MG/ML IV BOLUS
INTRAVENOUS | Status: DC | PRN
  Administered 2015-04-08: 40 mg via INTRAVENOUS

## 2015-04-08 MED ORDER — LIDOCAINE HCL (CARDIAC) 20 MG/ML IV SOLN
INTRAVENOUS | Status: DC | PRN
  Administered 2015-04-08: 60 mg via INTRAVENOUS

## 2015-04-08 NOTE — Transfer of Care (Signed)
Immediate Anesthesia Transfer of Care Note  Patient: Melanie Whitehead  Procedure(s) Performed: Procedure(s): CARDIOVERSION (N/A)  Patient Location: Endoscopy Unit  Anesthesia Type:General  Level of Consciousness: awake, alert  and oriented  Airway & Oxygen Therapy: Patient Spontanous Breathing  Post-op Assessment: Report given to RN, Post -op Vital signs reviewed and stable and Patient moving all extremities X 4  Post vital signs: Reviewed and stable  Last Vitals:  Filed Vitals:   04/08/15 0943  BP: 174/72  Temp: 36.5 C  Resp: 16    Complications: No apparent anesthesia complications

## 2015-04-08 NOTE — Discharge Instructions (Signed)
Electrical Cardioversion, Care After °Refer to this sheet in the next few weeks. These instructions provide you with information on caring for yourself after your procedure. Your health care provider may also give you more specific instructions. Your treatment has been planned according to current medical practices, but problems sometimes occur. Call your health care provider if you have any problems or questions after your procedure. °WHAT TO EXPECT AFTER THE PROCEDURE °After your procedure, it is typical to have the following sensations: °· Some redness on the skin where the shocks were delivered. If this is tender, a sunburn lotion or hydrocortisone cream may help. °· Possible return of an abnormal heart rhythm within hours or days after the procedure. °HOME CARE INSTRUCTIONS °· Take medicines only as directed by your health care provider. Be sure you understand how and when to take your medicine. °· Learn how to feel your pulse and check it often. °· Limit your activity for 48 hours after the procedure or as directed by your health care provider. °· Avoid or minimize caffeine and other stimulants as directed by your health care provider. °SEEK MEDICAL CARE IF: °· You feel like your heart is beating too fast or your pulse is not regular. °· You have any questions about your medicines. °· You have bleeding that will not stop. °SEEK IMMEDIATE MEDICAL CARE IF: °· You are dizzy or feel faint. °· It is hard to breathe or you feel short of breath. °· There is a change in discomfort in your chest. °· Your speech is slurred or you have trouble moving an arm or leg on one side of your body. °· You get a serious muscle cramp that does not go away. °· Your fingers or toes turn cold or blue. °  °This information is not intended to replace advice given to you by your health care provider. Make sure you discuss any questions you have with your health care provider. °  °Document Released: 11/16/2012 Document Revised: 02/16/2014  Document Reviewed: 11/16/2012 °Elsevier Interactive Patient Education ©2016 [employer] ° °

## 2015-04-08 NOTE — Anesthesia Postprocedure Evaluation (Signed)
Anesthesia Post Note  Patient: Melanie Whitehead  Procedure(s) Performed: Procedure(s) (LRB): CARDIOVERSION (N/A)  Patient location during evaluation: PACU Anesthesia Type: General Level of consciousness: awake and alert Pain management: pain level controlled Vital Signs Assessment: post-procedure vital signs reviewed and stable Respiratory status: spontaneous breathing, nonlabored ventilation and respiratory function stable Cardiovascular status: blood pressure returned to baseline and stable Postop Assessment: no signs of nausea or vomiting Anesthetic complications: no    Last Vitals:  Filed Vitals:   04/08/15 1130 04/08/15 1140  BP: 141/44 143/44  Pulse: 50 52  Temp:    Resp: 19 18    Last Pain: There were no vitals filed for this visit.               Kalima Saylor,W. EDMOND

## 2015-04-08 NOTE — Anesthesia Preprocedure Evaluation (Addendum)
Anesthesia Evaluation  Patient identified by MRN, date of birth, ID band Patient awake    Reviewed: Allergy & Precautions, H&P , NPO status , Patient's Chart, lab work & pertinent test results  Airway Mallampati: III  TM Distance: >3 FB Neck ROM: Full    Dental no notable dental hx. (+) Teeth Intact, Dental Advisory Given   Pulmonary neg pulmonary ROS,    Pulmonary exam normal breath sounds clear to auscultation       Cardiovascular hypertension, Pt. on medications + CAD, + Peripheral Vascular Disease and +CHF  + dysrhythmias Atrial Fibrillation  Rhythm:Irregular Rate:Normal     Neuro/Psych negative neurological ROS  negative psych ROS   GI/Hepatic Neg liver ROS, GERD  ,  Endo/Other  negative endocrine ROS  Renal/GU negative Renal ROS  negative genitourinary   Musculoskeletal  (+) Arthritis , Osteoarthritis,    Abdominal   Peds  Hematology negative hematology ROS (+)   Anesthesia Other Findings   Reproductive/Obstetrics negative OB ROS                            Anesthesia Physical Anesthesia Plan  ASA: III  Anesthesia Plan: General   Post-op Pain Management:    Induction: Intravenous  Airway Management Planned: Mask  Additional Equipment:   Intra-op Plan:   Post-operative Plan:   Informed Consent: I have reviewed the patients History and Physical, chart, labs and discussed the procedure including the risks, benefits and alternatives for the proposed anesthesia with the patient or authorized representative who has indicated his/her understanding and acceptance.   Dental advisory given  Plan Discussed with: CRNA  Anesthesia Plan Comments:         Anesthesia Quick Evaluation

## 2015-04-08 NOTE — H&P (View-Only) (Signed)
CARDIOLOGY OFFICE NOTE  Date:  04/01/2015    Melanie Whitehead Date of Birth: 08/02/1924 Medical Record [id number]  PCP:  Horatio Pel, MD  Cardiologist:  Allred    Chief Complaint  Patient presents with  . Palpitations  . Atrial Fibrillation    Follow up visit - seen for Dr. Rayann Heman.     History of Present Illness: Melanie Whitehead is a 82 y.o. female who presents today for a follow up visit. Seen for Dr. Rayann Heman. She has a history of paroxysmal atrial fibrillation, bilateral subclavian artery stenosis, mitral regurgitation, HTN, HL, GERD, and PVD.   She was seen in March of 2014 after a recent admission to the hospital with recurrent atrial fibrillation. This was complicated by acute on chronic diastolic CHF. She was placed on amiodarone. She underwent cardioversion. She was seen by Dr. Rayann Heman 05/10/13. She remained in sinus rhythm. Her ECG demonstrated 1:1 versus 2:1 AV conduction. Therefore, diltiazem was stopped and her amiodarone dose was decreased.   Seen by Richardson Dopp, PA back in April of 2014 - seemed to be doing ok but with some atypical CP - Myoview updated and this turned out ok.   Since then, she has had her dose of amiodarone cut back due to bradycardia. She has had a holter. Has seen Dr. Rayann Heman back in follow up. Trying to continue with her current regimen and avoid PPM if possible.   I last saw her in September and she was doing well.   Was here earlier this month and saw Ermalinda Barrios, [place] - had moved to SNF. HR noted to be elevated by the staff. She was brought back here and noted to be back in AF. Amiodarone was increased with plans for possible cardioversion. She had missed one dose of Pradaxa - almost 3 weeks ago - one dose only.  It was felt that she would not tolerate long term AF due to prior experience.   Comes in today. Here with her daughter [name] today. She is doing ok. Not really symptomatic - may be a little more short of breath but really  feels ok. Not dizzy or lightheaded. Energy level may be a little less. Almost 3 weeks now from her one missed dose of Pradaxa. No falls. Daughter notes that she seems less symptomatic with this spell.   Past Medical History  Diagnosis Date  . BUNDLE BRANCH BLOCK, LEFT 07/09/2006  . CAROTID BRUIT, LEFT 07/08/2007  . COLONIC POLYPS, HX OF 07/09/2006  . DEGENERATIVE JOINT DISEASE 07/09/2006  . GERD 07/13/2008  . HYPERLIPIDEMIA 07/09/2006  . HYPERTENSION 07/09/2006  . OSTEOPOROSIS 07/09/2006  . PAROXYSMAL ATRIAL FIBRILLATION     a. Dx 09/2009;  b. chronic pradaxa;  c. recurrence 04/2013->amiodarone initiated.  Marland Kitchen PVD (peripheral vascular disease) (HCC)     a. moderate R and severe L subclavian artery stenosis  . CAD (coronary artery disease)     a. 2011 Cath: minimal, nonobstructive dzs.  Marland Kitchen Hx of colonoscopy   . Left ventricular outflow tract obstruction     a. due to dynamic obstruction with LVH  . Moderate mitral regurgitation     a. 12/2010 Echo: EF 65-70%, mild AI, mod MR, mod-sev dil LA, PASP 6mmHg.  . SCC (squamous cell carcinoma)   . Restless leg syndrome   . Osteopenia   . LVH (left ventricular hypertrophy)   . CHF (congestive heart failure) ([place])   . Atrial fibrillation ([place])   . Esophageal reflux   .  Labial cyst   . Hx of cardiovascular stress test 2015    Lexiscan Myoview (05/2013):  No ischemia, EF 58%, normal study    Past Surgical History  Procedure Laterality Date  . Two para two      abortus zero  . Tonsillectomy    . 2-d echo  12/2005    revealed normal LV function and size.  No wall motion at around his  moderate mitral annular calcification.  Elevated LVOT gradient of 22 thought secondary to narrow outflow  tract aortic valve appeared to open well  . Low risk cardiolite  12/2005  . Cataract extraction Bilateral   . Glaucoma surgery    . Pars plana vitrectomy w/ repair of macular hole    . Cardioversion N/A 05/05/2013    Procedure: CARDIOVERSION;  Surgeon: [name], MD;  Location: [facility] ENDOSCOPY;  Service: Cardiovascular;  Laterality: N/A;     Medications: Current Outpatient Prescriptions  Medication Sig Dispense Refill  . amiodarone (PACERONE) 200 MG tablet Take 200mg  twice daily for 5 days. Then take 200mg  daily (Patient taking differently: No sig reported) 45 tablet 2  . amLODipine (NORVASC) 2.5 MG tablet Take one by mouth daily, if BP consistently > 160 may take 2 135 tablet 3  . Biotin ([employer] BIOTIN HIGH POTENCY) 1000 MCG tablet Take 1,000 mcg by mouth daily.    . Calcium Carb-Cholecalciferol 600-400 MG-UNIT TABS Take 1 tablet by mouth 2 (two) times daily.     . Coenzyme Q10 (CO Q 10) 100 MG CAPS Take 1 capsule by mouth daily.     . dabigatran (PRADAXA) 75 MG CAPS capsule TAKE ONE CAPSULE BY MOUTH EVERY 12 HOURS 180 capsule 2  . denosumab (PROLIA) 60 MG/ML SOLN injection Inject 60 mg into the skin every 6 (six) months. Administer in upper arm, thigh, or abdomen    . furosemide (LASIX) 20 MG tablet Take 1/2 tablet by mouth three times a week (M/W/F) 30 tablet 2  . Multiple Vitamin (MULTIVITAMIN) tablet Take 1 tablet by mouth daily.      . nitroGLYCERIN (NITRODUR - DOSED IN MG/24 HR) 0.2 mg/hr patch Place 0.2 mg onto the skin daily. Take patch off after 12 hours (ONLY USE 1 PATCH A DAY)    . nitroGLYCERIN (NITROSTAT) 0.4 MG SL tablet Place 1 tablet (0.4 mg total) under the tongue every 5 (five) minutes as needed. 25 tablet 3  . rOPINIRole (REQUIP) 1 MG tablet Take 1 mg by mouth at bedtime.    . rosuvastatin (CRESTOR) 5 MG tablet Take 1 tablet (5 mg total) by mouth daily. 90 tablet 3  . valsartan (DIOVAN) 160 MG tablet Take 1 tablet (160 mg total) by mouth daily. 90 tablet 2   No current facility-administered medications for this visit.    Allergies: Allergies  Allergen Reactions  . Sulfa Antibiotics Nausea And Vomiting  . Sulfamethoxazole     REACTION: unspecified UNKNOWN    Social History: The patient  reports that she has never  smoked. She has never used smokeless tobacco. She reports that she does not drink alcohol or use illicit drugs.   Family History: The patient's family history includes Coronary artery disease in her mother; Heart attack in her mother; Stroke in her father.   Review of Systems: Please see the history of present illness.   Otherwise, the review of systems is positive for none.   All other systems are reviewed and negative.   Physical Exam: VS:  BP 128/70  mmHg  Pulse 96  Ht 4\' 11"  (1.499 m)  Wt 131 lb 9.6 oz (59.693 kg)  BMI 26.57 kg/m2 .  BMI Body mass index is 26.57 kg/(m^2).  Wt Readings from Last 3 Encounters:  04/01/15 131 lb 9.6 oz (59.693 kg)  03/25/15 131 lb 12.8 oz (59.784 kg)  10/31/14 131 lb 12.8 oz (59.784 kg)    General: Pleasant. Elderly female who is alert and in no acute distress.  HEENT: Normal. Neck: Supple, no JVD, carotid bruits, or masses noted.  Cardiac: Irregular irregular rhythm. Rate is ok. No murmurs, rubs, or gallops. No edema.  Respiratory:  Lungs are clear to auscultation bilaterally with normal work of breathing.  GI: Soft and nontender.  MS: No deformity or atrophy. Gait and ROM intact. Skin: Warm and dry. Color is normal.  Neuro:  Strength and sensation are intact and no gross focal deficits noted.  Psych: Alert, appropriate and with normal affect.   LABORATORY DATA:  EKG:  EKG is ordered today. This demonstrates atrial fib with a LBBB - rate is ok.  Lab Results  Component Value Date   WBC 5.7 10/31/2014   HGB 13.1 10/31/2014   HCT 39.5 10/31/2014   PLT 138.0* 10/31/2014   GLUCOSE 85 10/31/2014   CHOL 152 06/24/2011   TRIG 155.0* 06/24/2011   HDL 42.50 06/24/2011   LDLDIRECT 101.7 07/15/2009   LDLCALC 79 06/24/2011   ALT 15 10/31/2014   AST 19 10/31/2014   NA 140 10/31/2014   K 4.6 10/31/2014   CL 104 10/31/2014   CREATININE 1.57* 10/31/2014   BUN 29* 10/31/2014   CO2 32 10/31/2014   TSH 3.36 10/31/2014   INR 1.3* 05/02/2013     BNP (last 3 results) No results for input(s): BNP in the last 8760 hours.  ProBNP (last 3 results) No results for input(s): PROBNP in the last 8760 hours.   Other Studies Reviewed Today:  Myoview Impression from April 2015 Exercise Capacity: [place] with no exercise. BP Response: Normal blood pressure response. Clinical Symptoms: No significant symptoms noted. ECG Impression: No significant ST segment change suggestive of  ischemia. Comparison with Prior Nuclear Study: No significant change from  previous study  Overall Impression: Normal stress nuclear study.  LV Wall Motion: NL LV Function; NL Wall Motion   Lorretta Harp, MD  Echo Study Conclusions from 05/2013  - Left ventricle: Wall thickness was increased in a pattern of moderate LVH. Systolic function was mildly reduced. The estimated ejection fraction was in the range of 45% to 50%. Wall motion was normal; there were no regional wall motion abnormalities. - Aortic valve: Mild regurgitation. - Mitral valve: Mildly to moderately calcified annulus. Moderate regurgitation. - Left atrium: The atrium was mildly dilated. - Tricuspid valve: Moderate regurgitation. - Pulmonary arteries: PA peak pressure: 63mm Hg (S). - Pericardium, extracardiac: A trivial pericardial effusion was identified  Assessment / Plan: 1. PAF - she has reverted back to AF from last visit. Her dose of amiodarone was increased. I still worry that bradycardia is a trigger for her. Will arrange for cardioversion next Monday.  CHADSVASC is 6 with a 10% stroke risk annually. She does not appear to be symptomatic with this most recent episode.   2. Chronic anticoagulation with Pradaxa - last creatinine was 1.5. She missed only ONE dose about 3 weeks ago. Cardioversion for next week when this will be 4 weeks after ONE missed dose.   2. LV hypertrophy with hyperdynamic LV - last echo  noted.    4. HLD on statin therapy. Lipids by  PCP  5. Resting Bradycardia - asymptomatic in the past but has had to have her dose of amiodarone cut back. PPM may be indicated going forward or just managing her with rate control and anticoagulation. Discussed with Dr. Rayann Heman - would like to hold on PPM and if needed - will manage with rate control and anticoagulation.   Current medicines are reviewed with the patient today.  The patient does not have concerns regarding medicines other than what has been noted above.  The following changes have been made:  See above.  Labs/ tests ordered today include:    Orders Placed This Encounter  Procedures  . Basic metabolic panel  . CBC  . Protime-INR  . APTT  . TSH  . EKG 12-Lead     Disposition:   FU with me post cardioversion with EKG.   Patient is agreeable to this plan and will call if any problems develop in the interim.   Signed: Burtis Junes, RN, ANP-C 04/01/2015 10:55 AM  [facility] [address] [place], [place]  [address] Phone: [phone] Fax: [phone]

## 2015-04-08 NOTE — Interval H&P Note (Signed)
History and Physical Interval Note:  04/08/2015 9:30 AM  Melanie Whitehead  has presented today for surgery, with the diagnosis of A FIB  The various methods of treatment have been discussed with the patient and family. After consideration of risks, benefits and other options for treatment, the patient has consented to  Procedure(s): CARDIOVERSION (N/A) as a surgical intervention .  The patient's history has been reviewed, patient examined, no change in status, stable for surgery.  I have reviewed the patient's chart and labs.  Questions were answered to the patient's satisfaction.     [name]

## 2015-04-08 NOTE — Op Note (Signed)
Pt anesthetized with 40 mg Propofol and 60 mg lidocaine by anesthesia   With pads in AP positoin, pt cardioverted to SR with 150 J synchronized biphasic energy Procedure without complication. 12 lead EKG pending.

## 2015-04-09 ENCOUNTER — Encounter (HOSPITAL_COMMUNITY): Payer: Self-pay | Admitting: Internal Medicine

## 2015-04-11 DIAGNOSIS — M810 Age-related osteoporosis without current pathological fracture: Secondary | ICD-10-CM | POA: Diagnosis not present

## 2015-04-15 ENCOUNTER — Encounter: Payer: Self-pay | Admitting: Nurse Practitioner

## 2015-04-15 ENCOUNTER — Ambulatory Visit (INDEPENDENT_AMBULATORY_CARE_PROVIDER_SITE_OTHER): Payer: Medicare Other | Admitting: Nurse Practitioner

## 2015-04-15 VITALS — BP 150/54 | HR 45 | Ht 59.0 in | Wt 133.0 lb

## 2015-04-15 DIAGNOSIS — I4891 Unspecified atrial fibrillation: Secondary | ICD-10-CM | POA: Diagnosis not present

## 2015-04-15 DIAGNOSIS — I480 Paroxysmal atrial fibrillation: Secondary | ICD-10-CM

## 2015-04-15 DIAGNOSIS — Z7901 Long term (current) use of anticoagulants: Secondary | ICD-10-CM | POA: Diagnosis not present

## 2015-04-15 NOTE — Progress Notes (Signed)
CARDIOLOGY OFFICE NOTE  Date:  04/15/2015    Melanie Whitehead Date of Birth: 02-16-1924 Medical Record [id number]  PCP:  Horatio Pel, MD  Cardiologist:  Allred    Chief Complaint  Patient presents with  . Atrial Fibrillation    Post cardioversion - seen for Dr. Rayann Heman    History of Present Illness: Melanie Whitehead is a 82 y.o. female who presents today for a post cardioversion visit. Seen for Dr. Rayann Heman.   She has a history of paroxysmal atrial fibrillation, bilateral subclavian artery stenosis, mitral regurgitation, HTN, HL, GERD, and PVD.   She was seen in March of 2014 after a recent admission to the hospital with recurrent atrial fibrillation. This was complicated by acute on chronic diastolic CHF. She was placed on amiodarone. She underwent cardioversion. She was seen by Dr. Rayann Heman 05/10/13. She remained in sinus rhythm. Her ECG demonstrated 1:1 versus 2:1 AV conduction. Therefore, diltiazem was stopped and her amiodarone dose was decreased.   Seen by Richardson Dopp, PA back in April of 2014 - seemed to be doing ok but with some atypical CP - Myoview updated and this turned out ok.   Since then, she had to have her dose of amiodarone cut back due to bradycardia. Trying to continue with her current regimen and avoid PPM if possible.   I last saw her in September of 2016 and she was doing well.   Was here in February and saw Ermalinda Barrios, [place] - had moved to SNF. HR noted to be elevated by the staff. She was brought back here and noted to be back in AF. Amiodarone was increased with plans for possible cardioversion. She had missed one dose of Pradaxa - almost 3 weeks prior - one dose only. It was felt that she would not tolerate long term AF due to prior experience. I saw her back and referred her on for cardioversion. She was not really symptomatic with this bout of AF.   Comes in today. Here with her daughter Melanie Whitehead today. Doing ok. Says she may have a "bit more  energy" but feels like she is at her baseline. Not dizzy or lightheaded. No falls. No palpitations. She is on 200 mg of amiodarone daily.   Past Medical History  Diagnosis Date  . BUNDLE BRANCH BLOCK, LEFT 07/09/2006  . CAROTID BRUIT, LEFT 07/08/2007  . COLONIC POLYPS, HX OF 07/09/2006  . DEGENERATIVE JOINT DISEASE 07/09/2006  . GERD 07/13/2008  . HYPERLIPIDEMIA 07/09/2006  . HYPERTENSION 07/09/2006  . OSTEOPOROSIS 07/09/2006  . PAROXYSMAL ATRIAL FIBRILLATION     a. Dx 09/2009;  b. chronic pradaxa;  c. recurrence 04/2013->amiodarone initiated.  Marland Kitchen PVD (peripheral vascular disease) (HCC)     a. moderate R and severe L subclavian artery stenosis  . CAD (coronary artery disease)     a. 2011 Cath: minimal, nonobstructive dzs.  Marland Kitchen Hx of colonoscopy   . Left ventricular outflow tract obstruction     a. due to dynamic obstruction with LVH  . Moderate mitral regurgitation     a. 12/2010 Echo: EF 65-70%, mild AI, mod MR, mod-sev dil LA, PASP 34mmHg.  . SCC (squamous cell carcinoma)   . Restless leg syndrome   . Osteopenia   . LVH (left ventricular hypertrophy)   . CHF (congestive heart failure) ([place])   . Atrial fibrillation ([place])   . Esophageal reflux   . Labial cyst   . Hx of cardiovascular stress test 2015  Lexiscan Myoview (05/2013):  No ischemia, EF 58%, normal study    Past Surgical History  Procedure Laterality Date  . Two para two      abortus zero  . Tonsillectomy    . 2-d echo  12/2005    revealed normal LV function and size.  No wall motion at around his  moderate mitral annular calcification.  Elevated LVOT gradient of 22 thought secondary to narrow outflow  tract aortic valve appeared to open well  . Low risk cardiolite  12/2005  . Cataract extraction Bilateral   . Glaucoma surgery    . Pars plana vitrectomy w/ repair of macular hole    . Cardioversion N/A 05/05/2013    Procedure: CARDIOVERSION;  Surgeon: [name], MD;  Location: [facility];  Service: Cardiovascular;   Laterality: N/A;  . Cardioversion N/A 04/08/2015    Procedure: CARDIOVERSION;  Surgeon: Fay Records, MD;  Location: [facility] ENDOSCOPY;  Service: Cardiovascular;  Laterality: N/A;     Medications: Current Outpatient Prescriptions  Medication Sig Dispense Refill  . amiodarone (PACERONE) 200 MG tablet Take 200 mg by mouth daily.    Marland Kitchen amLODipine (NORVASC) 2.5 MG tablet Take 2.5 mg by mouth daily. If bp stays above 160 pt takes extra dose    . Biotin ([employer] BIOTIN HIGH POTENCY) 1000 MCG tablet Take 1,000 mcg by mouth daily.    . Calcium Carb-Cholecalciferol 600-400 MG-UNIT TABS Take 1 tablet by mouth 2 (two) times daily.     . Coenzyme Q10 (CO Q 10) 100 MG CAPS Take 1 capsule by mouth daily.     . dabigatran (PRADAXA) 75 MG CAPS capsule Take 75 mg by mouth 2 (two) times daily.    Marland Kitchen denosumab (PROLIA) 60 MG/ML SOLN injection Inject 60 mg into the skin every 6 (six) months. Administer in upper arm, thigh, or abdomen    . furosemide (LASIX) 20 MG tablet Take 10 mg by mouth 3 (three) times a week. M,W.F    . Multiple Vitamin (MULTIVITAMIN) tablet Take 1 tablet by mouth daily.      . nitroGLYCERIN (NITRODUR - DOSED IN MG/24 HR) 0.2 mg/hr patch Place 0.2 mg onto the skin daily. Take patch off after 12 hours (ONLY USE 1 PATCH A DAY)    . nitroGLYCERIN (NITROSTAT) 0.4 MG SL tablet Place 0.4 mg under the tongue every 5 (five) minutes as needed for chest pain.    Marland Kitchen rOPINIRole (REQUIP) 1 MG tablet Take 1 mg by mouth at bedtime.    . rosuvastatin (CRESTOR) 5 MG tablet Take 1 tablet (5 mg total) by mouth daily. 90 tablet 3  . valsartan (DIOVAN) 160 MG tablet Take 1 tablet (160 mg total) by mouth daily. 90 tablet 2   No current facility-administered medications for this visit.    Allergies: Allergies  Allergen Reactions  . Sulfa Antibiotics Nausea And Vomiting  . Sulfamethoxazole     REACTION: unspecified UNKNOWN    Social History: The patient  reports that she has never smoked. She has never used  smokeless tobacco. She reports that she does not drink alcohol or use illicit drugs.   Family History: The patient's family history includes Coronary artery disease in her mother; Heart attack in her mother; Stroke in her father.   Review of Systems: Please see the history of present illness.   Otherwise, the review of systems is positive for none.   All other systems are reviewed and negative.   Physical Exam: VS:  BP 150/54  mmHg  Pulse 45  Ht 4\' 11"  (1.499 m)  Wt 133 lb (60.328 kg)  BMI 26.85 kg/m2 .  BMI Body mass index is 26.85 kg/(m^2).  Wt Readings from Last 3 Encounters:  04/15/15 133 lb (60.328 kg)  04/08/15 131 lb (59.421 kg)  04/01/15 131 lb 9.6 oz (59.693 kg)    General: Pleasant. She looks younger than her stated age and is in no acute distress.  HEENT: Normal. Neck: Supple, no JVD, carotid bruits, or masses noted.  Cardiac: Regular rate and rhythm. Harsh outflow murmur.  No edema. HR by me is 52.  Respiratory:  Lungs are clear to auscultation bilaterally with normal work of breathing.  GI: Soft and nontender.  MS: No deformity or atrophy. Gait and ROM intact. Skin: Warm and dry. Color is normal.  Neuro:  Strength and sensation are intact and no gross focal deficits noted.  Psych: Alert, appropriate and with normal affect.   LABORATORY DATA:  EKG:  EKG is ordered today. This demonstrates marked sinus bradycardia with LBBB - rate of 49.  Lab Results  Component Value Date   WBC 7.2 04/01/2015   HGB 13.8 04/01/2015   HCT 41.3 04/01/2015   PLT 252 04/01/2015   GLUCOSE 86 04/01/2015   CHOL 152 06/24/2011   TRIG 155.0* 06/24/2011   HDL 42.50 06/24/2011   LDLDIRECT 101.7 07/15/2009   LDLCALC 79 06/24/2011   ALT 15 10/31/2014   AST 19 10/31/2014   NA 139 04/01/2015   K 5.1 04/01/2015   CL 102 04/01/2015   CREATININE 1.56* 04/01/2015   BUN 28* 04/01/2015   CO2 26 04/01/2015   TSH 2.69 04/01/2015   INR 1.62* 04/01/2015    BNP (last 3 results) No results  for input(s): BNP in the last 8760 hours.  ProBNP (last 3 results) No results for input(s): PROBNP in the last 8760 hours.   Other Studies Reviewed Today:  Myoview Impression from April 2015 Exercise Capacity: [place] with no exercise. BP Response: Normal blood pressure response. Clinical Symptoms: No significant symptoms noted. ECG Impression: No significant ST segment change suggestive of  ischemia. Comparison with Prior Nuclear Study: No significant change from  previous study  Overall Impression: Normal stress nuclear study.  LV Wall Motion: NL LV Function; NL Wall Motion   Lorretta Harp, MD  Echo Study Conclusions from 05/2013  - Left ventricle: Wall thickness was increased in a pattern of moderate LVH. Systolic function was mildly reduced. The estimated ejection fraction was in the range of 45% to 50%. Wall motion was normal; there were no regional wall motion abnormalities. - Aortic valve: Mild regurgitation. - Mitral valve: Mildly to moderately calcified annulus. Moderate regurgitation. - Left atrium: The atrium was mildly dilated. - Tricuspid valve: Moderate regurgitation. - Pulmonary arteries: PA peak pressure: 63mm Hg (S). - Pericardium, extracardiac: A trivial pericardial effusion was identified  Assessment / Plan: 1. PAF - she has been cardioverted back to NSR. Post cardioversion EKG not in EPIC to review.   Her dose of amiodarone remains at 200 mg a day. I still worry that bradycardia is a trigger for her AF. CHADSVASC is 6 with a 10% stroke risk annually and she remains on anticoagulation. She did not appear to be symptomatic with this most recent episode.   2. Chronic anticoagulation with Pradaxa - no problems noted.   2. LV hypertrophy with hyperdynamic LV - last echo noted.   4. HLD on statin therapy. Lipids by PCP  5. Resting  Bradycardia - asymptomatic in the past but had to have her dose of amiodarone cut back due to bradycardia in  the past. PPM may be indicated going forward or just manage her with rate control and anticoagulation. Discussed with Dr. Rayann Heman at her last visit and he would like to hold on PPM and if needed - would manage with rate control and anticoagulation. For now, I have left her on her current regimen. Plan to see her in one month and most likely cut her dose back to 100mg  a day. She is to call for any dizzy or lightheaded spells.   Current medicines are reviewed with the patient today.  The patient does not have concerns regarding medicines other than what has been noted above.  The following changes have been made:  See above.  Labs/ tests ordered today include:    Orders Placed This Encounter  Procedures  . EKG 12-Lead     Disposition:   FU with me in 4 weeks with EKG.   Patient is agreeable to this plan and will call if any problems develop in the interim.   Signed: Burtis Junes, RN, ANP-C 04/15/2015 12:08 PM  [facility] [address] [place], [place]  [address] Phone: [phone] Fax: [phone]

## 2015-04-15 NOTE — Patient Instructions (Addendum)
We will be checking the following labs today - NONE   Medication Instructions:    Continue with your current medicines.     Testing/Procedures To Be Arranged:  N/A  Follow-Up:   See Dr. Rayann Heman in one month with EKG or me on a day that he is here with EKG    Other Special Instructions:   N/A    If you need a refill on your cardiac medications before your next appointment, please call your pharmacy.   Call the [place] office at [phone] if you have any questions, problems or concerns.

## 2015-04-19 ENCOUNTER — Telehealth: Payer: Self-pay | Admitting: *Deleted

## 2015-04-19 NOTE — Telephone Encounter (Signed)
Patient is calling to speak to Cecille Rubin about adjusting her heart medication because her heart rate was been 40, 43, and 41 for the past couple of days.  Does not have any other complaints other than concern for her heart rate and wants your medication adjusted. She would personally like a call back from [place] if possible. She said she was told if her heart rate drops in the 30's she should call the office. It has not dropped in the 30's but she wanted to see what Cecille Rubin wants to do with it being in the 40's before the weekend starts. She asked what should she do if it drops in the 30's this weekend if she does not hear from [place],  I let the patient know that if her heart rate drops in the 30's over the weekend please report to her nearest Emergency Room to be seen and checked out. She states she is post cardioversion and just really wants to talk to Cecille Rubin and no one else because she was told to call Cecille Rubin.  I let patient know I will be sending this message to [facility] and her nurse.   Last Office Note: Assessment / Plan: 1. PAF - she has been cardioverted back to NSR. Post cardioversion EKG not in EPIC to review. Her dose of amiodarone remains at 200 mg a day. I still worry that bradycardia is a trigger for her AF. CHADSVASC is 6 with a 10% stroke risk annually and she remains on anticoagulation. She did not appear to be symptomatic with this most recent episode.   2. Chronic anticoagulation with Pradaxa - no problems noted.   2. LV hypertrophy with hyperdynamic LV - last echo noted.   4. HLD on statin therapy. Lipids by PCP  5. Resting Bradycardia - asymptomatic in the past but had to have her dose of amiodarone cut back due to bradycardia in the past. PPM may be indicated going forward or just manage her with rate control and anticoagulation. Discussed with Dr. Rayann Heman at her last visit and he would like to hold on PPM and if needed - would manage with rate control and anticoagulation. For now, I have left  her on her current regimen. Plan to see her in one month and most likely cut her dose back to 100mg  a day. She is to call for any dizzy or lightheaded spells.

## 2015-04-21 NOTE — Telephone Encounter (Signed)
Have her go ahead and cut amiodarone to 100 mg a day.  Monitor HR and call if does not improve.

## 2015-04-22 ENCOUNTER — Other Ambulatory Visit: Payer: Self-pay | Admitting: *Deleted

## 2015-04-22 ENCOUNTER — Telehealth: Payer: Self-pay | Admitting: *Deleted

## 2015-04-22 NOTE — Telephone Encounter (Signed)
S/w pt is agreeable to plan will cut amiodarone back to ( 100 mg ) daily and will keep a diary of HR. Pt stated HR yesterday 53, HR today 51. Changed medication on medicine list.

## 2015-04-30 DIAGNOSIS — R748 Abnormal levels of other serum enzymes: Secondary | ICD-10-CM | POA: Diagnosis not present

## 2015-04-30 DIAGNOSIS — W19XXXA Unspecified fall, initial encounter: Secondary | ICD-10-CM | POA: Diagnosis not present

## 2015-04-30 DIAGNOSIS — R4182 Altered mental status, unspecified: Secondary | ICD-10-CM | POA: Diagnosis not present

## 2015-04-30 DIAGNOSIS — I517 Cardiomegaly: Secondary | ICD-10-CM | POA: Diagnosis not present

## 2015-04-30 DIAGNOSIS — F0390 Unspecified dementia without behavioral disturbance: Secondary | ICD-10-CM | POA: Diagnosis not present

## 2015-04-30 DIAGNOSIS — R262 Difficulty in walking, not elsewhere classified: Secondary | ICD-10-CM | POA: Diagnosis not present

## 2015-04-30 DIAGNOSIS — I447 Left bundle-branch block, unspecified: Secondary | ICD-10-CM | POA: Diagnosis not present

## 2015-04-30 DIAGNOSIS — R7989 Other specified abnormal findings of blood chemistry: Secondary | ICD-10-CM | POA: Diagnosis not present

## 2015-04-30 DIAGNOSIS — M6282 Rhabdomyolysis: Secondary | ICD-10-CM | POA: Diagnosis not present

## 2015-04-30 DIAGNOSIS — Z7901 Long term (current) use of anticoagulants: Secondary | ICD-10-CM | POA: Diagnosis not present

## 2015-04-30 DIAGNOSIS — I10 Essential (primary) hypertension: Secondary | ICD-10-CM | POA: Diagnosis not present

## 2015-04-30 DIAGNOSIS — M6281 Muscle weakness (generalized): Secondary | ICD-10-CM | POA: Diagnosis not present

## 2015-04-30 DIAGNOSIS — I080 Rheumatic disorders of both mitral and aortic valves: Secondary | ICD-10-CM | POA: Diagnosis not present

## 2015-04-30 DIAGNOSIS — R0602 Shortness of breath: Secondary | ICD-10-CM | POA: Diagnosis not present

## 2015-04-30 DIAGNOSIS — Z780 Asymptomatic menopausal state: Secondary | ICD-10-CM | POA: Diagnosis not present

## 2015-04-30 DIAGNOSIS — Z79899 Other long term (current) drug therapy: Secondary | ICD-10-CM | POA: Diagnosis not present

## 2015-04-30 DIAGNOSIS — N183 Chronic kidney disease, stage 3 (moderate): Secondary | ICD-10-CM | POA: Diagnosis not present

## 2015-04-30 DIAGNOSIS — M549 Dorsalgia, unspecified: Secondary | ICD-10-CM | POA: Diagnosis not present

## 2015-04-30 DIAGNOSIS — R55 Syncope and collapse: Secondary | ICD-10-CM | POA: Diagnosis not present

## 2015-04-30 DIAGNOSIS — S0990XA Unspecified injury of head, initial encounter: Secondary | ICD-10-CM | POA: Diagnosis not present

## 2015-04-30 DIAGNOSIS — I480 Paroxysmal atrial fibrillation: Secondary | ICD-10-CM | POA: Diagnosis not present

## 2015-04-30 DIAGNOSIS — R740 Nonspecific elevation of levels of transaminase and lactic acid dehydrogenase [LDH]: Secondary | ICD-10-CM | POA: Diagnosis not present

## 2015-04-30 DIAGNOSIS — D696 Thrombocytopenia, unspecified: Secondary | ICD-10-CM | POA: Diagnosis not present

## 2015-04-30 DIAGNOSIS — Z882 Allergy status to sulfonamides status: Secondary | ICD-10-CM | POA: Diagnosis not present

## 2015-04-30 DIAGNOSIS — T796XXD Traumatic ischemia of muscle, subsequent encounter: Secondary | ICD-10-CM | POA: Diagnosis not present

## 2015-04-30 DIAGNOSIS — I129 Hypertensive chronic kidney disease with stage 1 through stage 4 chronic kidney disease, or unspecified chronic kidney disease: Secondary | ICD-10-CM | POA: Diagnosis not present

## 2015-04-30 DIAGNOSIS — G2581 Restless legs syndrome: Secondary | ICD-10-CM | POA: Diagnosis not present

## 2015-04-30 DIAGNOSIS — J111 Influenza due to unidentified influenza virus with other respiratory manifestations: Secondary | ICD-10-CM | POA: Diagnosis not present

## 2015-04-30 DIAGNOSIS — I4891 Unspecified atrial fibrillation: Secondary | ICD-10-CM | POA: Diagnosis not present

## 2015-04-30 DIAGNOSIS — E785 Hyperlipidemia, unspecified: Secondary | ICD-10-CM | POA: Diagnosis not present

## 2015-04-30 DIAGNOSIS — J101 Influenza due to other identified influenza virus with other respiratory manifestations: Secondary | ICD-10-CM | POA: Diagnosis not present

## 2015-04-30 DIAGNOSIS — N179 Acute kidney failure, unspecified: Secondary | ICD-10-CM | POA: Diagnosis not present

## 2015-05-06 ENCOUNTER — Ambulatory Visit: Payer: Medicare Other | Admitting: Internal Medicine

## 2015-05-06 DIAGNOSIS — R5381 Other malaise: Secondary | ICD-10-CM | POA: Diagnosis not present

## 2015-05-06 DIAGNOSIS — G2581 Restless legs syndrome: Secondary | ICD-10-CM | POA: Diagnosis not present

## 2015-05-06 DIAGNOSIS — M6281 Muscle weakness (generalized): Secondary | ICD-10-CM | POA: Diagnosis not present

## 2015-05-06 DIAGNOSIS — R262 Difficulty in walking, not elsewhere classified: Secondary | ICD-10-CM | POA: Diagnosis not present

## 2015-05-06 DIAGNOSIS — L03113 Cellulitis of right upper limb: Secondary | ICD-10-CM | POA: Diagnosis not present

## 2015-05-06 DIAGNOSIS — N179 Acute kidney failure, unspecified: Secondary | ICD-10-CM | POA: Diagnosis not present

## 2015-05-06 DIAGNOSIS — D696 Thrombocytopenia, unspecified: Secondary | ICD-10-CM | POA: Diagnosis not present

## 2015-05-06 DIAGNOSIS — E785 Hyperlipidemia, unspecified: Secondary | ICD-10-CM | POA: Diagnosis not present

## 2015-05-06 DIAGNOSIS — F0390 Unspecified dementia without behavioral disturbance: Secondary | ICD-10-CM | POA: Diagnosis not present

## 2015-05-06 DIAGNOSIS — I4891 Unspecified atrial fibrillation: Secondary | ICD-10-CM | POA: Diagnosis not present

## 2015-05-06 DIAGNOSIS — J101 Influenza due to other identified influenza virus with other respiratory manifestations: Secondary | ICD-10-CM | POA: Diagnosis not present

## 2015-05-06 DIAGNOSIS — T796XXA Traumatic ischemia of muscle, initial encounter: Secondary | ICD-10-CM | POA: Diagnosis not present

## 2015-05-06 DIAGNOSIS — S40021A Contusion of right upper arm, initial encounter: Secondary | ICD-10-CM | POA: Diagnosis not present

## 2015-05-06 DIAGNOSIS — I10 Essential (primary) hypertension: Secondary | ICD-10-CM | POA: Diagnosis not present

## 2015-05-06 DIAGNOSIS — I480 Paroxysmal atrial fibrillation: Secondary | ICD-10-CM | POA: Diagnosis not present

## 2015-05-06 DIAGNOSIS — I509 Heart failure, unspecified: Secondary | ICD-10-CM | POA: Diagnosis not present

## 2015-05-06 DIAGNOSIS — M6282 Rhabdomyolysis: Secondary | ICD-10-CM | POA: Diagnosis not present

## 2015-05-06 DIAGNOSIS — R7989 Other specified abnormal findings of blood chemistry: Secondary | ICD-10-CM | POA: Diagnosis not present

## 2015-05-06 DIAGNOSIS — M810 Age-related osteoporosis without current pathological fracture: Secondary | ICD-10-CM | POA: Diagnosis not present

## 2015-05-06 DIAGNOSIS — T796XXD Traumatic ischemia of muscle, subsequent encounter: Secondary | ICD-10-CM | POA: Diagnosis not present

## 2015-05-06 DIAGNOSIS — R740 Nonspecific elevation of levels of transaminase and lactic acid dehydrogenase [LDH]: Secondary | ICD-10-CM | POA: Diagnosis not present

## 2015-05-07 DIAGNOSIS — M810 Age-related osteoporosis without current pathological fracture: Secondary | ICD-10-CM | POA: Diagnosis not present

## 2015-05-07 DIAGNOSIS — T796XXA Traumatic ischemia of muscle, initial encounter: Secondary | ICD-10-CM | POA: Diagnosis not present

## 2015-05-07 DIAGNOSIS — L03113 Cellulitis of right upper limb: Secondary | ICD-10-CM | POA: Diagnosis not present

## 2015-05-07 DIAGNOSIS — R5381 Other malaise: Secondary | ICD-10-CM | POA: Diagnosis not present

## 2015-05-09 DIAGNOSIS — S40021A Contusion of right upper arm, initial encounter: Secondary | ICD-10-CM | POA: Diagnosis not present

## 2015-05-10 DIAGNOSIS — R740 Nonspecific elevation of levels of transaminase and lactic acid dehydrogenase [LDH]: Secondary | ICD-10-CM | POA: Diagnosis not present

## 2015-05-10 DIAGNOSIS — T796XXA Traumatic ischemia of muscle, initial encounter: Secondary | ICD-10-CM | POA: Diagnosis not present

## 2015-05-16 DIAGNOSIS — T796XXA Traumatic ischemia of muscle, initial encounter: Secondary | ICD-10-CM | POA: Diagnosis not present

## 2015-05-16 DIAGNOSIS — G2581 Restless legs syndrome: Secondary | ICD-10-CM | POA: Diagnosis not present

## 2015-05-16 DIAGNOSIS — I10 Essential (primary) hypertension: Secondary | ICD-10-CM | POA: Diagnosis not present

## 2015-05-21 ENCOUNTER — Ambulatory Visit: Payer: Medicare Other | Admitting: Nurse Practitioner

## 2015-05-22 DIAGNOSIS — M6281 Muscle weakness (generalized): Secondary | ICD-10-CM | POA: Diagnosis not present

## 2015-05-22 DIAGNOSIS — R260 Ataxic gait: Secondary | ICD-10-CM | POA: Diagnosis not present

## 2015-05-22 DIAGNOSIS — R278 Other lack of coordination: Secondary | ICD-10-CM | POA: Diagnosis not present

## 2015-05-22 DIAGNOSIS — M6282 Rhabdomyolysis: Secondary | ICD-10-CM | POA: Diagnosis not present

## 2015-05-22 DIAGNOSIS — R262 Difficulty in walking, not elsewhere classified: Secondary | ICD-10-CM | POA: Diagnosis not present

## 2015-05-22 DIAGNOSIS — F0390 Unspecified dementia without behavioral disturbance: Secondary | ICD-10-CM | POA: Diagnosis not present

## 2015-05-22 DIAGNOSIS — G8929 Other chronic pain: Secondary | ICD-10-CM | POA: Diagnosis not present

## 2015-05-22 DIAGNOSIS — G3184 Mild cognitive impairment, so stated: Secondary | ICD-10-CM | POA: Diagnosis not present

## 2015-05-24 DIAGNOSIS — G8929 Other chronic pain: Secondary | ICD-10-CM | POA: Diagnosis not present

## 2015-05-24 DIAGNOSIS — F0390 Unspecified dementia without behavioral disturbance: Secondary | ICD-10-CM | POA: Diagnosis not present

## 2015-05-24 DIAGNOSIS — M6282 Rhabdomyolysis: Secondary | ICD-10-CM | POA: Diagnosis not present

## 2015-05-24 DIAGNOSIS — R262 Difficulty in walking, not elsewhere classified: Secondary | ICD-10-CM | POA: Diagnosis not present

## 2015-05-24 DIAGNOSIS — R260 Ataxic gait: Secondary | ICD-10-CM | POA: Diagnosis not present

## 2015-05-24 DIAGNOSIS — M6281 Muscle weakness (generalized): Secondary | ICD-10-CM | POA: Diagnosis not present

## 2015-05-28 DIAGNOSIS — R262 Difficulty in walking, not elsewhere classified: Secondary | ICD-10-CM | POA: Diagnosis not present

## 2015-05-28 DIAGNOSIS — R260 Ataxic gait: Secondary | ICD-10-CM | POA: Diagnosis not present

## 2015-05-28 DIAGNOSIS — F0390 Unspecified dementia without behavioral disturbance: Secondary | ICD-10-CM | POA: Diagnosis not present

## 2015-05-28 DIAGNOSIS — M6282 Rhabdomyolysis: Secondary | ICD-10-CM | POA: Diagnosis not present

## 2015-05-28 DIAGNOSIS — M6281 Muscle weakness (generalized): Secondary | ICD-10-CM | POA: Diagnosis not present

## 2015-05-28 DIAGNOSIS — G8929 Other chronic pain: Secondary | ICD-10-CM | POA: Diagnosis not present

## 2015-05-29 DIAGNOSIS — M6282 Rhabdomyolysis: Secondary | ICD-10-CM | POA: Diagnosis not present

## 2015-05-29 DIAGNOSIS — F0390 Unspecified dementia without behavioral disturbance: Secondary | ICD-10-CM | POA: Diagnosis not present

## 2015-05-29 DIAGNOSIS — M6281 Muscle weakness (generalized): Secondary | ICD-10-CM | POA: Diagnosis not present

## 2015-05-29 DIAGNOSIS — R260 Ataxic gait: Secondary | ICD-10-CM | POA: Diagnosis not present

## 2015-05-29 DIAGNOSIS — R262 Difficulty in walking, not elsewhere classified: Secondary | ICD-10-CM | POA: Diagnosis not present

## 2015-05-29 DIAGNOSIS — G8929 Other chronic pain: Secondary | ICD-10-CM | POA: Diagnosis not present

## 2015-05-30 DIAGNOSIS — R260 Ataxic gait: Secondary | ICD-10-CM | POA: Diagnosis not present

## 2015-05-30 DIAGNOSIS — F0390 Unspecified dementia without behavioral disturbance: Secondary | ICD-10-CM | POA: Diagnosis not present

## 2015-05-30 DIAGNOSIS — R262 Difficulty in walking, not elsewhere classified: Secondary | ICD-10-CM | POA: Diagnosis not present

## 2015-05-30 DIAGNOSIS — M6281 Muscle weakness (generalized): Secondary | ICD-10-CM | POA: Diagnosis not present

## 2015-05-30 DIAGNOSIS — M6282 Rhabdomyolysis: Secondary | ICD-10-CM | POA: Diagnosis not present

## 2015-05-30 DIAGNOSIS — G8929 Other chronic pain: Secondary | ICD-10-CM | POA: Diagnosis not present

## 2015-05-31 DIAGNOSIS — I10 Essential (primary) hypertension: Secondary | ICD-10-CM | POA: Diagnosis not present

## 2015-05-31 DIAGNOSIS — T148 Other injury of unspecified body region: Secondary | ICD-10-CM | POA: Diagnosis not present

## 2015-06-04 DIAGNOSIS — G8929 Other chronic pain: Secondary | ICD-10-CM | POA: Diagnosis not present

## 2015-06-04 DIAGNOSIS — F0390 Unspecified dementia without behavioral disturbance: Secondary | ICD-10-CM | POA: Diagnosis not present

## 2015-06-04 DIAGNOSIS — R260 Ataxic gait: Secondary | ICD-10-CM | POA: Diagnosis not present

## 2015-06-04 DIAGNOSIS — R262 Difficulty in walking, not elsewhere classified: Secondary | ICD-10-CM | POA: Diagnosis not present

## 2015-06-04 DIAGNOSIS — M6282 Rhabdomyolysis: Secondary | ICD-10-CM | POA: Diagnosis not present

## 2015-06-04 DIAGNOSIS — M6281 Muscle weakness (generalized): Secondary | ICD-10-CM | POA: Diagnosis not present

## 2015-06-06 DIAGNOSIS — R262 Difficulty in walking, not elsewhere classified: Secondary | ICD-10-CM | POA: Diagnosis not present

## 2015-06-06 DIAGNOSIS — R260 Ataxic gait: Secondary | ICD-10-CM | POA: Diagnosis not present

## 2015-06-06 DIAGNOSIS — F0390 Unspecified dementia without behavioral disturbance: Secondary | ICD-10-CM | POA: Diagnosis not present

## 2015-06-06 DIAGNOSIS — M6281 Muscle weakness (generalized): Secondary | ICD-10-CM | POA: Diagnosis not present

## 2015-06-06 DIAGNOSIS — M6282 Rhabdomyolysis: Secondary | ICD-10-CM | POA: Diagnosis not present

## 2015-06-06 DIAGNOSIS — G8929 Other chronic pain: Secondary | ICD-10-CM | POA: Diagnosis not present

## 2015-06-07 DIAGNOSIS — G8929 Other chronic pain: Secondary | ICD-10-CM | POA: Diagnosis not present

## 2015-06-07 DIAGNOSIS — M6282 Rhabdomyolysis: Secondary | ICD-10-CM | POA: Diagnosis not present

## 2015-06-07 DIAGNOSIS — M6281 Muscle weakness (generalized): Secondary | ICD-10-CM | POA: Diagnosis not present

## 2015-06-07 DIAGNOSIS — R262 Difficulty in walking, not elsewhere classified: Secondary | ICD-10-CM | POA: Diagnosis not present

## 2015-06-07 DIAGNOSIS — F0390 Unspecified dementia without behavioral disturbance: Secondary | ICD-10-CM | POA: Diagnosis not present

## 2015-06-07 DIAGNOSIS — R260 Ataxic gait: Secondary | ICD-10-CM | POA: Diagnosis not present

## 2015-06-11 DIAGNOSIS — G3184 Mild cognitive impairment, so stated: Secondary | ICD-10-CM | POA: Diagnosis not present

## 2015-06-11 DIAGNOSIS — M6282 Rhabdomyolysis: Secondary | ICD-10-CM | POA: Diagnosis not present

## 2015-06-11 DIAGNOSIS — R278 Other lack of coordination: Secondary | ICD-10-CM | POA: Diagnosis not present

## 2015-06-11 DIAGNOSIS — F0390 Unspecified dementia without behavioral disturbance: Secondary | ICD-10-CM | POA: Diagnosis not present

## 2015-06-11 DIAGNOSIS — M6281 Muscle weakness (generalized): Secondary | ICD-10-CM | POA: Diagnosis not present

## 2015-06-11 DIAGNOSIS — R260 Ataxic gait: Secondary | ICD-10-CM | POA: Diagnosis not present

## 2015-06-11 DIAGNOSIS — G8929 Other chronic pain: Secondary | ICD-10-CM | POA: Diagnosis not present

## 2015-06-11 DIAGNOSIS — R262 Difficulty in walking, not elsewhere classified: Secondary | ICD-10-CM | POA: Diagnosis not present

## 2015-06-12 DIAGNOSIS — G8929 Other chronic pain: Secondary | ICD-10-CM | POA: Diagnosis not present

## 2015-06-12 DIAGNOSIS — M6281 Muscle weakness (generalized): Secondary | ICD-10-CM | POA: Diagnosis not present

## 2015-06-12 DIAGNOSIS — R260 Ataxic gait: Secondary | ICD-10-CM | POA: Diagnosis not present

## 2015-06-12 DIAGNOSIS — M6282 Rhabdomyolysis: Secondary | ICD-10-CM | POA: Diagnosis not present

## 2015-06-12 DIAGNOSIS — R262 Difficulty in walking, not elsewhere classified: Secondary | ICD-10-CM | POA: Diagnosis not present

## 2015-06-12 DIAGNOSIS — F0390 Unspecified dementia without behavioral disturbance: Secondary | ICD-10-CM | POA: Diagnosis not present

## 2015-06-13 DIAGNOSIS — G8929 Other chronic pain: Secondary | ICD-10-CM | POA: Diagnosis not present

## 2015-06-13 DIAGNOSIS — M6282 Rhabdomyolysis: Secondary | ICD-10-CM | POA: Diagnosis not present

## 2015-06-13 DIAGNOSIS — F0390 Unspecified dementia without behavioral disturbance: Secondary | ICD-10-CM | POA: Diagnosis not present

## 2015-06-13 DIAGNOSIS — R262 Difficulty in walking, not elsewhere classified: Secondary | ICD-10-CM | POA: Diagnosis not present

## 2015-06-13 DIAGNOSIS — R260 Ataxic gait: Secondary | ICD-10-CM | POA: Diagnosis not present

## 2015-06-13 DIAGNOSIS — M6281 Muscle weakness (generalized): Secondary | ICD-10-CM | POA: Diagnosis not present

## 2015-06-14 DIAGNOSIS — M810 Age-related osteoporosis without current pathological fracture: Secondary | ICD-10-CM | POA: Diagnosis not present

## 2015-08-20 ENCOUNTER — Encounter: Payer: Self-pay | Admitting: Nurse Practitioner

## 2015-08-20 ENCOUNTER — Ambulatory Visit (INDEPENDENT_AMBULATORY_CARE_PROVIDER_SITE_OTHER): Payer: Medicare Other | Admitting: Nurse Practitioner

## 2015-08-20 VITALS — BP 128/40 | HR 57 | Ht 60.0 in | Wt 122.4 lb

## 2015-08-20 DIAGNOSIS — Z79899 Other long term (current) drug therapy: Secondary | ICD-10-CM

## 2015-08-20 DIAGNOSIS — H1851 Endothelial corneal dystrophy: Secondary | ICD-10-CM | POA: Diagnosis not present

## 2015-08-20 DIAGNOSIS — Z7901 Long term (current) use of anticoagulants: Secondary | ICD-10-CM | POA: Diagnosis not present

## 2015-08-20 DIAGNOSIS — I480 Paroxysmal atrial fibrillation: Secondary | ICD-10-CM

## 2015-08-20 LAB — CBC
HCT: 39.6 % (ref 35.0–45.0)
Hemoglobin: 13.2 g/dL (ref 11.7–15.5)
MCH: 29.7 pg (ref 27.0–33.0)
MCHC: 33.3 g/dL (ref 32.0–36.0)
MCV: 89.2 fL (ref 80.0–100.0)
MPV: 12.6 fL — ABNORMAL HIGH (ref 7.5–12.5)
Platelets: 171 10*3/uL (ref 140–400)
RBC: 4.44 MIL/uL (ref 3.80–5.10)
RDW: 14.8 % (ref 11.0–15.0)
WBC: 6.4 10*3/uL (ref 3.8–10.8)

## 2015-08-20 MED ORDER — VALSARTAN 320 MG PO TABS: 320.0000 mg | ORAL_TABLET | Freq: Every day | ORAL | Status: DC

## 2015-08-20 NOTE — Patient Instructions (Addendum)
We will be checking the following labs today - BMET, CBC, TSH and HPF  Medication Instructions:    Continue with your current medicines.     Testing/Procedures To Be Arranged:  N/A  Follow-Up:   See me in 4 months    Other Special Instructions:   N/A    If you need a refill on your cardiac medications before your next appointment, please call your pharmacy.   Call the [place] office at [phone] if you have any questions, problems or concerns.

## 2015-08-20 NOTE — Progress Notes (Addendum)
CARDIOLOGY OFFICE NOTE  Date:  08/20/2015    Melanie Whitehead Date of Birth: 08-23-1924 Medical Record [id number]  PCP:  Melanie Pel, MD  Cardiologist:  [place]    Chief Complaint  Patient presents with  . Atrial Fibrillation    4 month check - seen for Dr. Rayann Whitehead    History of Present Illness: Melanie Whitehead is a 82 y.o. female who presents today for a 4 month check. Seen for Dr. Rayann Whitehead.   She has a history of paroxysmal atrial fibrillation, bilateral subclavian artery stenosis, mitral regurgitation, HTN, HL, GERD, and PVD.   She was seen in March of 2014 after a recent admission to the hospital with recurrent atrial fibrillation. This was complicated by acute on chronic diastolic CHF. She was placed on amiodarone. She underwent cardioversion. She was seen by Dr. Rayann Whitehead 05/10/13. She remained in sinus rhythm. Her ECG demonstrated 1:1 versus 2:1 AV conduction. Therefore, diltiazem was stopped and her amiodarone dose was decreased.   Seen by Melanie Dopp, PA back in April of 2014 - seemed to be doing ok but with some atypical CP - Myoview updated and this turned out ok.   Since then, she had to have her dose of amiodarone cut back due to bradycardia. Trying to avoid PPM if possible.   Was here in February and saw Melanie Whitehead, [place] - had moved to SNF. HR noted to be elevated by the staff. She was brought back here and noted to be back in AF. Amiodarone was increased for a short period with plans for possible cardioversion - this was successful. She had missed one dose of Pradaxa - almost 3 weeks prior - one dose only. It is felt that she would not tolerate long term AF due to prior experience.   Last seen back in March and she was felt to be at her baseline. Remained in NSR.   Comes in today. Here with her [name] today. She has had quite a time since her last visit with me. She and her husband both got the flu back in March - both ended up falling in  the floor - was not found until the next day. Hospitalized at [facility]. Had rhabdo. Was massively hydrated and then treated for heart failure. She and her husband both survived - both went to rehab. No rhythm issues during that time.   Past Medical History  Diagnosis Date  . BUNDLE BRANCH BLOCK, LEFT 07/09/2006  . CAROTID BRUIT, LEFT 07/08/2007  . COLONIC POLYPS, HX OF 07/09/2006  . DEGENERATIVE JOINT DISEASE 07/09/2006  . GERD 07/13/2008  . HYPERLIPIDEMIA 07/09/2006  . HYPERTENSION 07/09/2006  . OSTEOPOROSIS 07/09/2006  . PAROXYSMAL ATRIAL FIBRILLATION     a. Dx 09/2009;  b. chronic pradaxa;  c. recurrence 04/2013->amiodarone initiated.  Marland Kitchen PVD (peripheral vascular disease) (HCC)     a. moderate R and severe L subclavian artery stenosis  . CAD (coronary artery disease)     a. 2011 Cath: minimal, nonobstructive dzs.  Marland Kitchen Hx of colonoscopy   . Left ventricular outflow tract obstruction     a. due to dynamic obstruction with LVH  . Moderate mitral regurgitation     a. 12/2010 Echo: EF 65-70%, mild AI, mod MR, mod-sev dil LA, PASP 98mmHg.  . SCC (squamous cell carcinoma)   . Restless leg syndrome   . Osteopenia   . LVH (left ventricular hypertrophy)   . CHF (congestive heart failure) ([place])   .  Atrial fibrillation ([place])   . Esophageal reflux   . Labial cyst   . Hx of cardiovascular stress test 2015    Lexiscan Myoview (05/2013):  No ischemia, EF 58%, normal study    Past Surgical History  Procedure Laterality Date  . Two para two      abortus zero  . Tonsillectomy    . 2-d echo  12/2005    revealed normal LV function and size.  No wall motion at around his  moderate mitral annular calcification.  Elevated LVOT gradient of 22 thought secondary to narrow outflow  tract aortic valve appeared to open well  . Low risk cardiolite  12/2005  . Cataract extraction Bilateral   . Glaucoma surgery    . Pars plana vitrectomy w/ repair of macular hole    . Cardioversion N/A 05/05/2013    Procedure:  CARDIOVERSION;  Surgeon: [name], MD;  Location: [place];  Service: Cardiovascular;  Laterality: N/A;  . Cardioversion N/A 04/08/2015    Procedure: CARDIOVERSION;  Surgeon: Fay Records, MD;  Location: [facility] ENDOSCOPY;  Service: Cardiovascular;  Laterality: N/A;     Medications: Current Outpatient Prescriptions  Medication Sig Dispense Refill  . amiodarone (PACERONE) 200 MG tablet Take 100 mg by mouth daily.    . Biotin ([employer]S BIOTIN HIGH POTENCY) 1000 MCG tablet Take 1,000 mcg by mouth daily.    . Calcium Carb-Cholecalciferol 600-400 MG-UNIT TABS Take 1 tablet by mouth 2 (two) times daily.     . dabigatran (PRADAXA) 75 MG CAPS capsule Take 75 mg by mouth 2 (two) times daily.    Marland Kitchen denosumab (PROLIA) 60 MG/ML SOLN injection Inject 60 mg into the skin every 6 (six) months. Administer in upper arm, thigh, or abdomen    . furosemide (LASIX) 20 MG tablet Take 10 mg by mouth 3 (three) times a week. M,W.F    . Lutein 6 MG CAPS Take by mouth.    . Multiple Vitamin (MULTIVITAMIN) tablet Take 1 tablet by mouth daily.      . nitroGLYCERIN (NITRODUR - DOSED IN MG/24 HR) 0.2 mg/hr patch Place 0.2 mg onto the skin daily. Take patch off after 12 hours (ONLY USE 1 PATCH A DAY)    . nitroGLYCERIN (NITROSTAT) 0.4 MG SL tablet Place 0.4 mg under the tongue every 5 (five) minutes as needed for chest pain.    Marland Kitchen rOPINIRole (REQUIP) 1 MG tablet Take 1 mg by mouth at bedtime.    . valsartan (DIOVAN) 320 MG tablet Take 1 tablet (320 mg total) by mouth daily. 90 tablet 3   No current facility-administered medications for this visit.    Allergies: Allergies  Allergen Reactions  . Sulfa Antibiotics Nausea And Vomiting  . Sulfamethoxazole     REACTION: unspecified UNKNOWN    Social History: The patient  reports that she has never smoked. She has never used smokeless tobacco. She reports that she does not drink alcohol or use illicit drugs.   Family History: The patient's family history includes  Coronary artery disease in her mother; Heart attack in her mother; Stroke in her father.   Review of Systems: Please see the history of present illness.   Otherwise, the review of systems is positive for none.   All other systems are reviewed and negative.   Physical Exam: VS:  BP 128/40 mmHg  Pulse 57  Ht 5' (1.524 m)  Wt 122 lb 6.4 oz (55.52 kg)  BMI 23.90 kg/m2 .  BMI Body mass  index is 23.9 kg/(m^2).  Wt Readings from Last 3 Encounters:  08/20/15 122 lb 6.4 oz (55.52 kg)  04/15/15 133 lb (60.328 kg)  04/08/15 131 lb (59.421 kg)    General: Pleasant. Well developed, well nourished and in no acute distress.   HEENT: Normal.  Neck: Supple, no JVD, carotid bruits, or masses noted.  Cardiac: Regular rate and rhythm. No murmurs, rubs, or gallops. No edema.  Respiratory:  Lungs are clear to auscultation bilaterally with normal work of breathing.  GI: Soft and nontender.  MS: No deformity or atrophy. Gait and ROM intact.  Skin: Warm and dry. Color is normal.  Neuro:  Strength and sensation are intact and no gross focal deficits noted.  Psych: Alert, appropriate and with normal affect.   LABORATORY DATA:  EKG:  EKG is ordered today. This demonstrates NSR with LBBB - her rate is 57 today.  Lab Results  Component Value Date   WBC 7.2 04/01/2015   HGB 13.8 04/01/2015   HCT 41.3 04/01/2015   PLT 252 04/01/2015   GLUCOSE 86 04/01/2015   CHOL 152 06/24/2011   TRIG 155.0* 06/24/2011   HDL 42.50 06/24/2011   LDLDIRECT 101.7 07/15/2009   LDLCALC 79 06/24/2011   ALT 15 10/31/2014   AST 19 10/31/2014   NA 139 04/01/2015   K 5.1 04/01/2015   CL 102 04/01/2015   CREATININE 1.56* 04/01/2015   BUN 28* 04/01/2015   CO2 26 04/01/2015   TSH 2.69 04/01/2015   INR 1.62* 04/01/2015    BNP (last 3 results) No results for input(s): BNP in the last 8760 hours.  ProBNP (last 3 results) No results for input(s): PROBNP in the last 8760 hours.   Other Studies Reviewed Today:  TTE  procedure: ECHOCARDIOGRAM W COLORFLOW SPECTRAL DOPPLER. Procedure date Date: 05/01/2015 Start: 08:52 AM Height: 59 inches Weight: 127 pounds BSA: 1.52 m2 BMI: 25.65 kg/m2 HR: 80 bpm BP: 167/66 mmHg Conclusions Summary MIld to moderate LVH Normal left ventricle size and systolic function, with no segmental abnormality. Moderate mitral annular calcification with mild gradient. Mild eccentric mitral regurgitation noted. Moderately dilated left atrium. Mild Aortic regurgitation. Signature ------------------------------------------------------------------------------ Electronically signed by Rozann Lesches, MD(Interpreting physician) on 05/01/2015 10:12 AM  Myoview Impression from April 2015 Exercise Capacity: [place] with no exercise. BP Response: Normal blood pressure response. Clinical Symptoms: No significant symptoms noted. ECG Impression: No significant ST segment change suggestive of  ischemia. Comparison with Prior Nuclear Study: No significant change from  previous study  Overall Impression: Normal stress nuclear study.  LV Wall Motion: NL LV Function; NL Wall Motion   Lorretta Harp, MD   Assessment / Plan: 1. PAF - CHADSVASC is 6 with a 10% stroke risk annually and she remains on anticoagulation. She remains in NSR.  Her rate is ok. No symptoms noted. On low dose amiodarone.   2. Chronic anticoagulation with Pradaxa - no problems noted.   2. LV hypertrophy with hyperdynamic LV - most recent echo done at [facility] noted  4. HLD - no longer on statin therapy - would not plan on restarting.   5. Resting Bradycardia - asymptomatic in the past but had to have her dose of amiodarone cut back due to bradycardia in the past. PPM may be indicated going forward or just manage her with rate control and anticoagulation. Discussed with Dr. Rayann Whitehead at past visit and he would like to hold on PPM and would favor managing her with rate control and anticoagulation. She does need  follow up  surveillance labs.   6. Prior event of rhabdo following the flu.    Current medicines are reviewed with the patient today.  The patient does not have concerns regarding medicines other than what has been noted above.  The following changes have been made:  See above.  Labs/ tests ordered today include:    Orders Placed This Encounter  Procedures  . Basic metabolic panel  . CBC  . Hepatic function panel  . TSH  . EKG 12-Lead     Disposition:   FU with me in 4  months.   Patient is agreeable to this plan and will call if any problems develop in the interim.   Signed: Burtis Junes, RN, ANP-C 08/20/2015 2:54 PM  [facility] [address] [place], [place]  [address] Phone: [phone] Fax: [phone]      Addendum:  Received the following correspondence thru My Chart regarding Ms. Ascencio needing to have corneal transplant. She may proceed with this procedure. Her anticoagulation did NOT need to be held. Her last visit with me was in July and she was not having any cardiac issues.   Burtis Junes, RN, [facility] [address] [place], [place]  [phone]  Hi Melanie Whitehead, This is Melanie Whitehead, Melanie Whitehead. Mom has Fuch's dystrophy and needs a corneal transplant. She will have it at [facility] with Dr. Isaiah Blakes on Oct 19. She will not need to come off her pradaxa as he will use drops- No injection at eye. She needs to have this done as she has lost most vision in her right eye. It will be under sedation only kind of like a cataract. Could you or Dr. Rayann Whitehead send a cardiology clearance to the [facility]?  The fax no. there is [phone]. Any questions, don't hesitate to call me. Thanks, Melanie Whitehead [phone]

## 2015-08-21 LAB — HEPATIC FUNCTION PANEL
ALT: 12 U/L (ref 6–29)
AST: 17 U/L (ref 10–35)
Albumin: 3.8 g/dL (ref 3.6–5.1)
Alkaline Phosphatase: 54 U/L (ref 33–130)
Bilirubin, Direct: 0.1 mg/dL (ref <=0.2)
Indirect Bilirubin: 0.5 mg/dL (ref 0.2–1.2)
Total Bilirubin: 0.6 mg/dL (ref 0.2–1.2)
Total Protein: 6 g/dL — ABNORMAL LOW (ref 6.1–8.1)

## 2015-08-21 LAB — BASIC METABOLIC PANEL
BUN: 41 mg/dL — ABNORMAL HIGH (ref 7–25)
CO2: 22 mmol/L (ref 20–31)
Calcium: 9.4 mg/dL (ref 8.6–10.4)
Chloride: 107 mmol/L (ref 98–110)
Creat: 1.67 mg/dL — ABNORMAL HIGH (ref 0.60–0.88)
Glucose, Bld: 140 mg/dL — ABNORMAL HIGH (ref 65–99)
Potassium: 4.3 mmol/L (ref 3.5–5.3)
Sodium: 139 mmol/L (ref 135–146)

## 2015-08-21 LAB — TSH: TSH: 3.25 mIU/L

## 2015-08-23 ENCOUNTER — Other Ambulatory Visit: Payer: Self-pay | Admitting: *Deleted

## 2015-08-23 DIAGNOSIS — R748 Abnormal levels of other serum enzymes: Secondary | ICD-10-CM

## 2015-08-30 DIAGNOSIS — I10 Essential (primary) hypertension: Secondary | ICD-10-CM | POA: Diagnosis not present

## 2015-08-30 DIAGNOSIS — R7989 Other specified abnormal findings of blood chemistry: Secondary | ICD-10-CM | POA: Diagnosis not present

## 2015-09-11 DIAGNOSIS — H1851 Endothelial corneal dystrophy: Secondary | ICD-10-CM | POA: Diagnosis not present

## 2015-09-17 DIAGNOSIS — Z5181 Encounter for therapeutic drug level monitoring: Secondary | ICD-10-CM | POA: Diagnosis not present

## 2015-09-23 ENCOUNTER — Other Ambulatory Visit: Payer: Medicare Other

## 2015-10-04 ENCOUNTER — Other Ambulatory Visit: Payer: Self-pay

## 2015-10-08 DIAGNOSIS — I4891 Unspecified atrial fibrillation: Secondary | ICD-10-CM | POA: Diagnosis not present

## 2015-10-08 DIAGNOSIS — I10 Essential (primary) hypertension: Secondary | ICD-10-CM | POA: Diagnosis not present

## 2015-10-22 DIAGNOSIS — I480 Paroxysmal atrial fibrillation: Secondary | ICD-10-CM | POA: Diagnosis not present

## 2015-10-22 DIAGNOSIS — M810 Age-related osteoporosis without current pathological fracture: Secondary | ICD-10-CM | POA: Diagnosis not present

## 2015-10-22 DIAGNOSIS — I5189 Other ill-defined heart diseases: Secondary | ICD-10-CM | POA: Diagnosis not present

## 2015-10-22 DIAGNOSIS — I10 Essential (primary) hypertension: Secondary | ICD-10-CM | POA: Diagnosis not present

## 2015-10-29 DIAGNOSIS — H18231 Secondary corneal edema, right eye: Secondary | ICD-10-CM | POA: Diagnosis not present

## 2015-11-07 ENCOUNTER — Encounter: Payer: Self-pay | Admitting: Nurse Practitioner

## 2015-11-08 ENCOUNTER — Telehealth: Payer: Self-pay | Admitting: *Deleted

## 2015-11-08 NOTE — Telephone Encounter (Signed)
Faxing Truitt Merle, NP, ov note for clearance for eye surgery.  Approved.

## 2015-11-28 ENCOUNTER — Encounter: Payer: Self-pay | Admitting: Nurse Practitioner

## 2015-11-28 DIAGNOSIS — H18231 Secondary corneal edema, right eye: Secondary | ICD-10-CM | POA: Diagnosis not present

## 2015-11-29 NOTE — Telephone Encounter (Signed)
-----   Message from Burtis Junes, NP sent at 11/29/2015  7:14 AM EDT ----- Please seen the MyChart message and call her daughter.   I would like to see her - Wednesday next week if possible or the following week.  Thanks lori

## 2015-11-29 NOTE — Telephone Encounter (Signed)
Called the pts Daughter [name], as instructed, to inform her that Cecille Rubin would like to see the pt in the office next Wednesday if possible, or the following week.  Per [name], she states she would prefer just bringing the pt into her currently scheduled appt with Cecille Rubin on 12/16/15 at 1:30 pm.  Per the Daughter, she states she would not be able to bring the pt to an earlier appt, due to long commutes and conflicting work schedules and other Doctor's appts.  Daughter states "I feel confident my mother will be ok until she see's Cecille Rubin on 11/6."  Daughter states that she just wanted to make [facility] aware of yesterday's incident with the pt.  Informed the Daughter that we will keep the pts appt as scheduled with Cecille Rubin for 11/6.  Informed the Daughter that I will update Cecille Rubin about this decision.  Advised the pts Daughter that if the pt has a change in condition or worsening symptoms, then she should notify our office to obtain a sooner appt. Daughter verbalized understanding and agrees with this plan.  Will route this message to Cecille Rubin as a general FYI.

## 2015-12-05 DIAGNOSIS — Z23 Encounter for immunization: Secondary | ICD-10-CM | POA: Diagnosis not present

## 2015-12-11 NOTE — Progress Notes (Deleted)
CARDIOLOGY OFFICE NOTE  Date:  12/11/2015    [name] Date of Birth: 09-17-1924 Medical Record [id number]  PCP:  Horatio Pel, MD  Cardiologist:  Servando Snare & ***    No chief complaint on file.   History of Present Illness: Melanie Whitehead is a 82 y.o. female who presents today for a ***   Comes in today. Here with   Past Medical History:  Diagnosis Date  . Atrial fibrillation ([place])   . BUNDLE BRANCH BLOCK, LEFT 07/09/2006  . CAD (coronary artery disease)    a. 2011 Cath: minimal, nonobstructive dzs.  . CAROTID BRUIT, LEFT 07/08/2007  . CHF (congestive heart failure) ([place])   . COLONIC POLYPS, HX OF 07/09/2006  . DEGENERATIVE JOINT DISEASE 07/09/2006  . Esophageal reflux   . GERD 07/13/2008  . Hx of cardiovascular stress test 2015   Lexiscan Myoview (05/2013):  No ischemia, EF 58%, normal study  . Hx of colonoscopy   . HYPERLIPIDEMIA 07/09/2006  . HYPERTENSION 07/09/2006  . Labial cyst   . Left ventricular outflow tract obstruction    a. due to dynamic obstruction with LVH  . LVH (left ventricular hypertrophy)   . Moderate mitral regurgitation    a. 12/2010 Echo: EF 65-70%, mild AI, mod MR, mod-sev dil LA, PASP 70mmHg.  . Osteopenia   . OSTEOPOROSIS 07/09/2006  . PAROXYSMAL ATRIAL FIBRILLATION    a. Dx 09/2009;  b. chronic pradaxa;  c. recurrence 04/2013->amiodarone initiated.  Marland Kitchen PVD (peripheral vascular disease) (HCC)    a. moderate R and severe L subclavian artery stenosis  . Restless leg syndrome   . SCC (squamous cell carcinoma)     Past Surgical History:  Procedure Laterality Date  . 2-d echo  12/2005   revealed normal LV function and size.  No wall motion at around his  moderate mitral annular calcification.  Elevated LVOT gradient of 22 thought secondary to narrow outflow  tract aortic valve appeared to open well  . CARDIOVERSION N/A 05/05/2013   Procedure: CARDIOVERSION;  Surgeon: [name], MD;  Location: [facility] ENDOSCOPY;  Service:  Cardiovascular;  Laterality: N/A;  . CARDIOVERSION N/A 04/08/2015   Procedure: CARDIOVERSION;  Surgeon: Fay Records, MD;  Location: [place];  Service: Cardiovascular;  Laterality: N/A;  . CATARACT EXTRACTION Bilateral   . GLAUCOMA SURGERY    . low risk cardiolite  12/2005  . PARS PLANA VITRECTOMY W/ REPAIR OF MACULAR HOLE    . TONSILLECTOMY    . two para two     abortus zero     Medications: Current Outpatient Prescriptions  Medication Sig Dispense Refill  . amiodarone (PACERONE) 200 MG tablet Take 100 mg by mouth daily.    . Biotin ([employer]S BIOTIN HIGH POTENCY) 1000 MCG tablet Take 1,000 mcg by mouth daily.    . Calcium Carb-Cholecalciferol 600-400 MG-UNIT TABS Take 1 tablet by mouth 2 (two) times daily.     . dabigatran (PRADAXA) 75 MG CAPS capsule Take 75 mg by mouth 2 (two) times daily.    Marland Kitchen denosumab (PROLIA) 60 MG/ML SOLN injection Inject 60 mg into the skin every 6 (six) months. Administer in upper arm, thigh, or abdomen    . furosemide (LASIX) 20 MG tablet Take 10 mg by mouth as needed. For weight gain of 3 lbs or more in 24 hours, And swelling     . Lutein 6 MG CAPS Take by mouth.    . Multiple Vitamin (MULTIVITAMIN) tablet Take 1  tablet by mouth daily.      . nitroGLYCERIN (NITRODUR - DOSED IN MG/24 HR) 0.2 mg/hr patch Place 0.2 mg onto the skin daily. Take patch off after 12 hours (ONLY USE 1 PATCH A DAY)    . nitroGLYCERIN (NITROSTAT) 0.4 MG SL tablet Place 0.4 mg under the tongue every 5 (five) minutes as needed for chest pain.    Marland Kitchen rOPINIRole (REQUIP) 1 MG tablet Take 1 mg by mouth at bedtime.    . valsartan (DIOVAN) 320 MG tablet Take 1 tablet (320 mg total) by mouth daily. 90 tablet 3   No current facility-administered medications for this visit.     Allergies: Allergies  Allergen Reactions  . Sulfa Antibiotics Nausea And Vomiting  . Sulfamethoxazole     REACTION: unspecified UNKNOWN    Social History: The patient  reports that she has never smoked. She has  never used smokeless tobacco. She reports that she does not drink alcohol or use drugs.   Family History: The patient's ***family history includes Coronary artery disease in her mother; Heart attack in her mother; Stroke in her father.   Review of Systems: Please see the history of present illness.   Otherwise, the review of systems is positive for {NONE DEFAULTED:18576::"none"}.   All other systems are reviewed and negative.   Physical Exam: VS:  There were no vitals taken for this visit. Marland Kitchen  BMI There is no height or weight on file to calculate BMI.  Wt Readings from Last 3 Encounters:  08/20/15 122 lb 6.4 oz (55.5 kg)  04/15/15 133 lb (60.3 kg)  04/08/15 131 lb (59.4 kg)    General: Pleasant. Well developed, well nourished and in no acute distress.   HEENT: Normal.  Neck: Supple, no JVD, carotid bruits, or masses noted.  Cardiac: ***Regular rate and rhythm. No murmurs, rubs, or gallops. No edema.  Respiratory:  Lungs are clear to auscultation bilaterally with normal work of breathing.  GI: Soft and nontender.  MS: No deformity or atrophy. Gait and ROM intact.  Skin: Warm and dry. Color is normal.  Neuro:  Strength and sensation are intact and no gross focal deficits noted.  Psych: Alert, appropriate and with normal affect.   LABORATORY DATA:  EKG:  EKG {ACTION; IS/IS [id number] ordered today. This demonstrates ***.  Lab Results  Component Value Date   WBC 6.4 08/20/2015   HGB 13.2 08/20/2015   HCT 39.6 08/20/2015   PLT 171 08/20/2015   GLUCOSE 140 (H) 08/20/2015   CHOL 152 06/24/2011   TRIG 155.0 (H) 06/24/2011   HDL 42.50 06/24/2011   LDLDIRECT 101.7 07/15/2009   LDLCALC 79 06/24/2011   ALT 12 08/20/2015   AST 17 08/20/2015   NA 139 08/20/2015   K 4.3 08/20/2015   CL 107 08/20/2015   CREATININE 1.67 (H) 08/20/2015   BUN 41 (H) 08/20/2015   CO2 22 08/20/2015   TSH 3.25 08/20/2015   INR 1.62 (H) 04/01/2015    BNP (last 3 results) No results for input(s):  BNP in the last 8760 hours.  ProBNP (last 3 results) No results for input(s): PROBNP in the last 8760 hours.   Other Studies Reviewed Today:   Assessment/Plan:   Current medicines are reviewed with the patient today.  The patient does not have concerns regarding medicines other than what has been noted above.  The following changes have been made:  See above.  Labs/ tests ordered today include:   No orders of the defined types  were placed in this encounter.    Disposition:   FU with *** in {gen number [id number] {Days to years:10300}.   Patient is agreeable to this plan and will call if any problems develop in the interim.   Signed: Burtis Junes, RN, ANP-C 12/11/2015 12:48 PM  [facility] [address] [place], [place]  [address] Phone: [phone] Fax: [phone]

## 2015-12-16 ENCOUNTER — Encounter: Payer: Self-pay | Admitting: Nurse Practitioner

## 2015-12-16 ENCOUNTER — Ambulatory Visit (INDEPENDENT_AMBULATORY_CARE_PROVIDER_SITE_OTHER): Payer: Medicare Other | Admitting: Nurse Practitioner

## 2015-12-16 VITALS — BP 128/62 | HR 94 | Ht 60.0 in | Wt 122.8 lb

## 2015-12-16 DIAGNOSIS — Z7901 Long term (current) use of anticoagulants: Secondary | ICD-10-CM | POA: Diagnosis not present

## 2015-12-16 DIAGNOSIS — I480 Paroxysmal atrial fibrillation: Secondary | ICD-10-CM | POA: Diagnosis not present

## 2015-12-16 DIAGNOSIS — Z79899 Other long term (current) drug therapy: Secondary | ICD-10-CM | POA: Diagnosis not present

## 2015-12-16 LAB — CBC
HCT: 41.1 % (ref 35.0–45.0)
Hemoglobin: 13.6 g/dL (ref 11.7–15.5)
MCH: 30 pg (ref 27.0–33.0)
MCHC: 33.1 g/dL (ref 32.0–36.0)
MCV: 90.7 fL (ref 80.0–100.0)
MPV: 12.8 fL — ABNORMAL HIGH (ref 7.5–12.5)
Platelets: 179 10*3/uL (ref 140–400)
RBC: 4.53 MIL/uL (ref 3.80–5.10)
RDW: 13.9 % (ref 11.0–15.0)
WBC: 5.4 10*3/uL (ref 3.8–10.8)

## 2015-12-16 LAB — TSH: TSH: 2.66 mIU/L

## 2015-12-16 MED ORDER — METOPROLOL SUCCINATE ER 25 MG PO TB24: 25.0000 mg | ORAL_TABLET | Freq: Every day | ORAL | 6 refills | Status: DC

## 2015-12-16 NOTE — Progress Notes (Signed)
CARDIOLOGY OFFICE NOTE  Date:  12/16/2015    [name] Date of Birth: [birth year] Medical Record [id number]  PCP:  PROVIDER NOT IN SYSTEM  Cardiologist:  Servando [facility]  Chief Complaint  Patient presents with  . Atrial Fibrillation    Follow up visit - seen for Dr. Rayann Heman    History of Present Illness: EMERSON CISAR is a 82 y.o. female who presents today for a follow up visit. Seen for Dr. Rayann Heman.   She has a history of paroxysmal atrial fibrillation, bilateral subclavian artery stenosis, mitral regurgitation, HTN, HL, GERD, and PVD.   She was seen in March of 2014 after a recent admission to the hospital with recurrent atrial fibrillation. This was complicated by acute on chronic diastolic CHF. She was placed on amiodarone. She underwent cardioversion. She was seen by Dr. Rayann Heman 05/10/13. She remained in sinus rhythm. Her ECG demonstrated 1:1 versus 2:1 AV conduction. Therefore, diltiazem was stopped and her amiodarone dose was decreased.   Seen by Richardson Dopp, PA back in April of 2014 - seemed to be doing ok but with some atypical CP - Myoview updated and this turned out ok.   Since then, she had to have her dose of amiodarone cut back due to bradycardia. Trying to avoid PPM if possible.   Was here in February and saw Ermalinda Barrios, [place] - had moved to SNF. HR noted to be elevated by the staff. She was brought back here and noted to be back in AF. Amiodarone was increased for a short period with plans for possible cardioversion - this was successful. She had missed one dose of Pradaxa - almost 3 weeks prior - one dose only. It is felt that she would not tolerate long term AF due to prior experience.   I last saw her back in July - cardiac status was ok but had had some serious health issues (flu which led to a fall which led to rhabdo).  I received emails back in September - had had some eye surgery - noted to be in AF - not symptomatic. Offered to see back  sooner but opted to wait until today.   Comes in today. Here with her daughter [name] today. She is doing ok. Feels pretty ok for the most part. No chest pain. Not dizzy or lightheaded. No passing out spells. She got her cornea surgery - did well. Was told then she was back in AF - she is not aware. No swelling. Weight is stable. She is content with her current status. HR is a little faster today. No recent labs reported.   Past Medical History:  Diagnosis Date  . Atrial fibrillation ([place])   . BUNDLE BRANCH BLOCK, LEFT 07/09/2006  . CAD (coronary artery disease)    a. 2011 Cath: minimal, nonobstructive dzs.  . CAROTID BRUIT, LEFT 07/08/2007  . CHF (congestive heart failure) ([place])   . COLONIC POLYPS, HX OF 07/09/2006  . DEGENERATIVE JOINT DISEASE 07/09/2006  . Esophageal reflux   . GERD 07/13/2008  . Hx of cardiovascular stress test 2015   Lexiscan Myoview (05/2013):  No ischemia, EF 58%, normal study  . Hx of colonoscopy   . HYPERLIPIDEMIA 07/09/2006  . HYPERTENSION 07/09/2006  . Labial cyst   . Left ventricular outflow tract obstruction    a. due to dynamic obstruction with LVH  . LVH (left ventricular hypertrophy)   . Moderate mitral regurgitation    a. 12/2010 Echo: EF 65-70%,  mild AI, mod MR, mod-sev dil LA, PASP 2mmHg.  . Osteopenia   . OSTEOPOROSIS 07/09/2006  . PAROXYSMAL ATRIAL FIBRILLATION    a. Dx 09/2009;  b. chronic pradaxa;  c. recurrence 04/2013->amiodarone initiated.  Marland Kitchen PVD (peripheral vascular disease) ([facility])    a. moderate R and severe L subclavian artery stenosis  . Restless leg syndrome   . SCC (squamous cell carcinoma)     Past Surgical History:  Procedure Laterality Date  . 2-d echo  12/2005   revealed normal LV function and size.  No wall motion at around his  moderate mitral annular calcification.  Elevated LVOT gradient of 22 thought secondary to narrow outflow  tract aortic valve appeared to open well  . CARDIOVERSION N/A 05/05/2013   Procedure: CARDIOVERSION;   Surgeon: [name], MD;  Location: [facility] ENDOSCOPY;  Service: Cardiovascular;  Laterality: N/A;  . CARDIOVERSION N/A 04/08/2015   Procedure: CARDIOVERSION;  Surgeon: Fay Records, MD;  Location: [facility];  Service: Cardiovascular;  Laterality: N/A;  . CATARACT EXTRACTION Bilateral   . GLAUCOMA SURGERY    . low risk cardiolite  12/2005  . PARS PLANA VITRECTOMY W/ REPAIR OF MACULAR HOLE    . TONSILLECTOMY    . two para two     abortus zero     Medications: Current Outpatient Prescriptions  Medication Sig Dispense Refill  . amiodarone (PACERONE) 200 MG tablet Take 100 mg by mouth daily.    . Biotin ([employer] BIOTIN HIGH POTENCY) 1000 MCG tablet Take 1,000 mcg by mouth daily.    . Calcium Carb-Cholecalciferol 600-400 MG-UNIT TABS Take 1 tablet by mouth 2 (two) times daily.     . dabigatran (PRADAXA) 75 MG CAPS capsule Take 75 mg by mouth 2 (two) times daily.    Marland Kitchen denosumab (PROLIA) 60 MG/ML SOLN injection Inject 60 mg into the skin every 6 (six) months. Administer in upper arm, thigh, or abdomen    . Lutein 6 MG CAPS Take by mouth.    . Multiple Vitamin (MULTIVITAMIN) tablet Take 1 tablet by mouth daily.      . nitroGLYCERIN (NITRODUR - DOSED IN MG/24 HR) 0.2 mg/hr patch Place 0.2 mg onto the skin daily. Take patch off after 12 hours (ONLY USE 1 PATCH A DAY)    . nitroGLYCERIN (NITROSTAT) 0.4 MG SL tablet Place 0.4 mg under the tongue every 5 (five) minutes as needed for chest pain.    . prednisoLONE acetate (PRED FORTE) 1 % ophthalmic suspension Place 1 drop into the right eye 4 (four) times daily.     Marland Kitchen rOPINIRole (REQUIP) 1 MG tablet Take 1 mg by mouth at bedtime.    . valsartan (DIOVAN) 320 MG tablet Take 1 tablet (320 mg total) by mouth daily. 90 tablet 3   No current facility-administered medications for this visit.     Allergies: Allergies  Allergen Reactions  . Sulfa Antibiotics Nausea And Vomiting  . Sulfamethoxazole     REACTION: unspecified UNKNOWN    Social  History: The patient  reports that she has never smoked. She has never used smokeless tobacco. She reports that she does not drink alcohol or use drugs.   Family History: The patient's family history includes Coronary artery disease in her mother; Heart attack in her mother; Stroke in her father.   Review of Systems: Please see the history of present illness.   Otherwise, the review of systems is positive for none.   All other systems are reviewed and negative.  Physical Exam: VS:  BP 128/62   Pulse 94   Ht 5' (1.524 m)   Wt 122 lb 12.8 oz (55.7 kg)   BMI 23.98 kg/m  .  BMI Body mass index is 23.98 kg/m.  Wt Readings from Last 3 Encounters:  12/16/15 122 lb 12.8 oz (55.7 kg)  08/20/15 122 lb 6.4 oz (55.5 kg)  04/15/15 133 lb (60.3 kg)    General: Pleasant. Elderly female who is alert and in no acute distress.   HEENT: Normal.  Neck: Supple, no JVD, carotid bruits, or masses noted.  Cardiac: Irregular irregular rhythm. Rate is in the 90's by me exam. Systolic murmur noted. No edema.  Respiratory:  Lungs are clear to auscultation bilaterally with normal work of breathing.  GI: Soft and nontender.  MS: No deformity or atrophy. Gait and ROM intact.  Skin: Warm and dry. Color is normal.  Neuro:  Strength and sensation are intact and no gross focal deficits noted.  Psych: Alert, appropriate and with normal affect.   LABORATORY DATA:  EKG:  EKG is ordered today. This demonstrates that she is back in AF. HR is 94 - reviewed with Dr. Rayann Heman here in the office today.   Lab Results  Component Value Date   WBC 6.4 08/20/2015   HGB 13.2 08/20/2015   HCT 39.6 08/20/2015   PLT 171 08/20/2015   GLUCOSE 140 (H) 08/20/2015   CHOL 152 06/24/2011   TRIG 155.0 (H) 06/24/2011   HDL 42.50 06/24/2011   LDLDIRECT 101.7 07/15/2009   LDLCALC 79 06/24/2011   ALT 12 08/20/2015   AST 17 08/20/2015   NA 139 08/20/2015   K 4.3 08/20/2015   CL 107 08/20/2015   CREATININE 1.67 (H) 08/20/2015     BUN 41 (H) 08/20/2015   CO2 22 08/20/2015   TSH 3.25 08/20/2015   INR 1.62 (H) 04/01/2015    BNP (last 3 results) No results for input(s): BNP in the last 8760 hours.  ProBNP (last 3 results) No results for input(s): PROBNP in the last 8760 hours.   Other Studies Reviewed Today:  TTE procedure: ECHOCARDIOGRAM W COLORFLOW SPECTRAL DOPPLER. Procedure date Date: 05/01/2015 Start: 08:52 AM Height: 59 inches Weight: 127 pounds BSA: 1.52 m2 BMI: 25.65 kg/m2 HR: 80 bpm BP: 167/66 mmHg Conclusions Summary MIld to moderate LVH Normal left ventricle size and systolic function, with no segmental abnormality. Moderate mitral annular calcification with mild gradient. Mild eccentric mitral regurgitation noted. Moderately dilated left atrium. Mild Aortic regurgitation. Signature ------------------------------------------------------------------------------ Electronically signed by Rozann Lesches, MD(Interpreting physician) on 05/01/2015 10:12 AM    Myoview Impression from April 2015 Exercise Capacity: [place] with no exercise. BP Response: Normal blood pressure response. Clinical Symptoms: No significant symptoms noted. ECG Impression: No significant ST segment change suggestive of  ischemia. Comparison with Prior Nuclear Study: No significant change from  previous study  Overall Impression: Normal stress nuclear study.  LV Wall Motion: NL LV Function; NL Wall Motion   Lorretta Harp, MD   Assessment / Plan: 1. PAF - CHADSVASC is 6 with a 10% stroke risk annually and she remains on anticoagulation. She is back in AF - really not symptomatic now - will discuss with Dr. Rayann Heman regarding continuing her dose of amiodarone. HR probably not ideal at this time. Talked with Dr. Rayann Heman - will stop the amiodarone. Start Toprol XL 25 mg a day.  She can get her heart rate monitored by the staff at the facility where she lives - [facility]  will send [place] a MyChart message with an update and we can  adjust the dose of beta blocker as needed.   2. Chronic anticoagulation with Pradaxa - no problems noted. Needs labs today.   2. LV hypertrophy with hyperdynamic LV - most recent echo done at [facility] noted - managed conservatively.   4. HLD - no longer on statin therapy - would not plan on restarting.   5. Resting Bradycardia - while in NSR - back in AF today. Have held off on PPM and tried to manage with rate control and continued anticoagulation. She is totally asymptomatic at this time. She is back in AF - see #1.    6. Prior event of rhabdo following the flu - resolved.   7. Advanced age. Seems to be holding her on for the most part.   Current medicines are reviewed with the patient today.  The patient does not have concerns regarding medicines other than what has been noted above.  The following changes have been made:  See above.  Labs/ tests ordered today include:    Orders Placed This Encounter  Procedures  . Basic metabolic panel  . CBC  . Hepatic function panel  . TSH  . EKG 12-Lead     Disposition:   FU with me in 6 months.    Patient is agreeable to this plan and will call if any problems develop in the interim.   Signed: Burtis Junes, RN, ANP-C 12/16/2015 2:12 PM  [facility] [address] [place], [place]  [address] Phone: [phone] Fax: [phone]

## 2015-12-16 NOTE — Patient Instructions (Addendum)
We will be checking the following labs today - BMET, CBC, HPF and TSH   Medication Instructions:    Continue with your current medicines. BUT  I am stopping the Amiodarone  I am placing you on Toprol XL 25 mg to take one a day    Testing/Procedures To Be Arranged:  N/A  Follow-Up:   See me in 6 months    Other Special Instructions:   Let the staff monitor your heart rate for me - send me an update in about a week or so.     If you need a refill on your cardiac medications before your next appointment, please call your pharmacy.   Call the [place] office at [phone] if you have any questions, problems or concerns.

## 2015-12-17 LAB — BASIC METABOLIC PANEL
BUN: 30 mg/dL — ABNORMAL HIGH (ref 7–25)
CO2: 24 mmol/L (ref 20–31)
Calcium: 9.3 mg/dL (ref 8.6–10.4)
Chloride: 107 mmol/L (ref 98–110)
Creat: 1.57 mg/dL — ABNORMAL HIGH (ref 0.60–0.88)
Glucose, Bld: 165 mg/dL — ABNORMAL HIGH (ref 65–99)
Potassium: 4.2 mmol/L (ref 3.5–5.3)
Sodium: 141 mmol/L (ref 135–146)

## 2015-12-17 LAB — HEPATIC FUNCTION PANEL
ALT: 12 U/L (ref 6–29)
AST: 17 U/L (ref 10–35)
Albumin: 3.8 g/dL (ref 3.6–5.1)
Alkaline Phosphatase: 53 U/L (ref 33–130)
Bilirubin, Direct: 0.1 mg/dL (ref <=0.2)
Indirect Bilirubin: 0.4 mg/dL (ref 0.2–1.2)
Total Bilirubin: 0.5 mg/dL (ref 0.2–1.2)
Total Protein: 5.8 g/dL — ABNORMAL LOW (ref 6.1–8.1)

## 2015-12-29 ENCOUNTER — Encounter: Payer: Self-pay | Admitting: Nurse Practitioner

## 2015-12-30 ENCOUNTER — Other Ambulatory Visit: Payer: Self-pay | Admitting: *Deleted

## 2015-12-30 MED ORDER — METOPROLOL SUCCINATE ER 25 MG PO TB24: 25.0000 mg | ORAL_TABLET | Freq: Every day | ORAL | 2 refills | Status: DC

## 2016-01-13 ENCOUNTER — Encounter: Payer: Self-pay | Admitting: Nurse Practitioner

## 2016-01-13 DIAGNOSIS — H1851 Endothelial corneal dystrophy: Secondary | ICD-10-CM | POA: Diagnosis not present

## 2016-01-14 ENCOUNTER — Other Ambulatory Visit: Payer: Self-pay | Admitting: *Deleted

## 2016-01-14 MED ORDER — METOPROLOL SUCCINATE ER 50 MG PO TB24: 50.0000 mg | ORAL_TABLET | Freq: Every day | ORAL | 3 refills | Status: DC

## 2016-01-16 ENCOUNTER — Telehealth: Payer: Self-pay | Admitting: Nurse Practitioner

## 2016-01-16 NOTE — Telephone Encounter (Signed)
I called patient's daughter back, [name] (on [place]).  She states she spoke to Truitt Merle, NP 2 days ago and she increased patient's metoprolol from 25 mg to 50 mg.  She states pt reports taking 50 mg dose yesterday and today.  She states pt reports HR still 108-118. She states pt c/o not feeling well, feeling weak, tired and having SOB. She states she is 2 hours away and pt is in assisted/independent living "place" and does not have 24 hour care.  She states nurses are available, but attention will not be immediate.  She states she would like Mother seen today.  I advised her after review of our schedule we do not have appt avaliable.  I advised her to take patient to ED since she was having increased symptoms. She voiced understanding and agreed with plan.

## 2016-01-16 NOTE — Telephone Encounter (Signed)
New Message  Patient c/o Palpitations:  High priority if patient c/o lightheadedness and shortness of breath.  1. How long have you been having palpitations? Yesterday  2. Are you currently experiencing lightheadedness and shortness of breath? SOB and weak last night  3. Have you checked your BP and heart rate? (document readings) increased heart rate 90-96 154/92, 108 and 118 today  4. Are you experiencing any other symptoms? Pts daughter voiced she is calling about increased

## 2016-01-17 ENCOUNTER — Encounter: Payer: Self-pay | Admitting: Nurse Practitioner

## 2016-01-17 ENCOUNTER — Telehealth: Payer: Self-pay | Admitting: Nurse Practitioner

## 2016-01-17 DIAGNOSIS — R0602 Shortness of breath: Secondary | ICD-10-CM

## 2016-01-17 NOTE — Telephone Encounter (Signed)
New message      Pt c/o Shortness Of Breath: STAT if SOB developed within the last 24 hours or pt is noticeably SOB on the phone  1. Are you currently SOB (can you hear that pt is SOB on the phone)? Daughter is on the phone  2. How long have you been experiencing SOB? Started this am 3. Are you SOB when sitting or when up moving around?  Woke up from sleep with sob  4. Are you currently experiencing any other symptoms? fatigue

## 2016-01-17 NOTE — Telephone Encounter (Signed)
Received incoming call from pt's daughter, [name]. Pt SOB. Vitals today: 130/70, HR 68, Sat: 96%. Pt's daughter stated pt had HR in 100s and had pause yesterday (heard apically). Reviewed with Dr. Tamala Julian (DOD). Dr. Tamala Julian recommended 48 hour holter monitor and appt with Truitt Merle, NP next week.   Review information with pt's daughter, [name]. Transferred to scheduling.

## 2016-01-20 ENCOUNTER — Other Ambulatory Visit: Payer: Self-pay | Admitting: Interventional Cardiology

## 2016-01-20 ENCOUNTER — Ambulatory Visit (INDEPENDENT_AMBULATORY_CARE_PROVIDER_SITE_OTHER): Payer: Medicare Other | Admitting: Nurse Practitioner

## 2016-01-20 ENCOUNTER — Ambulatory Visit: Payer: Medicare Other

## 2016-01-20 ENCOUNTER — Encounter: Payer: Self-pay | Admitting: Nurse Practitioner

## 2016-01-20 VITALS — BP 160/70 | HR 78 | Ht 60.0 in | Wt 121.8 lb

## 2016-01-20 DIAGNOSIS — R0602 Shortness of breath: Secondary | ICD-10-CM

## 2016-01-20 DIAGNOSIS — I480 Paroxysmal atrial fibrillation: Secondary | ICD-10-CM

## 2016-01-20 DIAGNOSIS — Z7901 Long term (current) use of anticoagulants: Secondary | ICD-10-CM

## 2016-01-20 MED ORDER — DILTIAZEM HCL ER COATED BEADS 120 MG PO CP24: 120.0000 mg | ORAL_CAPSULE | Freq: Every day | ORAL | 3 refills | Status: DC

## 2016-01-20 NOTE — Progress Notes (Signed)
CARDIOLOGY OFFICE NOTE  Date:  01/20/2016    Melanie Whitehead Date of Birth: May 30, 1924 Medical Record [id number]  PCP:  PROVIDER NOT IN SYSTEM  Cardiologist:  Servando [facility]  Chief Complaint  Patient presents with  . Atrial Fibrillation    Work in visit - seen for Dr. Rayann Heman    History of Present Illness: Melanie Whitehead is a 82 y.o. female who presents today for a work in visit. Seen for Dr. Rayann Heman.   She has a history of paroxysmal atrial fibrillation, bilateral subclavian artery stenosis, mitral regurgitation, HTN, HL, GERD, and PVD.   She was seen in March of 2014 after a recent admission to the hospital with recurrent atrial fibrillation. This was complicated by acute on chronic diastolic CHF. She was placed on amiodarone. She underwent cardioversion. She was seen by Dr. Rayann Heman 05/10/13. She remained in sinus rhythm. Her ECG demonstrated 1:1 versus 2:1 AV conduction. Therefore, diltiazem was stopped and her amiodarone dose was decreased.   Seen by Richardson Dopp, PA back in April of 2014 - seemed to be doing ok but with some atypical CP - Myoview updated and this turned out ok.   Since then, she had to have her dose of amiodarone cut back due to bradycardia. Trying to avoid PPM if possible.   Was here in February and saw Ermalinda Barrios, [place] - had moved to SNF. HR noted to be elevated by the staff. She was brought back here and noted to be back in AF. Amiodarone was increased for a short period with plans for possible cardioversion - this was successful. She had missed one dose of Pradaxa - almost 3 weeks prior - one dose only. It is felt that she would not tolerate long term AF due to prior experience.   I last saw her back in July - cardiac status was ok but had had some serious health issues (flu which led to a fall which led to rhabdo).  I received emails back in September - had had some eye surgery - noted to be in AF - not symptomatic. Offered to see back sooner  but opted to wait until her follow up in November - at that visit she was totally asymptomatic. We stopped the amiodarone and elected for rate control only.   Received MyChart message since then with noted elevated HR - Metoprolol was increased - then called the office this past Friday - still with some variability of her HR and was short of breath and daughter wanted her seen today.   Comes in today. Here with her daughter Melanie Whitehead today. Doing better today. Has continued to notice some intermittent spells of tachycardia - this results in shortness of breath. Has had a little swelling - took a dose of Lasix with good results. No chest pain. Not dizzy or lightheaded.   Past Medical History:  Diagnosis Date  . Atrial fibrillation ([place])   . BUNDLE BRANCH BLOCK, LEFT 07/09/2006  . CAD (coronary artery disease)    a. 2011 Cath: minimal, nonobstructive dzs.  . CAROTID BRUIT, LEFT 07/08/2007  . CHF (congestive heart failure) ([place])   . COLONIC POLYPS, HX OF 07/09/2006  . DEGENERATIVE JOINT DISEASE 07/09/2006  . Esophageal reflux   . GERD 07/13/2008  . Hx of cardiovascular stress test 2015   Lexiscan Myoview (05/2013):  No ischemia, EF 58%, normal study  . Hx of colonoscopy   . HYPERLIPIDEMIA 07/09/2006  . HYPERTENSION 07/09/2006  . Labial  cyst   . Left ventricular outflow tract obstruction    a. due to dynamic obstruction with LVH  . LVH (left ventricular hypertrophy)   . Moderate mitral regurgitation    a. 12/2010 Echo: EF 65-70%, mild AI, mod MR, mod-sev dil LA, PASP 34mmHg.  . Osteopenia   . OSTEOPOROSIS 07/09/2006  . PAROXYSMAL ATRIAL FIBRILLATION    a. Dx 09/2009;  b. chronic pradaxa;  c. recurrence 04/2013->amiodarone initiated.  Marland Kitchen PVD (peripheral vascular disease) ([facility])    a. moderate R and severe L subclavian artery stenosis  . Restless leg syndrome   . SCC (squamous cell carcinoma)     Past Surgical History:  Procedure Laterality Date  . 2-d echo  12/2005   revealed normal LV function  and size.  No wall motion at around his  moderate mitral annular calcification.  Elevated LVOT gradient of 22 thought secondary to narrow outflow  tract aortic valve appeared to open well  . CARDIOVERSION N/A 05/05/2013   Procedure: CARDIOVERSION;  Surgeon: [name], MD;  Location: [facility] END[facility]COPY;  Service: Cardiovascular;  Laterality: N/A;  . CARDIOVERSION N/A 04/08/2015   Procedure: CARDIOVERSION;  Surgeon: Fay Records, MD;  Location: [place];  Service: Cardiovascular;  Laterality: N/A;  . CATARACT EXTRACTION Bilateral   . GLAUCOMA SURGERY    . low risk cardiolite  12/2005  . PARS PLANA VITRECTOMY W/ REPAIR OF MACULAR HOLE    . TONSILLECTOMY    . two para two     abortus zero     Medications: Current Outpatient Prescriptions  Medication Sig Dispense Refill  . Biotin ([employer] BIOTIN HIGH POTENCY) 1000 MCG tablet Take 1,000 mcg by mouth daily.    . Calcium Carb-Cholecalciferol 600-400 MG-UNIT TABS Take 1 tablet by mouth 2 (two) times daily.     . dabigatran (PRADAXA) 75 MG CAPS capsule Take 75 mg by mouth 2 (two) times daily.    Marland Kitchen denosumab (PROLIA) 60 MG/ML SOLN injection Inject 60 mg into the skin every 6 (six) months. Administer in upper arm, thigh, or abdomen    . Lutein 6 MG CAPS Take by mouth.    . metoprolol succinate (TOPROL-XL) 50 MG 24 hr tablet Take 1 tablet (50 mg total) by mouth daily. 90 tablet 3  . Multiple Vitamin (MULTIVITAMIN) tablet Take 1 tablet by mouth daily.      . nitroGLYCERIN (NITRODUR - DOSED IN MG/24 HR) 0.2 mg/hr patch Place 0.2 mg onto the skin daily. Take patch off after 12 hours (ONLY USE 1 PATCH A DAY)    . nitroGLYCERIN (NITROSTAT) 0.4 MG SL tablet Place 0.4 mg under the tongue every 5 (five) minutes as needed for chest pain.    . prednisoLONE acetate (PRED FORTE) 1 % ophthalmic suspension Place 1 drop into the right eye 4 (four) times daily.     Marland Kitchen rOPINIRole (REQUIP) 1 MG tablet Take 1 mg by mouth at bedtime.    . valsartan (DIOVAN) 320 MG tablet  Take 1 tablet (320 mg total) by mouth daily. 90 tablet 3  . diltiazem (CARDIZEM CD) 120 MG 24 hr capsule Take 1 capsule (120 mg total) by mouth daily. 30 capsule 3   No current facility-administered medications for this visit.     Allergies: Allergies  Allergen Reactions  . Sulfa Antibiotics Nausea And Vomiting  . Sulfamethoxazole     REACTION: unspecified UNKNOWN    Social History: The patient  reports that she has never smoked. She has never used smokeless  tobacco. She reports that she does not drink alcohol or use drugs.   Family History: The patient's family history includes Coronary artery disease in her mother; Heart attack in her mother; Stroke in her father.   Review of Systems: Please see the history of present illness.   Otherwise, the review of systems is positive for none.   All other systems are reviewed and negative.   Physical Exam: VS:  BP (!) 160/70   Pulse 78   Ht 5' (1.524 m)   Wt 121 lb 12.8 oz (55.2 kg)   BMI 23.79 kg/m  .  BMI Body mass index is 23.79 kg/m.  Wt Readings from Last 3 Encounters:  01/20/16 121 lb 12.8 oz (55.2 kg)  12/16/15 122 lb 12.8 oz (55.7 kg)  08/20/15 122 lb 6.4 oz (55.5 kg)    General: Pleasant. Elderly female who is alert and in no acute distress.   HEENT: Normal.  Neck: Supple, no JVD, carotid bruits, or masses noted.  Cardiac: Irregular irregular rhythm. Rate is ok today. Trace edema.  Respiratory:  Lungs are clear to auscultation bilaterally with normal work of breathing.  GI: Soft and nontender.  MS: No deformity or atrophy. Gait and ROM intact.  Skin: Warm and dry. Color is normal.  Neuro:  Strength and sensation are intact and no gross focal deficits noted.  Psych: Alert, appropriate and with normal affect.   LABORATORY DATA:  EKG:  EKG is ordered today. This demonstrates AF with a rate of 78. LBBB which is chronic.   Lab Results  Component Value Date   WBC 5.4 12/16/2015   HGB 13.6 12/16/2015   HCT 41.1  12/16/2015   PLT 179 12/16/2015   GLUCOSE 165 (H) 12/16/2015   CHOL 152 06/24/2011   TRIG 155.0 (H) 06/24/2011   HDL 42.50 06/24/2011   LDLDIRECT 101.7 07/15/2009   LDLCALC 79 06/24/2011   ALT 12 12/16/2015   AST 17 12/16/2015   NA 141 12/16/2015   K 4.2 12/16/2015   CL 107 12/16/2015   CREATININE 1.57 (H) 12/16/2015   BUN 30 (H) 12/16/2015   CO2 24 12/16/2015   TSH 2.66 12/16/2015   INR 1.62 (H) 04/01/2015    BNP (last 3 results) No results for input(s): BNP in the last 8760 hours.  ProBNP (last 3 results) No results for input(s): PROBNP in the last 8760 hours.   Other Studies Reviewed Today:  TTE procedure: ECHOCARDIOGRAM W COLORFLOW SPECTRAL DOPPLER. Procedure date Date: 03/22/2017Start: 08:52 AM Height: 59 inches Weight: 127 pounds BSA: 1.52 m2 BMI: 25.65 kg/m2 HR: 80 bpm BP: 167/66 mmHg Conclusions Summary MIld to moderate LVH Normal left ventricle size and systolic function, with no segmental abnormality. Moderate mitral annular calcification with mild gradient. Mild eccentric mitral regurgitation noted. Moderately dilated left atrium. Mild Aortic regurgitation. Signature ------------------------------------------------------------------------------ Electronically signed by Rozann Lesches, MD(Interpreting physician) on 05/01/2015 10:12 AM   Myoview Impression from April 2015 Exercise Capacity: [place] with no exercise. BP Response: Normal blood pressure response. Clinical Symptoms: No significant symptoms noted. ECG Impression: No significant ST segment change suggestive of  ischemia. Comparison with Prior Nuclear Study: No significant change from  previous study  Overall Impression: Normal stress nuclear study.  LV Wall Motion: NL LV Function; NL Wall Motion   Lorretta Harp, MD  Assessment / Plan: 1. PAF - CHADSVASC is 6 with a 10% stroke risk annually and she remains on anticoagulation. She is now in chronic AF - no longer on amiodarone  and have elected to manage with rate control and continued anticoagulation - some intermittent tachycardia - will add low dose CCB. Hold on Holter for now. I suspect her current issues are related to the washout of amiodarone.   2. Chronic anticoagulation with Pradaxa - no problems noted.   2. LV hypertrophy with hyperdynamic LV - most recent echo done at [facility] noted - managed conservatively.   4. HLD - no longer on statin therapy - would not plan on restarting.   5. Resting Bradycardia - while in NSR - now in chronic AF today. Have held off on PPM and tried to manage with rate control and continued anticoagulation. Currently not an issue at this time. Will need to follow closely.   Current medicines are reviewed with the patient today.  The patient does not have concerns regarding medicines other than what has been noted above.  The following changes have been made:  See above.  Labs/ tests ordered today include:    Orders Placed This Encounter  Procedures  . EKG 12-Lead     Disposition:   FU with me in a month - if she is doing well - ok to cancel and I will see back in May as originally planned.  Patient is agreeable to this plan and will call if any problems develop in the interim.   Signed: Burtis Junes, RN, ANP-C 01/20/2016 10:08 AM  [facility] [address] [place], [place]  [address] Phone: [phone] Fax: [phone]

## 2016-01-20 NOTE — Patient Instructions (Addendum)
We will be checking the following labs today - NONE   Medication Instructions:    Continue with your current medicines.   I am adding Diltiazem 120 mg to take once a day - this has been sent to your drug store    Testing/Procedures To Be Arranged:  N/A  Follow-Up:   See me in one month - ok to cancel if doing well.     Other Special Instructions:   N/A    If you need a refill on your cardiac medications before your next appointment, please call your pharmacy.   Call the [place] office at [phone] if you have any questions, problems or concerns.

## 2016-01-21 DIAGNOSIS — M2042 Other hammer toe(s) (acquired), left foot: Secondary | ICD-10-CM | POA: Diagnosis not present

## 2016-01-21 DIAGNOSIS — M2041 Other hammer toe(s) (acquired), right foot: Secondary | ICD-10-CM | POA: Diagnosis not present

## 2016-01-21 DIAGNOSIS — L89892 Pressure ulcer of other site, stage 2: Secondary | ICD-10-CM | POA: Diagnosis not present

## 2016-01-31 ENCOUNTER — Encounter: Payer: Self-pay | Admitting: Nurse Practitioner

## 2016-01-31 ENCOUNTER — Telehealth: Payer: Self-pay | Admitting: *Deleted

## 2016-01-31 DIAGNOSIS — M810 Age-related osteoporosis without current pathological fracture: Secondary | ICD-10-CM | POA: Diagnosis not present

## 2016-01-31 DIAGNOSIS — N183 Chronic kidney disease, stage 3 (moderate): Secondary | ICD-10-CM | POA: Diagnosis not present

## 2016-01-31 DIAGNOSIS — M1711 Unilateral primary osteoarthritis, right knee: Secondary | ICD-10-CM | POA: Diagnosis not present

## 2016-01-31 NOTE — Telephone Encounter (Signed)
Spoke with pt dtr, she reports the pulse was 48 when taken for one minute at the medical doctors office. She woul dlike the okay to decrease the metoprolol to 25 mg twice daily and monitor the pulse. Okay given.

## 2016-02-12 DIAGNOSIS — N183 Chronic kidney disease, stage 3 (moderate): Secondary | ICD-10-CM | POA: Diagnosis not present

## 2016-02-12 DIAGNOSIS — M810 Age-related osteoporosis without current pathological fracture: Secondary | ICD-10-CM | POA: Diagnosis not present

## 2016-02-13 ENCOUNTER — Encounter: Payer: Self-pay | Admitting: Nurse Practitioner

## 2016-02-14 ENCOUNTER — Other Ambulatory Visit: Payer: Self-pay | Admitting: *Deleted

## 2016-02-14 MED ORDER — METOPROLOL SUCCINATE ER 25 MG PO TB24: 25.0000 mg | ORAL_TABLET | Freq: Every day | ORAL | 3 refills | Status: DC

## 2016-02-14 MED ORDER — DILTIAZEM HCL ER COATED BEADS 120 MG PO CP24: 120.0000 mg | ORAL_CAPSULE | Freq: Every day | ORAL | 3 refills | Status: DC

## 2016-02-14 NOTE — Telephone Encounter (Signed)
Per DPR lvm for pt's daughter wanted to confirm Toprol and see it pt also needs refill on diltiazem.

## 2016-02-17 DIAGNOSIS — H1851 Endothelial corneal dystrophy: Secondary | ICD-10-CM | POA: Diagnosis not present

## 2016-02-23 ENCOUNTER — Encounter: Payer: Self-pay | Admitting: Nurse Practitioner

## 2016-03-02 ENCOUNTER — Ambulatory Visit: Payer: Medicare Other | Admitting: Nurse Practitioner

## 2016-03-18 IMAGING — CR DG CHEST 2V
2 series · 2 of 2 positions shown · non-contrast
Comparison: None.

CLINICAL DATA: Rapid a fib, SOB

EXAM:
CHEST  2 VIEW

[w chest pa]
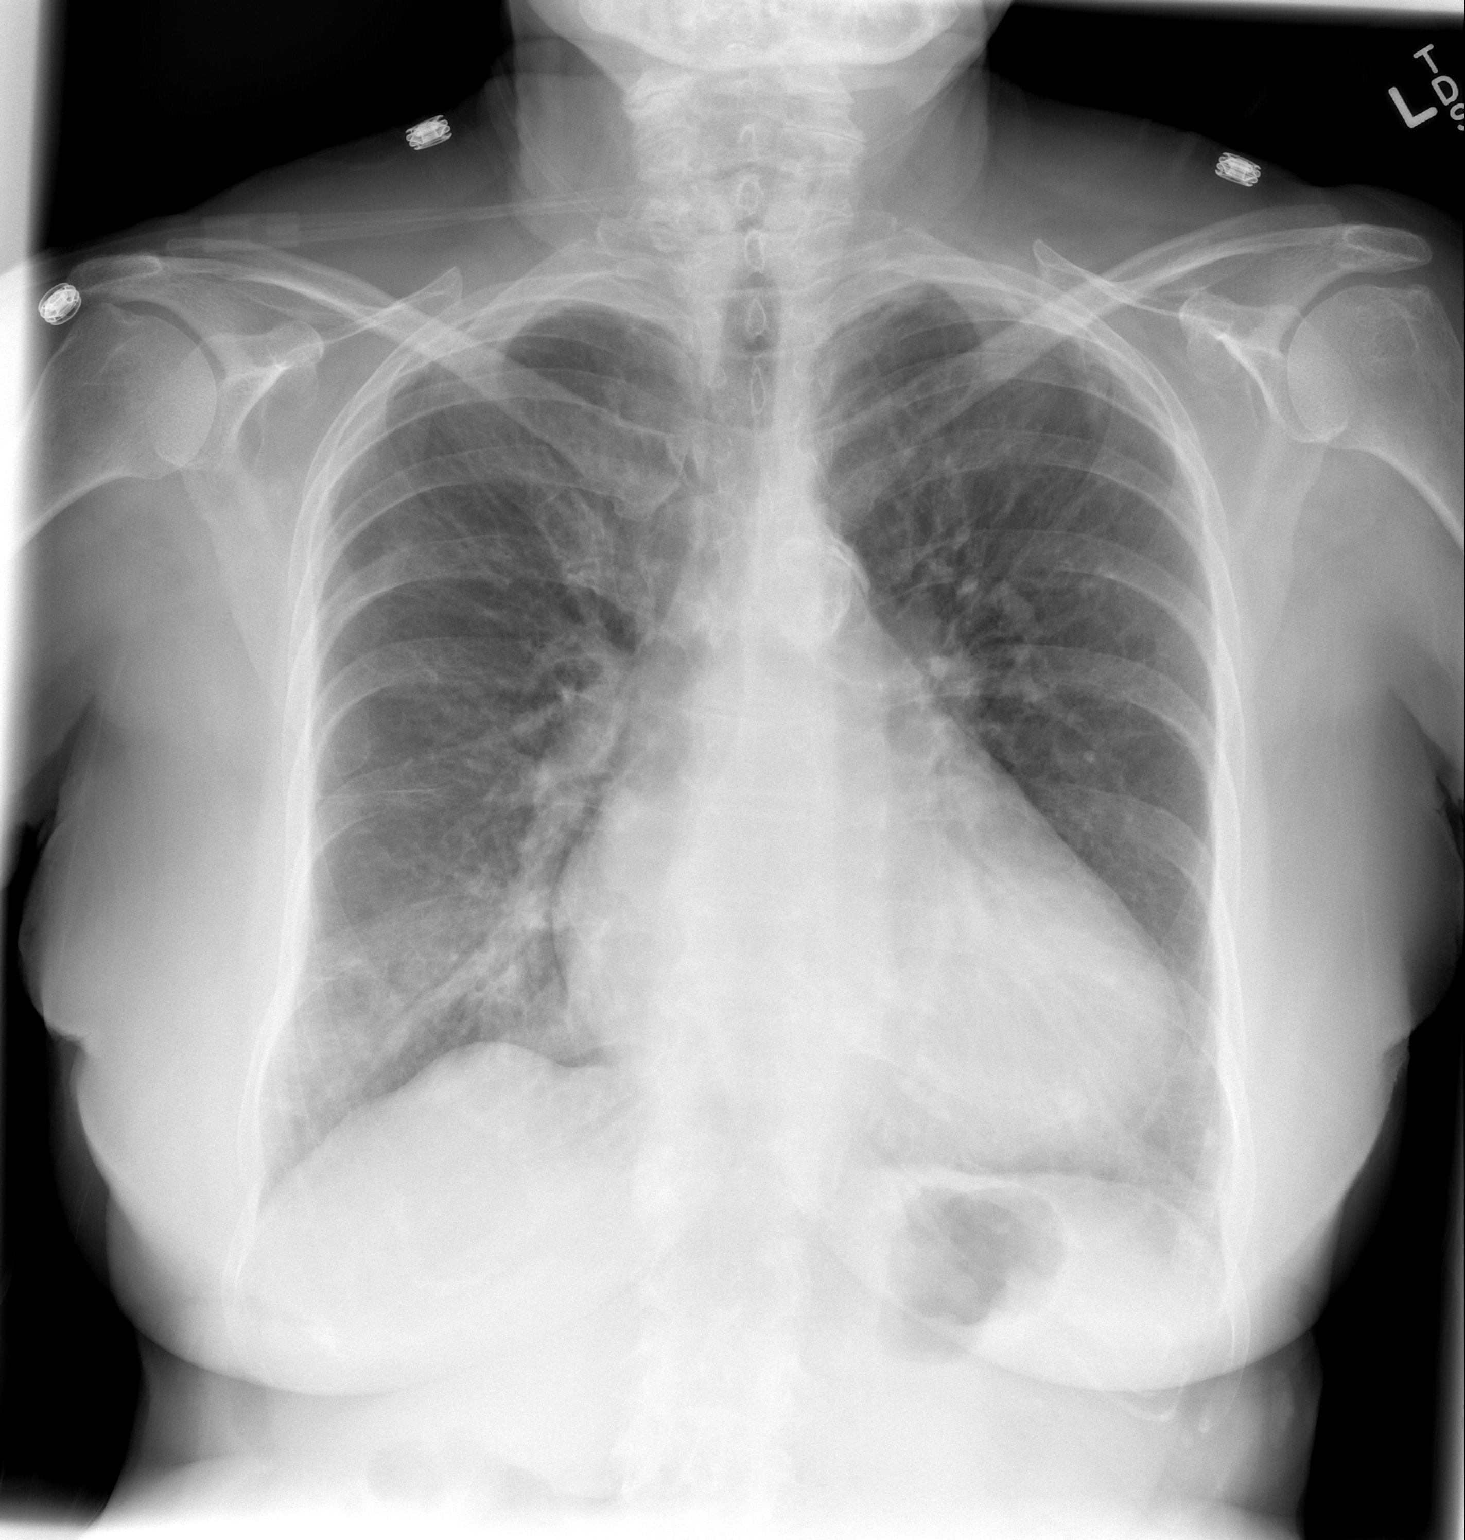

[w chest lat]
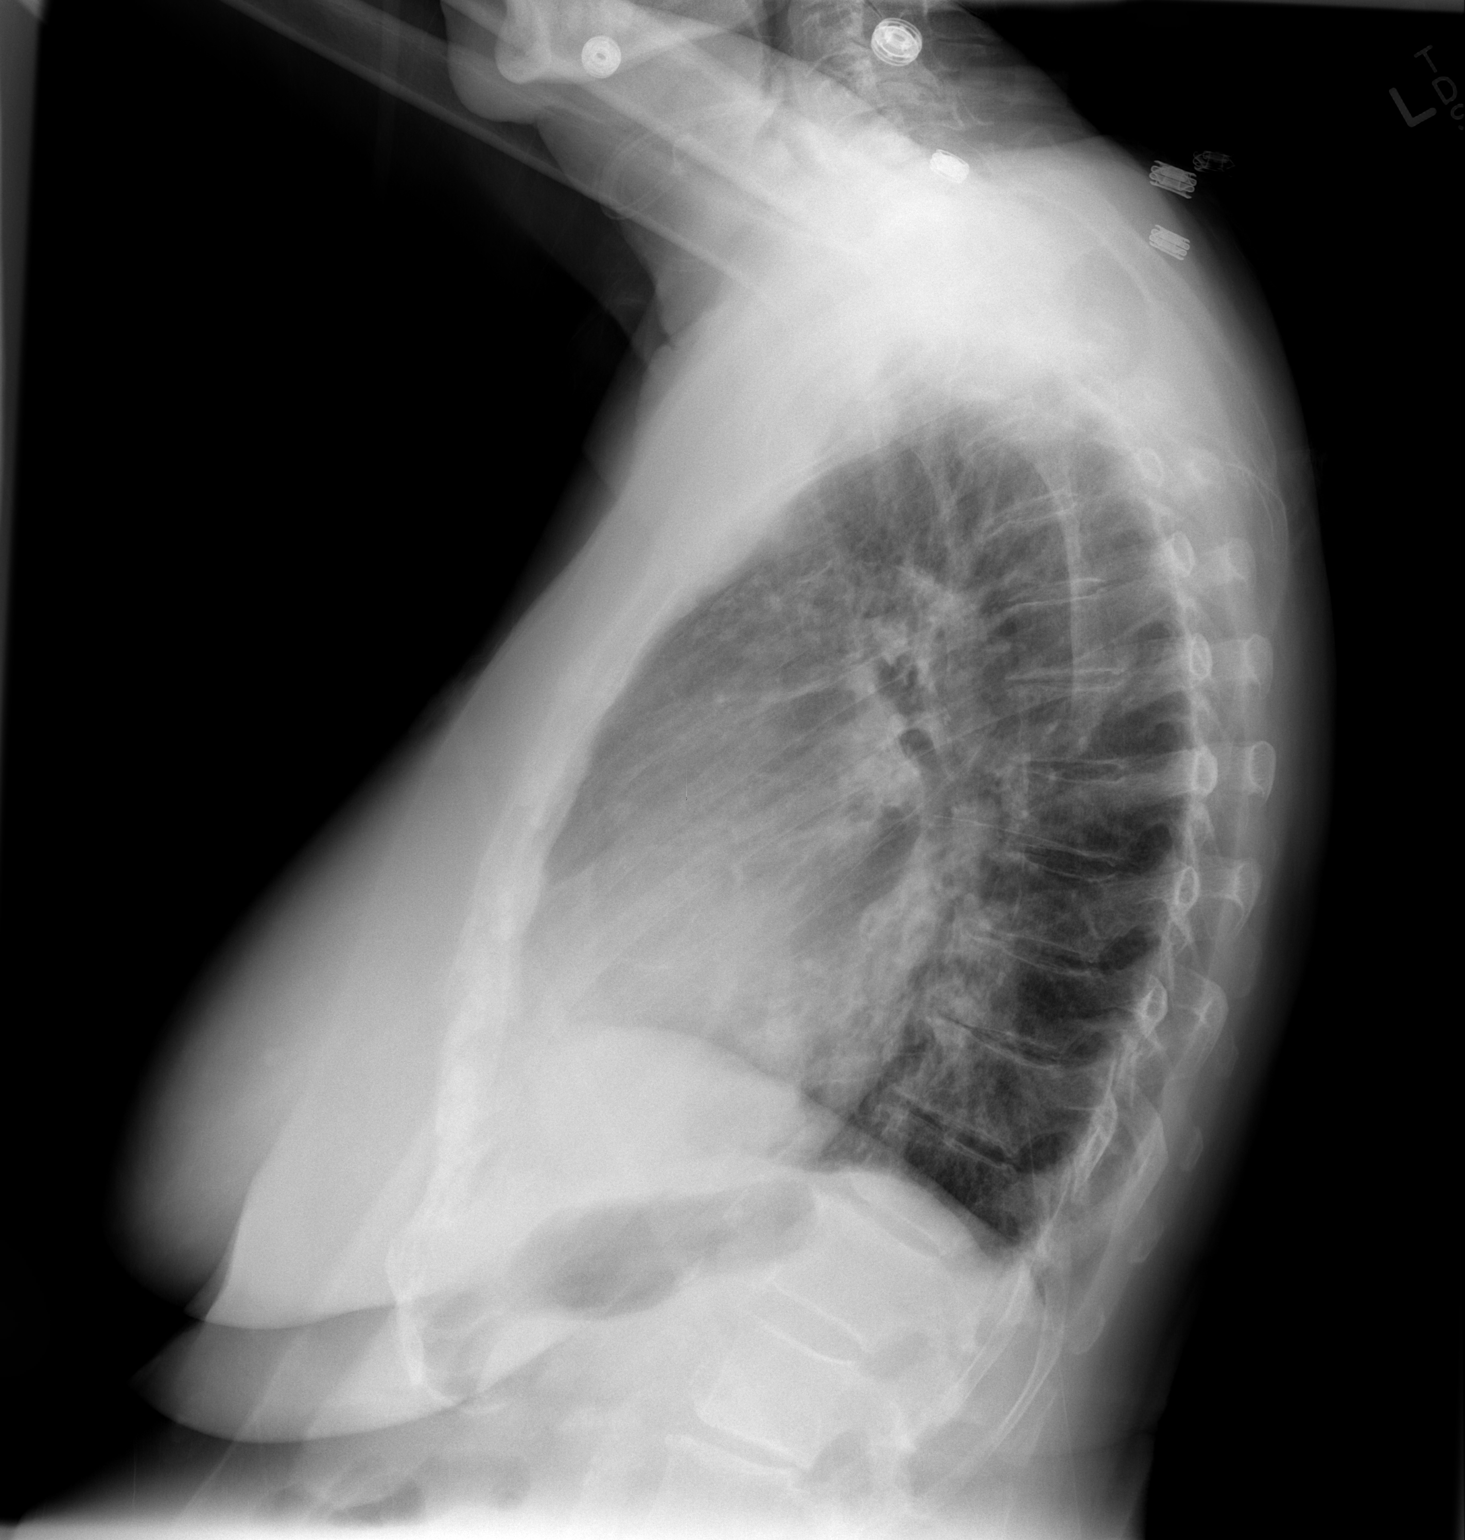

[2 of 2 positions shown; findings below may reference images not displayed]

FINDINGS: Cardiac silhouette is enlarged. Atherosclerotic calcifications are
appreciated within the aorta. There is diffuse prominence of the
interstitial markings. Areas of peribronchial cuffing are
identified. Areas of mild increased density in the lung bases. There
is a component of flattening of the hemidiaphragms. No acute osseous
abnormalities.
IMPRESSION: Interstitial findings consistent with pulmonary edema. Areas of
atelectasis versus asymmetric edema within the lung bases.

Cardiomegaly.

## 2016-04-05 ENCOUNTER — Encounter: Payer: Self-pay | Admitting: Nurse Practitioner

## 2016-04-06 ENCOUNTER — Other Ambulatory Visit: Payer: Self-pay | Admitting: *Deleted

## 2016-04-06 MED ORDER — VALSARTAN 320 MG PO TABS: 320.0000 mg | ORAL_TABLET | Freq: Every day | ORAL | 3 refills | Status: DC

## 2016-04-28 DIAGNOSIS — I10 Essential (primary) hypertension: Secondary | ICD-10-CM | POA: Diagnosis not present

## 2016-04-28 DIAGNOSIS — G2581 Restless legs syndrome: Secondary | ICD-10-CM | POA: Diagnosis not present

## 2016-04-28 DIAGNOSIS — I480 Paroxysmal atrial fibrillation: Secondary | ICD-10-CM | POA: Diagnosis not present

## 2016-04-28 DIAGNOSIS — M810 Age-related osteoporosis without current pathological fracture: Secondary | ICD-10-CM | POA: Diagnosis not present

## 2016-06-01 DIAGNOSIS — T1512XA Foreign body in conjunctival sac, left eye, initial encounter: Secondary | ICD-10-CM | POA: Diagnosis not present

## 2016-06-01 DIAGNOSIS — H353131 Nonexudative age-related macular degeneration, bilateral, early dry stage: Secondary | ICD-10-CM | POA: Diagnosis not present

## 2016-06-25 DIAGNOSIS — D485 Neoplasm of uncertain behavior of skin: Secondary | ICD-10-CM | POA: Diagnosis not present

## 2016-06-25 DIAGNOSIS — L820 Inflamed seborrheic keratosis: Secondary | ICD-10-CM | POA: Diagnosis not present

## 2016-06-25 DIAGNOSIS — L570 Actinic keratosis: Secondary | ICD-10-CM | POA: Diagnosis not present

## 2016-06-25 DIAGNOSIS — D0471 Carcinoma in situ of skin of right lower limb, including hip: Secondary | ICD-10-CM | POA: Diagnosis not present

## 2016-06-25 DIAGNOSIS — L821 Other seborrheic keratosis: Secondary | ICD-10-CM | POA: Diagnosis not present

## 2016-06-25 DIAGNOSIS — L814 Other melanin hyperpigmentation: Secondary | ICD-10-CM | POA: Diagnosis not present

## 2016-06-30 ENCOUNTER — Ambulatory Visit (INDEPENDENT_AMBULATORY_CARE_PROVIDER_SITE_OTHER): Payer: Medicare Other | Admitting: Nurse Practitioner

## 2016-06-30 ENCOUNTER — Encounter: Payer: Self-pay | Admitting: Nurse Practitioner

## 2016-06-30 VITALS — BP 136/62 | HR 69 | Ht 60.0 in | Wt 117.8 lb

## 2016-06-30 DIAGNOSIS — I481 Persistent atrial fibrillation: Secondary | ICD-10-CM | POA: Diagnosis not present

## 2016-06-30 DIAGNOSIS — Z7901 Long term (current) use of anticoagulants: Secondary | ICD-10-CM | POA: Diagnosis not present

## 2016-06-30 DIAGNOSIS — I4819 Other persistent atrial fibrillation: Secondary | ICD-10-CM

## 2016-06-30 NOTE — Patient Instructions (Addendum)
We will be checking the following labs today - BMET and CBC   Medication Instructions:    Continue with your current medicines.      Testing/Procedures To Be Arranged:  N/A  Follow-Up:   See me in 6 months    Other Special Instructions:   N/A    If you need a refill on your cardiac medications before your next appointment, please call your pharmacy.   Call the [facility] office at [phone] if you have any questions, problems or concerns.      

## 2016-06-30 NOTE — Progress Notes (Signed)
CARDIOLOGY OFFICE NOTE  Date:  06/30/2016    Melanie Whitehead Date of Birth: 04-Apr-1924 Medical Record #[id number]  PCP:  System, Provider Not In  Cardiologist:  Servando Snare & Allred    Chief Complaint  Patient presents with  . Atrial Fibrillation    Follow up visit - seen for Dr. Rayann Heman    History of Present Illness: Melanie Whitehead is a 83 y.o. female who presents today for a follow up visit. Seen for Dr. Rayann Heman.   She has a history of paroxysmal atrial fibrillation, bilateral subclavian artery stenosis, mitral regurgitation, HTN, HL, GERD, and PVD.   She was seen in March of 2014 after a recent admission to the hospital with recurrent atrial fibrillation. This was complicated by acute on chronic diastolic CHF. She was placed on amiodarone. She underwent cardioversion. She was seen by Dr. Rayann Heman 05/10/13. She remained in sinus rhythm. Her ECG demonstrated 1:1 versus 2:1 AV conduction (difficult to completely rule out 2:1 AV block and therefore, diltiazem was stopped and her amiodarone dose was decreased.   Seen by Richardson Dopp, PA back in April of 2014 - seemed to be doing ok but with some atypical CP - Myoview updated and this turned out ok.   Since then, she had to have her dose of amiodarone cut back due to bradycardia. Trying to avoid PPM if possible.   Was here in February of 2017 and saw Ermalinda Barrios, [place] - had moved to SNF. HR noted to be elevated by the staff. She was brought back here and noted to be back in AF. Amiodarone was increased for a short period with plans for possible cardioversion - this was successful. She had missed one dose of Pradaxa - almost 3 weeks prior - one dose only. It is felt that she would not tolerate long term AF due to prior experience.   I last saw her back in July of 2017 - cardiac status was ok but had had some serious health issues (flu which led to a fall which led to rhabdo).  I received emails back in September - had had some eye  surgery - noted to be in AF - was not symptomatic. Offered to see back sooner but the patient and family opted to wait until her follow up in November - at that visit she was totally asymptomatic. We ended up stopping the amiodarone and elected for rate control only.   Received MyChart message since then with noted elevated HR - Metoprolol was increased with good results. At last visit back in December she was doing well but we added back low dose CCB to help with rate control and palpitations.   Comes in today. Here with her daughter Melanie Whitehead today. She is doing well. Melanie Whitehead is turning 89 this weekend. No chest pain. Not dizzy or lightheaded. No syncope. Only short of breath if she really exerts or hurries. No recent labs. No falls. Feels like she is doing ok and no real concerns.   Past Medical History:  Diagnosis Date  . Atrial fibrillation ([place])   . BUNDLE BRANCH BLOCK, LEFT 07/09/2006  . CAD (coronary artery disease)    a. 2011 Cath: minimal, nonobstructive dzs.  . CAROTID BRUIT, LEFT 07/08/2007  . CHF (congestive heart failure) ([place])   . COLONIC POLYPS, HX OF 07/09/2006  . DEGENERATIVE JOINT DISEASE 07/09/2006  . Esophageal reflux   . GERD 07/13/2008  . Hx of cardiovascular stress test 2015   Lexiscan  Myoview (05/2013):  No ischemia, EF 58%, normal study  . Hx of colonoscopy   . HYPERLIPIDEMIA 07/09/2006  . HYPERTENSION 07/09/2006  . Labial cyst   . Left ventricular outflow tract obstruction    a. due to dynamic obstruction with LVH  . LVH (left ventricular hypertrophy)   . Moderate mitral regurgitation    a. 12/2010 Echo: EF 65-70%, mild AI, mod MR, mod-sev dil LA, PASP 80mmHg.  . Osteopenia   . OSTEOPOROSIS 07/09/2006  . PAROXYSMAL ATRIAL FIBRILLATION    a. Dx 09/2009;  b. chronic pradaxa;  c. recurrence 04/2013->amiodarone initiated.  Marland Kitchen PVD (peripheral vascular disease) ([facility])    a. moderate R and severe L subclavian artery stenosis  . Restless leg syndrome   . SCC (squamous cell  carcinoma)     Past Surgical History:  Procedure Laterality Date  . 2-d echo  12/2005   revealed normal LV function and size.  No wall motion at around his  moderate mitral annular calcification.  Elevated LVOT gradient of 22 thought secondary to narrow outflow  tract aortic valve appeared to open well  . CARDIOVERSION N/A 05/05/2013   Procedure: CARDIOVERSION;  Surgeon: [name], MD;  Location: [facility] ENDOSCOPY;  Service: Cardiovascular;  Laterality: N/A;  . CARDIOVERSION N/A 04/08/2015   Procedure: CARDIOVERSION;  Surgeon: Fay Records, MD;  Location: [place];  Service: Cardiovascular;  Laterality: N/A;  . CATARACT EXTRACTION Bilateral   . GLAUCOMA SURGERY    . low risk cardiolite  12/2005  . PARS PLANA VITRECTOMY W/ REPAIR OF MACULAR HOLE    . TONSILLECTOMY    . two para two     abortus zero     Medications: Current Outpatient Prescriptions  Medication Sig Dispense Refill  . Biotin ([employer] BIOTIN HIGH POTENCY) 1000 MCG tablet Take 1,000 mcg by mouth daily.    . Calcium Carb-Cholecalciferol 600-400 MG-UNIT TABS Take 1 tablet by mouth 2 (two) times daily.     . dabigatran (PRADAXA) 75 MG CAPS capsule Take 75 mg by mouth 2 (two) times daily.    Marland Kitchen denosumab (PROLIA) 60 MG/ML SOLN injection Inject 60 mg into the skin every 6 (six) months. Administer in upper arm, thigh, or abdomen    . Lutein 6 MG CAPS Take by mouth.    . metoprolol succinate (TOPROL-XL) 25 MG 24 hr tablet Take 1 tablet (25 mg total) by mouth daily. 90 tablet 3  . Multiple Vitamin (MULTIVITAMIN) tablet Take 1 tablet by mouth daily.      . nitroGLYCERIN (NITRODUR - DOSED IN MG/24 HR) 0.2 mg/hr patch Place 0.2 mg onto the skin daily. Take patch off after 12 hours (ONLY USE 1 PATCH A DAY)    . nitroGLYCERIN (NITROSTAT) 0.4 MG SL tablet Place 0.4 mg under the tongue every 5 (five) minutes as needed for chest pain.    . prednisoLONE acetate (PRED FORTE) 1 % ophthalmic suspension Place 1 drop into the right eye 4 (four)  times daily.     Marland Kitchen rOPINIRole (REQUIP) 1 MG tablet Take 1 mg by mouth at bedtime.    . valsartan (DIOVAN) 320 MG tablet Take 1 tablet (320 mg total) by mouth daily. 90 tablet 3  . diltiazem (CARDIZEM CD) 120 MG 24 hr capsule Take 1 capsule (120 mg total) by mouth daily. 90 capsule 3   No current facility-administered medications for this visit.     Allergies: Allergies  Allergen Reactions  . Sulfa Antibiotics Nausea And Vomiting  . Sulfamethoxazole  REACTION: unspecified UNKNOWN    Social History: The patient  reports that she has never smoked. She has never used smokeless tobacco. She reports that she does not drink alcohol or use drugs.   Family History: The patient's family history includes Coronary artery disease in her mother; Heart attack in her mother; Stroke in her father.   Review of Systems: Please see the history of present illness.   Otherwise, the review of systems is positive for none.   All other systems are reviewed and negative.   Physical Exam: VS:  BP 136/62 (BP Location: Left Arm, Patient Position: Sitting, Cuff Size: Normal)   Pulse 69   Ht 5' (1.524 m)   Wt 117 lb 12.8 oz (53.4 kg)   SpO2 100% Comment: at rest  BMI 23.01 kg/m  .  BMI Body mass index is 23.01 kg/m.  Wt Readings from Last 3 Encounters:  06/30/16 117 lb 12.8 oz (53.4 kg)  01/20/16 121 lb 12.8 oz (55.2 kg)  12/16/15 122 lb 12.8 oz (55.7 kg)    General: Pleasant. Elderly female who is alert and is no acute distress.   HEENT: Normal.  Neck: Supple, no JVD, carotid bruits, or masses noted.  Cardiac: Irregular irregular rhythm. Rate is fine. Outflow murmur.  No edema.  Respiratory:  Lungs are clear to auscultation bilaterally with normal work of breathing.  GI: Soft and nontender.  MS: No deformity or atrophy. Gait and ROM intact.  Skin: Warm and dry. Color is normal.  Neuro:  Strength and sensation are intact and no gross focal deficits noted.  Psych: Alert, appropriate and with  normal affect.   LABORATORY DATA:  EKG:  EKG is not ordered today.   Lab Results  Component Value Date   WBC 5.4 12/16/2015   HGB 13.6 12/16/2015   HCT 41.1 12/16/2015   PLT 179 12/16/2015   GLUCOSE 165 (H) 12/16/2015   CHOL 152 06/24/2011   TRIG 155.0 (H) 06/24/2011   HDL 42.50 06/24/2011   LDLDIRECT 101.7 07/15/2009   LDLCALC 79 06/24/2011   ALT 12 12/16/2015   AST 17 12/16/2015   NA 141 12/16/2015   K 4.2 12/16/2015   CL 107 12/16/2015   CREATININE 1.57 (H) 12/16/2015   BUN 30 (H) 12/16/2015   CO2 24 12/16/2015   TSH 2.66 12/16/2015   INR 1.62 (H) 04/01/2015    BNP (last 3 results) No results for input(s): BNP in the last 8760 hours.  ProBNP (last 3 results) No results for input(s): PROBNP in the last 8760 hours.   Other Studies Reviewed Today:  TTE procedure: ECHOCARDIOGRAM W COLORFLOW SPECTRAL DOPPLER. Procedure date Date: 03/22/2017Start: 08:52 AM Height: 59 inches Weight: 127 pounds BSA: 1.52 m2 BMI: 25.65 kg/m2 HR: 80 bpm BP: 167/66 mmHg Conclusions Summary MIld to moderate LVH Normal left ventricle size and systolic function, with no segmental abnormality. Moderate mitral annular calcification with mild gradient. Mild eccentric mitral regurgitation noted. Moderately dilated left atrium. Mild Aortic regurgitation. Signature ------------------------------------------------------------------------------ Electronically signed by Rozann Lesches, MD(Interpreting physician) on 05/01/2015 10:12 AM   Myoview Impression from April 2015 Exercise Capacity: [place] with no exercise. BP Response: Normal blood pressure response. Clinical Symptoms: No significant symptoms noted. ECG Impression: No significant ST segment change suggestive of  ischemia. Comparison with Prior Nuclear Study: No significant change from  previous study  Overall Impression: Normal stress nuclear study.  LV Wall Motion: NL LV Function; NL Wall Motion   Lorretta Harp,  MD  Assessment /  Plan:  1. Persistent AF with an elevated CHADSVASC of at least 6 with a 10% stroke risk annually. She remains on anticoagulation. No longer on amiodarone and have elected to manage with rate control and continued anticoagulation - CCB added at last visit - HR is good on her current regimen.   2. Chronic anticoagulation with Pradaxa - no problems noted. CBC today.   2. LV hypertrophy with hyperdynamic LV - last echo done at [facility] noted - managed conservatively. No real symptoms.   4. HLD - no longer on statin therapy - would not plan on restarting.   5. Resting Bradycardia - in the setting of NSR - now in chronic AF today. Have held off on PPM and tried to manage with rate control and continued anticoagulation. Currently not an issue at this time. Will need to follow closely.   Current medicines are reviewed with the patient today.  The patient does not have concerns regarding medicines other than what has been noted above.  The following changes have been made:  See above.  Labs/ tests ordered today include:    Orders Placed This Encounter  Procedures  . Basic metabolic panel  . CBC     Disposition:   FU with me in 6 months. Wished her a happy birthday today.    Patient is agreeable to this plan and will call if any problems develop in the interim.   SignedTruitt Merle, NP  06/30/2016 1:47 PM  [facility] [address] [place], [place]  [address] Phone: [phone] Fax: [phone]

## 2016-07-01 LAB — BASIC METABOLIC PANEL
BUN/Creatinine Ratio: 20 (ref 12–28)
BUN: 28 mg/dL — ABNORMAL HIGH (ref 8–27)
CO2: 26 mmol/L (ref 18–29)
Calcium: 10.7 mg/dL — ABNORMAL HIGH (ref 8.7–10.3)
Chloride: 103 mmol/L (ref 96–106)
Creatinine, Ser: 1.42 mg/dL — ABNORMAL HIGH (ref 0.57–1.00)
GFR calc Af Amer: 38 mL/min/{1.73_m2} — ABNORMAL LOW (ref >59)
GFR calc non Af Amer: 33 mL/min/{1.73_m2} — ABNORMAL LOW (ref >59)
Glucose: 148 mg/dL — ABNORMAL HIGH (ref 65–99)
Potassium: 4.7 mmol/L (ref 3.5–5.2)
Sodium: 143 mmol/L (ref 134–144)

## 2016-07-01 LAB — CBC
Hematocrit: 43 % (ref 34.0–46.6)
Hemoglobin: 14.5 g/dL (ref 11.1–15.9)
MCH: 30.9 pg (ref 26.6–33.0)
MCHC: 33.7 g/dL (ref 31.5–35.7)
MCV: 92 fL (ref 79–97)
Platelets: 198 10*3/uL (ref 150–379)
RBC: 4.7 x10E6/uL (ref 3.77–5.28)
RDW: 14.2 % (ref 12.3–15.4)
WBC: 6.6 10*3/uL (ref 3.4–10.8)

## 2016-07-03 DIAGNOSIS — I480 Paroxysmal atrial fibrillation: Secondary | ICD-10-CM | POA: Diagnosis not present

## 2016-07-03 DIAGNOSIS — I10 Essential (primary) hypertension: Secondary | ICD-10-CM | POA: Diagnosis not present

## 2016-07-03 DIAGNOSIS — M810 Age-related osteoporosis without current pathological fracture: Secondary | ICD-10-CM | POA: Diagnosis not present

## 2016-07-03 DIAGNOSIS — N183 Chronic kidney disease, stage 3 (moderate): Secondary | ICD-10-CM | POA: Diagnosis not present

## 2016-07-20 DIAGNOSIS — L578 Other skin changes due to chronic exposure to nonionizing radiation: Secondary | ICD-10-CM | POA: Diagnosis not present

## 2016-07-20 DIAGNOSIS — D0471 Carcinoma in situ of skin of right lower limb, including hip: Secondary | ICD-10-CM | POA: Diagnosis not present

## 2016-07-20 DIAGNOSIS — L821 Other seborrheic keratosis: Secondary | ICD-10-CM | POA: Diagnosis not present

## 2016-07-31 DIAGNOSIS — L03111 Cellulitis of right axilla: Secondary | ICD-10-CM | POA: Diagnosis not present

## 2016-07-31 DIAGNOSIS — I480 Paroxysmal atrial fibrillation: Secondary | ICD-10-CM | POA: Diagnosis not present

## 2016-07-31 DIAGNOSIS — E784 Other hyperlipidemia: Secondary | ICD-10-CM | POA: Diagnosis not present

## 2016-07-31 DIAGNOSIS — L97919 Non-pressure chronic ulcer of unspecified part of right lower leg with unspecified severity: Secondary | ICD-10-CM | POA: Diagnosis not present

## 2016-08-07 DIAGNOSIS — L03111 Cellulitis of right axilla: Secondary | ICD-10-CM | POA: Diagnosis not present

## 2016-08-07 DIAGNOSIS — M1711 Unilateral primary osteoarthritis, right knee: Secondary | ICD-10-CM | POA: Diagnosis not present

## 2016-08-07 DIAGNOSIS — L97919 Non-pressure chronic ulcer of unspecified part of right lower leg with unspecified severity: Secondary | ICD-10-CM | POA: Diagnosis not present

## 2016-08-24 DIAGNOSIS — L299 Pruritus, unspecified: Secondary | ICD-10-CM | POA: Diagnosis not present

## 2016-08-24 DIAGNOSIS — L300 Nummular dermatitis: Secondary | ICD-10-CM | POA: Diagnosis not present

## 2016-09-14 ENCOUNTER — Encounter: Payer: Self-pay | Admitting: Nurse Practitioner

## 2016-09-14 ENCOUNTER — Other Ambulatory Visit: Payer: Self-pay | Admitting: *Deleted

## 2016-09-14 MED ORDER — LOSARTAN POTASSIUM 100 MG PO TABS: 100.0000 mg | ORAL_TABLET | Freq: Every day | ORAL | 9 refills | Status: DC

## 2016-09-14 NOTE — Telephone Encounter (Signed)
S/w pt's daughter per [facility]) is aware of valsartan being recalled pt is switched to (100 mg ) daily of losartan, sent in today to pt's requested pharmacy.  Will keep an eye on bp's and call if start trending upward, otherwise see pt back in November.

## 2016-10-16 DIAGNOSIS — M810 Age-related osteoporosis without current pathological fracture: Secondary | ICD-10-CM | POA: Diagnosis not present

## 2016-10-16 DIAGNOSIS — N183 Chronic kidney disease, stage 3 (moderate): Secondary | ICD-10-CM | POA: Diagnosis not present

## 2016-10-16 DIAGNOSIS — G3184 Mild cognitive impairment, so stated: Secondary | ICD-10-CM | POA: Diagnosis not present

## 2016-10-16 DIAGNOSIS — I480 Paroxysmal atrial fibrillation: Secondary | ICD-10-CM | POA: Diagnosis not present

## 2016-10-26 ENCOUNTER — Encounter: Payer: Self-pay | Admitting: Nurse Practitioner

## 2016-10-27 MED ORDER — LOSARTAN POTASSIUM 100 MG PO TABS: 100.0000 mg | ORAL_TABLET | Freq: Every day | ORAL | 1 refills | Status: DC

## 2016-11-12 DIAGNOSIS — Z23 Encounter for immunization: Secondary | ICD-10-CM | POA: Diagnosis not present

## 2016-11-27 DIAGNOSIS — H353132 Nonexudative age-related macular degeneration, bilateral, intermediate dry stage: Secondary | ICD-10-CM | POA: Diagnosis not present

## 2016-12-07 DIAGNOSIS — S81801A Unspecified open wound, right lower leg, initial encounter: Secondary | ICD-10-CM | POA: Diagnosis not present

## 2016-12-07 DIAGNOSIS — Z23 Encounter for immunization: Secondary | ICD-10-CM | POA: Diagnosis not present

## 2016-12-07 DIAGNOSIS — L03115 Cellulitis of right lower limb: Secondary | ICD-10-CM | POA: Diagnosis not present

## 2016-12-08 DIAGNOSIS — L03115 Cellulitis of right lower limb: Secondary | ICD-10-CM | POA: Diagnosis not present

## 2016-12-08 DIAGNOSIS — I7389 Other specified peripheral vascular diseases: Secondary | ICD-10-CM | POA: Diagnosis not present

## 2016-12-08 DIAGNOSIS — S81801A Unspecified open wound, right lower leg, initial encounter: Secondary | ICD-10-CM | POA: Diagnosis not present

## 2016-12-14 DIAGNOSIS — S81801A Unspecified open wound, right lower leg, initial encounter: Secondary | ICD-10-CM | POA: Diagnosis not present

## 2016-12-21 DIAGNOSIS — S81801D Unspecified open wound, right lower leg, subsequent encounter: Secondary | ICD-10-CM | POA: Diagnosis not present

## 2016-12-25 DIAGNOSIS — H6123 Impacted cerumen, bilateral: Secondary | ICD-10-CM | POA: Diagnosis not present

## 2016-12-28 DIAGNOSIS — S81802D Unspecified open wound, left lower leg, subsequent encounter: Secondary | ICD-10-CM | POA: Diagnosis not present

## 2016-12-29 ENCOUNTER — Other Ambulatory Visit: Payer: Self-pay | Admitting: Nurse Practitioner

## 2016-12-29 ENCOUNTER — Ambulatory Visit (INDEPENDENT_AMBULATORY_CARE_PROVIDER_SITE_OTHER): Payer: Medicare Other | Admitting: Nurse Practitioner

## 2016-12-29 ENCOUNTER — Encounter: Payer: Self-pay | Admitting: Nurse Practitioner

## 2016-12-29 VITALS — BP 128/56 | HR 75 | Ht 60.0 in | Wt 104.8 lb

## 2016-12-29 DIAGNOSIS — Z7901 Long term (current) use of anticoagulants: Secondary | ICD-10-CM | POA: Diagnosis not present

## 2016-12-29 DIAGNOSIS — I481 Persistent atrial fibrillation: Secondary | ICD-10-CM

## 2016-12-29 DIAGNOSIS — I4819 Other persistent atrial fibrillation: Secondary | ICD-10-CM

## 2016-12-29 DIAGNOSIS — Z79899 Other long term (current) drug therapy: Secondary | ICD-10-CM | POA: Diagnosis not present

## 2016-12-29 NOTE — Patient Instructions (Addendum)
We will be checking the following labs today - BMET, CBC, HPF, TSH   Medication Instructions:    Continue with your current medicines.     Testing/Procedures To Be Arranged:  N/A  Follow-Up:   Truitt Merle, NP 06/28/17 @ 2:30 PM   Other Special Instructions:   Ice cream!    If you need a refill on your cardiac medications before your next appointment, please call your pharmacy.   Call the [place] office at [phone] if you have any questions, problems or concerns.

## 2016-12-29 NOTE — Progress Notes (Signed)
CARDIOLOGY OFFICE NOTE  Date:  12/29/2016    Melanie Whitehead Date of Birth: Jan 03, 1925 Medical Record #[id number]  PCP:  System, Provider Not In  Cardiologist:  Servando Snare & Allred    Chief Complaint  Patient presents with  . Atrial Fibrillation    Follow up visit - seen for Dr. Rayann Heman    History of Present Illness: Melanie Whitehead is a 83 y.o. female who presents today for a 6 month check. Seen for Dr. Rayann Heman.   She has a history of paroxysmal atrial fibrillation, bilateral subclavian artery stenosis, mitral regurgitation, HTN, HL, GERD, and PVD.   She was seen in March of 2014 after an admission to the hospital with recurrent atrial fibrillation. This was complicated by acute on chronic diastolic CHF. She was placed on amiodarone. She underwent cardioversion. She was seen by Dr. Rayann Heman 05/10/13. She remained in sinus rhythm. Her ECG demonstrated 1:1 versus 2:1 AV conduction (difficult to completely rule out 2:1 AV block and therefore, diltiazem was stopped and her amiodarone dose was decreased.   Seen by Richardson Dopp, PA back in April of 2014 - seemed to be doing ok but with some atypical CP - Myoview updated and this turned out ok.   Since then, she had to have her dose of amiodarone cut back due to bradycardia. Trying to avoid PPM if possible.   Was here in February of 2017 and saw Ermalinda Barrios, [place] - had moved to SNF. HR noted to be elevated by the staff. She was brought back here and noted to be back in AF. Amiodarone was increased for a short period with plans for possible cardioversion - this was successful. She had missed one dose of Pradaxa - almost 3 weeks prior - one dose only. It is felt that she would not tolerate long term AF due to prior experience.   I saw her back in July of 2017 - cardiac status was ok but had had some serious health issues (flu which led to a fall which led to rhabdo).  I received emails back in September - had had some eye surgery -  noted to be in AF - was not symptomatic. Offered to see back sooner but the patient and family opted to wait until her follow up in November - at that visit she was totally asymptomatic. We ended up stopping the amiodarone and elected for rate control only.   Received MyChart message since then with noted elevated HR - Metoprolol was increased with good results and we have subsequently added back low dose CCB for rate control/palpitations. At last visit back in May she was doing well.    Comes in today. Here with her daughter [name] today. Spirits are pretty down today - her husband has been placed in memory care - they have been together for over 67 years - this has been pretty hard for her. No chest pain. Breathing is ok as long as she paces herself. She has lost weight - not eating like she was. Overall, she feels like she is doing ok. Tolerating her Pradaxa without issues. She does have a wound on the lower right leg from trauma - seeing the wound clinic weekly - does have some swelling but it is improving.   Past Medical History:  Diagnosis Date  . Atrial fibrillation ([place])   . BUNDLE BRANCH BLOCK, LEFT 07/09/2006  . CAD (coronary artery disease)    a. 2011 Cath: minimal, nonobstructive dzs.  Marland Kitchen  CAROTID BRUIT, LEFT 07/08/2007  . CHF (congestive heart failure) ([place])   . COLONIC POLYPS, HX OF 07/09/2006  . DEGENERATIVE JOINT DISEASE 07/09/2006  . Esophageal reflux   . GERD 07/13/2008  . Hx of cardiovascular stress test 2015   Lexiscan Myoview (05/2013):  No ischemia, EF 58%, normal study  . Hx of colonoscopy   . HYPERLIPIDEMIA 07/09/2006  . HYPERTENSION 07/09/2006  . Labial cyst   . Left ventricular outflow tract obstruction    a. due to dynamic obstruction with LVH  . LVH (left ventricular hypertrophy)   . Moderate mitral regurgitation    a. 12/2010 Echo: EF 65-70%, mild AI, mod MR, mod-sev dil LA, PASP 76mmHg.  . Osteopenia   . OSTEOPOROSIS 07/09/2006  . PAROXYSMAL ATRIAL FIBRILLATION     a. Dx 09/2009;  b. chronic pradaxa;  c. recurrence 04/2013->amiodarone initiated.  Marland Kitchen PVD (peripheral vascular disease) ([facility])    a. moderate R and severe L subclavian artery stenosis  . Restless leg syndrome   . SCC (squamous cell carcinoma)     Past Surgical History:  Procedure Laterality Date  . 2-d echo  12/2005   revealed normal LV function and size.  No wall motion at around his  moderate mitral annular calcification.  Elevated LVOT gradient of 22 thought secondary to narrow outflow  tract aortic valve appeared to open well  . CARDIOVERSION N/A 05/05/2013   Procedure: CARDIOVERSION;  Surgeon: [name], MD;  Location: [facility] ENDOSCOPY;  Service: Cardiovascular;  Laterality: N/A;  . CARDIOVERSION N/A 04/08/2015   Procedure: CARDIOVERSION;  Surgeon: Fay Records, MD;  Location: [place];  Service: Cardiovascular;  Laterality: N/A;  . CATARACT EXTRACTION Bilateral   . GLAUCOMA SURGERY    . low risk cardiolite  12/2005  . PARS PLANA VITRECTOMY W/ REPAIR OF MACULAR HOLE    . TONSILLECTOMY    . two para two     abortus zero     Medications: Current Meds  Medication Sig  . Biotin ([employer] BIOTIN HIGH POTENCY) 1000 MCG tablet Take 1,000 mcg by mouth daily.  . Calcium Carb-Cholecalciferol 600-400 MG-UNIT TABS Take 1 tablet by mouth 2 (two) times daily.   . dabigatran (PRADAXA) 75 MG CAPS capsule Take 75 mg by mouth 2 (two) times daily.  Marland Kitchen denosumab (PROLIA) 60 MG/ML SOLN injection Inject 60 mg into the skin every 6 (six) months. Administer in upper arm, thigh, or abdomen  . losartan (COZAAR) 100 MG tablet Take 1 tablet (100 mg total) by mouth daily.  . Lutein 6 MG CAPS Take by mouth.  . metoprolol succinate (TOPROL-XL) 25 MG 24 hr tablet Take 1 tablet (25 mg total) by mouth daily.  . Multiple Vitamin (MULTIVITAMIN) tablet Take 1 tablet by mouth daily.    . nitroGLYCERIN (NITRODUR - DOSED IN MG/24 HR) 0.2 mg/hr patch Place 0.2 mg onto the skin daily. Take patch off after 12 hours (ONLY  USE 1 PATCH A DAY)  . nitroGLYCERIN (NITROSTAT) 0.4 MG SL tablet Place 0.4 mg under the tongue every 5 (five) minutes as needed for chest pain.  . prednisoLONE acetate (PRED FORTE) 1 % ophthalmic suspension Place 1 drop into the right eye 4 (four) times daily.   Marland Kitchen rOPINIRole (REQUIP) 1 MG tablet Take 1 mg by mouth at bedtime.     Allergies: Allergies  Allergen Reactions  . Sulfa Antibiotics Nausea And Vomiting and Other (See Comments)  . Sulfamethoxazole     REACTION: unspecified UNKNOWN    Social  History: The patient  reports that  has never smoked. she has never used smokeless tobacco. She reports that she does not drink alcohol or use drugs.   Family History: The patient's family history includes COPD in her unknown relative; Colon cancer in her unknown relative; Coronary artery disease in her mother; Diabetes in her unknown relative; Heart attack in her mother; Lung cancer in her unknown relative; Stroke in her father.   Review of Systems: Please see the history of present illness.   Otherwise, the review of systems is positive for none.   All other systems are reviewed and negative.   Physical Exam: VS:  BP (!) 128/56   Pulse 75   Ht 5' (1.524 m)   Wt 104 lb 12.8 oz (47.5 kg)   BMI 20.47 kg/m  .  BMI Body mass index is 20.47 kg/m.  Wt Readings from Last 3 Encounters:  12/29/16 104 lb 12.8 oz (47.5 kg)  06/30/16 117 lb 12.8 oz (53.4 kg)  01/20/16 121 lb 12.8 oz (55.2 kg)    General: Pleasant. Elderly female. Alert but she is depressed today - she is in no acute distress.   HEENT: Normal.  Neck: Supple, no JVD, carotid bruits, or masses noted.  Cardiac: Irregular irregular rhythm. Rate is ok today. Outflow murmur. No edema. Dressing on the right leg looks fine.  Respiratory:  Lungs are clear to auscultation bilaterally with normal work of breathing.  GI: Soft and nontender.  MS: No deformity or atrophy. Gait and ROM intact.  Skin: Warm and dry. Color is normal.    Neuro:  Strength and sensation are intact and no gross focal deficits noted.  Psych: Alert, appropriate and with normal affect.   LABORATORY DATA:  EKG:  EKG is ordered today. This demonstrates chronic AF with controlled VR with LBBB.  Lab Results  Component Value Date   WBC 6.6 06/30/2016   HGB 14.5 06/30/2016   HCT 43.0 06/30/2016   PLT 198 06/30/2016   GLUCOSE 148 (H) 06/30/2016   CHOL 152 06/24/2011   TRIG 155.0 (H) 06/24/2011   HDL 42.50 06/24/2011   LDLDIRECT 101.7 07/15/2009   LDLCALC 79 06/24/2011   ALT 12 12/16/2015   AST 17 12/16/2015   NA 143 06/30/2016   K 4.7 06/30/2016   CL 103 06/30/2016   CREATININE 1.42 (H) 06/30/2016   BUN 28 (H) 06/30/2016   CO2 26 06/30/2016   TSH 2.66 12/16/2015   INR 1.62 (H) 04/01/2015     BNP (last 3 results) No results for input(s): BNP in the last 8760 hours.  ProBNP (last 3 results) No results for input(s): PROBNP in the last 8760 hours.   Other Studies Reviewed Today:  TTE procedure: ECHOCARDIOGRAM W COLORFLOW SPECTRAL DOPPLER. Procedure date Date: 03/22/2017Start: 08:52 AM Height: 59 inches Weight: 127 pounds BSA: 1.52 m2 BMI: 25.65 kg/m2 HR: 80 bpm BP: 167/66 mmHg Conclusions Summary MIld to moderate LVH Normal left ventricle size and systolic function, with no segmental abnormality. Moderate mitral annular calcification with mild gradient. Mild eccentric mitral regurgitation noted. Moderately dilated left atrium. Mild Aortic regurgitation. Signature ------------------------------------------------------------------------------ Electronically signed by Rozann Lesches, MD(Interpreting physician) on 05/01/2015 10:12 AM   Myoview Impression from April 2015 Exercise Capacity: [place] with no exercise. BP Response: Normal blood pressure response. Clinical Symptoms: No significant symptoms noted. ECG Impression: No significant ST segment change suggestive of  ischemia. Comparison with Prior Nuclear Study:  No significant change from  previous study  Overall Impression: Normal stress  nuclear study.  LV Wall Motion: NL LV Function; NL Wall Motion   Lorretta Harp, MD  Assessment / Plan:  1. Persistent AF with an elevated CHADSVASC of at least 6 with a 10% stroke risk annually. She continues to be managed with rate control and her chronic anticoagulation - no problems noted. HR looks good today. No changes made today. She seems to be in a general decline to me.   2. Chronic anticoagulation with Pradaxa - no problems noted. Not bleeding. No excessive bruising. CBC today.   3. LV hypertrophy with hyperdynamic LV - last echo done at [facility] noted - managed conservatively. No real symptoms.   4. HLD - no longer on statin therapy - would not plan on restarting.   5. Resting Bradycardia - in the setting of NSR - now in chronic AF today. Have held off on PPM and tried to manage with rate control and continued anticoagulation. Her HR is now satisfactory.   6. Weight loss - most likely stress related. Does need lab today. Encouraged nutrition.   Current medicines are reviewed with the patient today.  The patient does not have concerns regarding medicines other than what has been noted above.  The following changes have been made:  See above.  Labs/ tests ordered today include:   No orders of the defined types were placed in this encounter.    Disposition:   FU with me in 6 months.   Patient is agreeable to this plan and will call if any problems develop in the interim.   SignedTruitt Merle, NP  12/29/2016 1:57 PM  [facility] [address] [place], [place]  [address] Phone: [phone] Fax: [phone]

## 2016-12-30 LAB — CBC
Hematocrit: 40 % (ref 34.0–46.6)
Hemoglobin: 13 g/dL (ref 11.1–15.9)
MCH: 31.5 pg (ref 26.6–33.0)
MCHC: 32.5 g/dL (ref 31.5–35.7)
MCV: 97 fL (ref 79–97)
Platelets: 248 10*3/uL (ref 150–379)
RBC: 4.13 x10E6/uL (ref 3.77–5.28)
RDW: 13.6 % (ref 12.3–15.4)
WBC: 7.5 10*3/uL (ref 3.4–10.8)

## 2016-12-30 LAB — HEPATIC FUNCTION PANEL
ALT: 17 IU/L (ref 0–32)
AST: 22 IU/L (ref 0–40)
Albumin: 3.3 g/dL (ref 3.2–4.6)
Alkaline Phosphatase: 71 IU/L (ref 39–117)
Bilirubin Total: 0.4 mg/dL (ref 0.0–1.2)
Bilirubin, Direct: 0.12 mg/dL (ref 0.00–0.40)
Total Protein: 5.9 g/dL — ABNORMAL LOW (ref 6.0–8.5)

## 2016-12-30 LAB — BASIC METABOLIC PANEL
BUN/Creatinine Ratio: 19 (ref 12–28)
BUN: 27 mg/dL (ref 10–36)
CO2: 23 mmol/L (ref 20–29)
Calcium: 10.9 mg/dL — ABNORMAL HIGH (ref 8.7–10.3)
Chloride: 106 mmol/L (ref 96–106)
Creatinine, Ser: 1.43 mg/dL — ABNORMAL HIGH (ref 0.57–1.00)
GFR calc Af Amer: 37 mL/min/{1.73_m2} — ABNORMAL LOW (ref >59)
GFR calc non Af Amer: 32 mL/min/{1.73_m2} — ABNORMAL LOW (ref >59)
Glucose: 155 mg/dL — ABNORMAL HIGH (ref 65–99)
Potassium: 4.1 mmol/L (ref 3.5–5.2)
Sodium: 145 mmol/L — ABNORMAL HIGH (ref 134–144)

## 2016-12-30 LAB — TSH: TSH: 1.1 u[IU]/mL (ref 0.450–4.500)

## 2017-01-05 DIAGNOSIS — L97219 Non-pressure chronic ulcer of right calf with unspecified severity: Secondary | ICD-10-CM | POA: Diagnosis not present

## 2017-01-05 DIAGNOSIS — S81801S Unspecified open wound, right lower leg, sequela: Secondary | ICD-10-CM | POA: Diagnosis not present

## 2017-01-15 DIAGNOSIS — M810 Age-related osteoporosis without current pathological fracture: Secondary | ICD-10-CM | POA: Diagnosis not present

## 2017-01-15 DIAGNOSIS — H6123 Impacted cerumen, bilateral: Secondary | ICD-10-CM | POA: Diagnosis not present

## 2017-03-08 ENCOUNTER — Encounter (INDEPENDENT_AMBULATORY_CARE_PROVIDER_SITE_OTHER): Payer: Self-pay

## 2017-03-09 ENCOUNTER — Other Ambulatory Visit: Payer: Self-pay | Admitting: Nurse Practitioner

## 2017-03-09 MED ORDER — DILTIAZEM HCL ER COATED BEADS 120 MG PO CP24: 120.0000 mg | ORAL_CAPSULE | Freq: Every day | ORAL | 3 refills | Status: DC

## 2017-03-09 NOTE — Telephone Encounter (Signed)
Patients medication was sent to patients pharmacy as requested. Conformation received.

## 2017-04-12 NOTE — Telephone Encounter (Signed)
I have called and spoken to Melanie Whitehead. She now has mixed feelings about changing the medicines. Over the past week or so, things have gone better with the current plan of administration in place.   We have agreed to stay on the current regimen for now but we can change to Xarelto and Imdur if needed.   Burtis Junes, RN, [facility] [address] [place], [place]  [address] [phone]

## 2017-05-17 IMAGING — US US RENAL
1 series · 14 of 25 positions shown · non-contrast
Comparison: None.

CLINICAL DATA: Chronic renal disease.

EXAM:
RENAL / URINARY TRACT ULTRASOUND COMPLETE

[Series 1: us renal · 0.30mm/px · 14 of 41 slices shown]
[im 1/41]
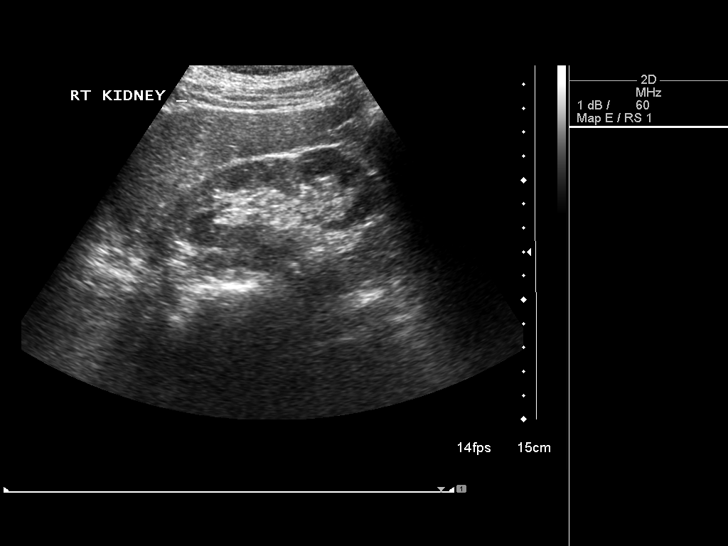
[im 4/41]
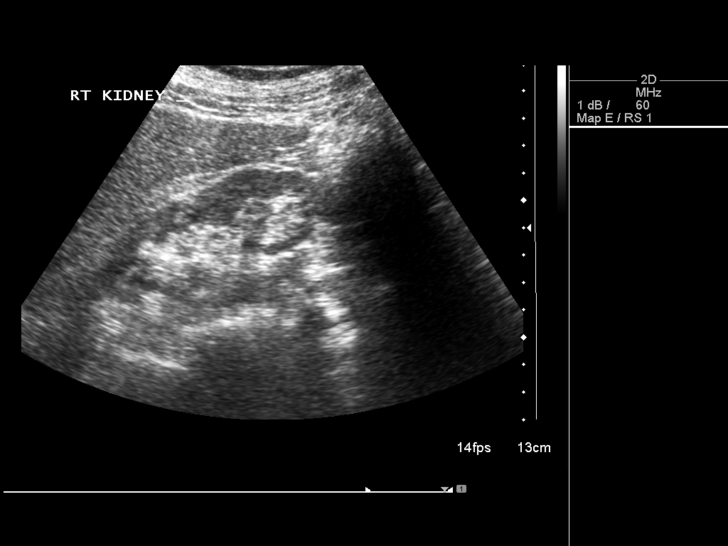
[im 7/41]
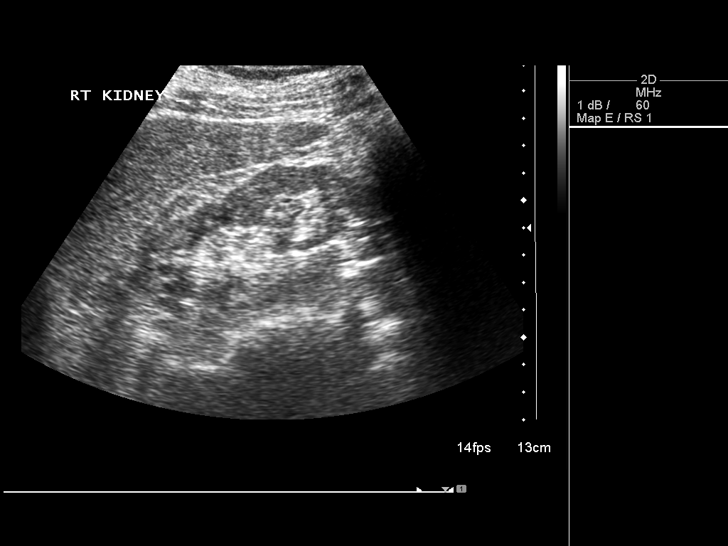
[im 11/41]
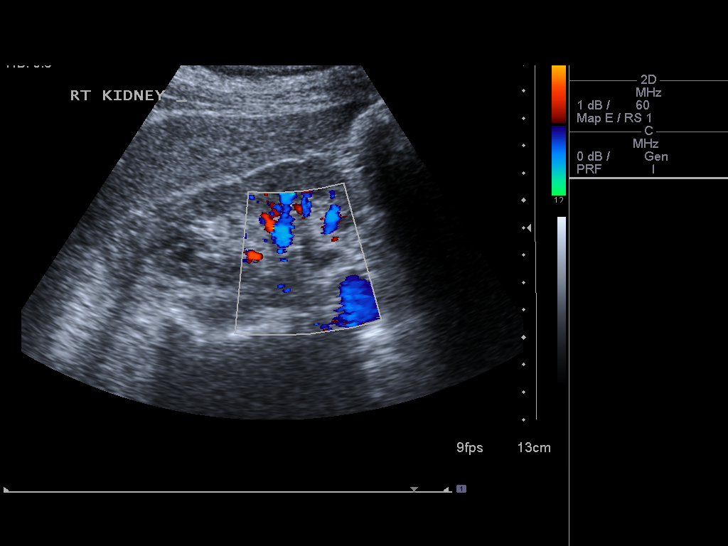
[im 14/41]
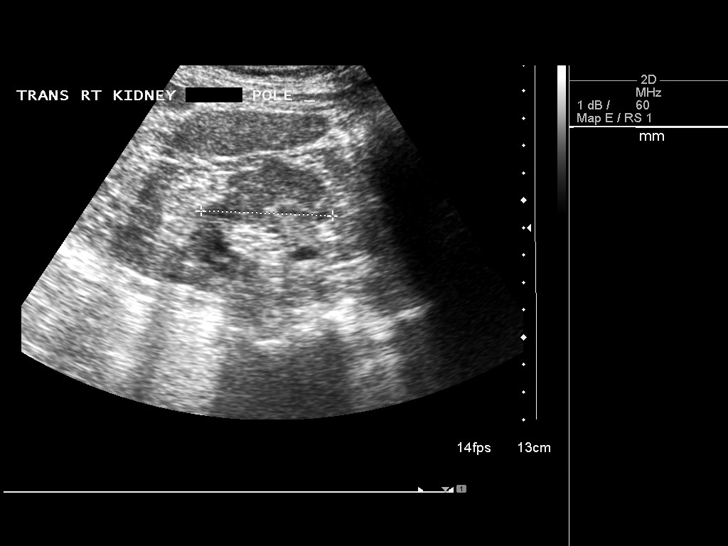
[im 16/41]
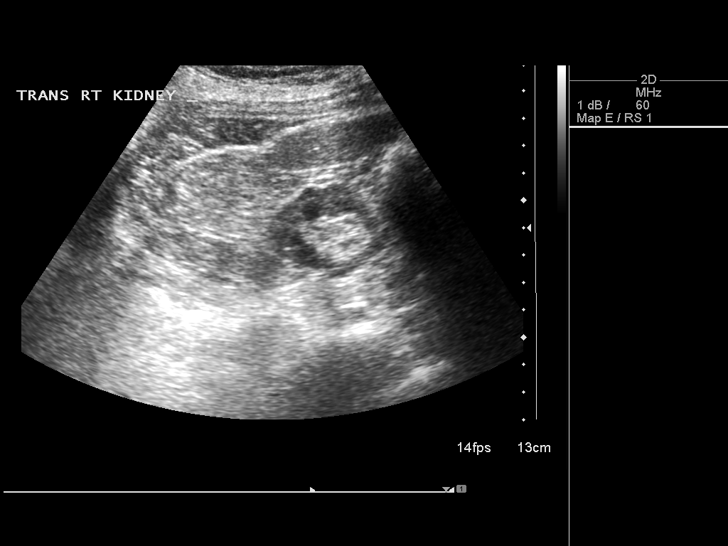
[im 19/41]
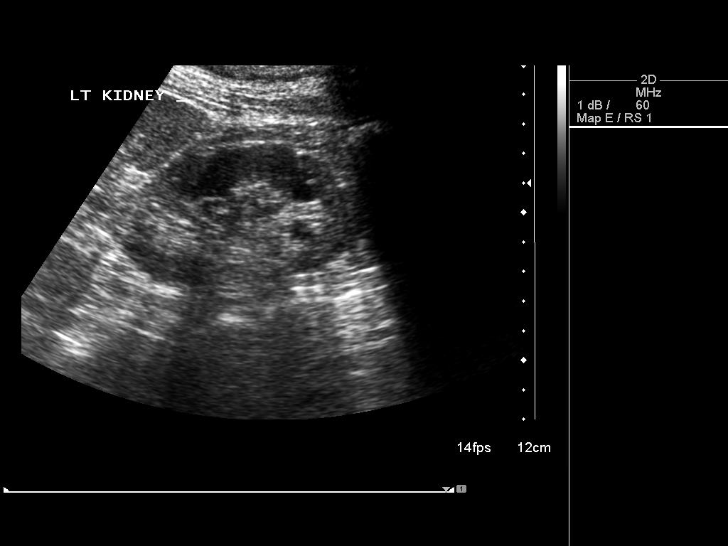
[im 22/41]
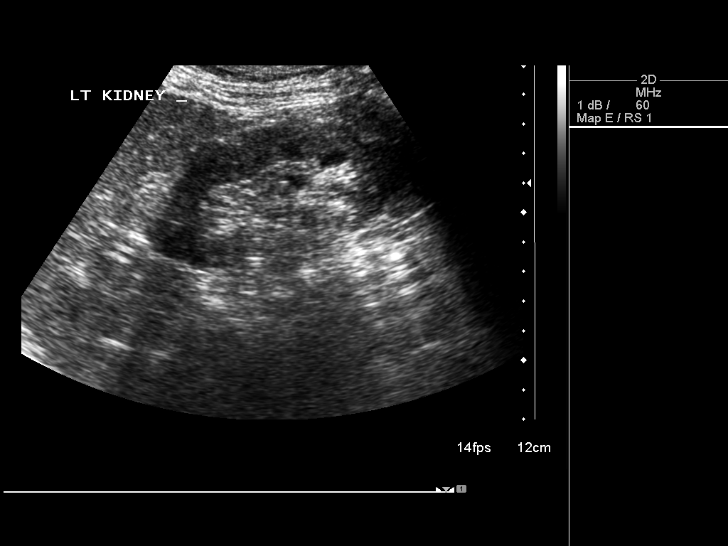
[im 26/41]
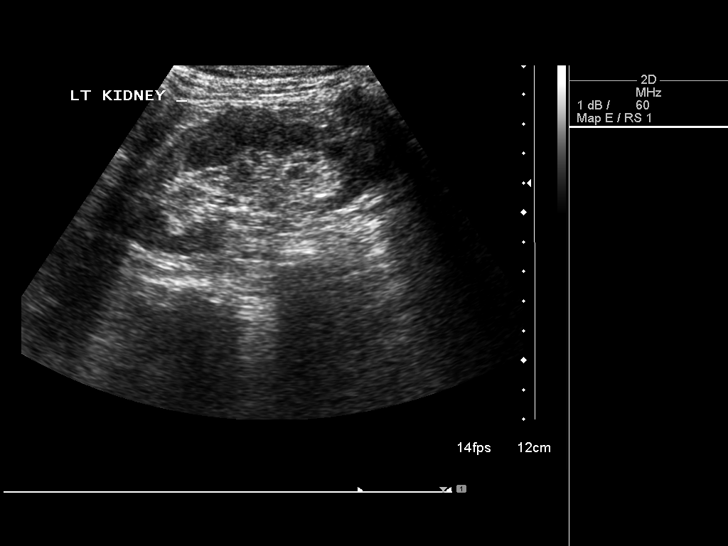
[im 27/41]
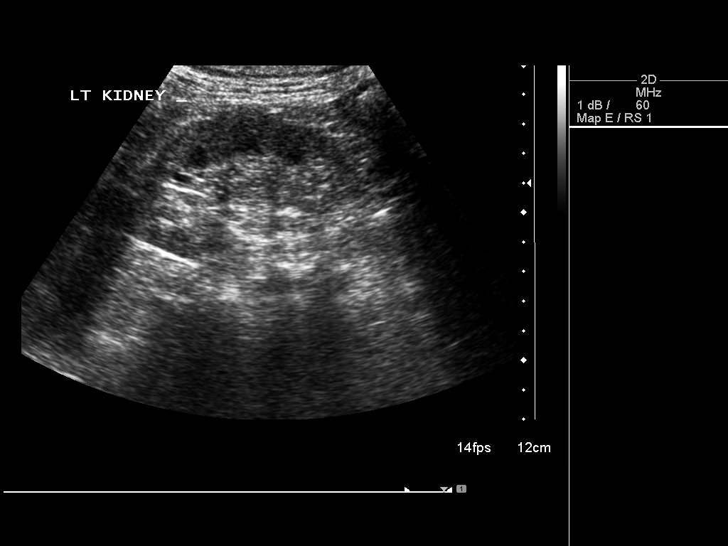
[im 31/41]
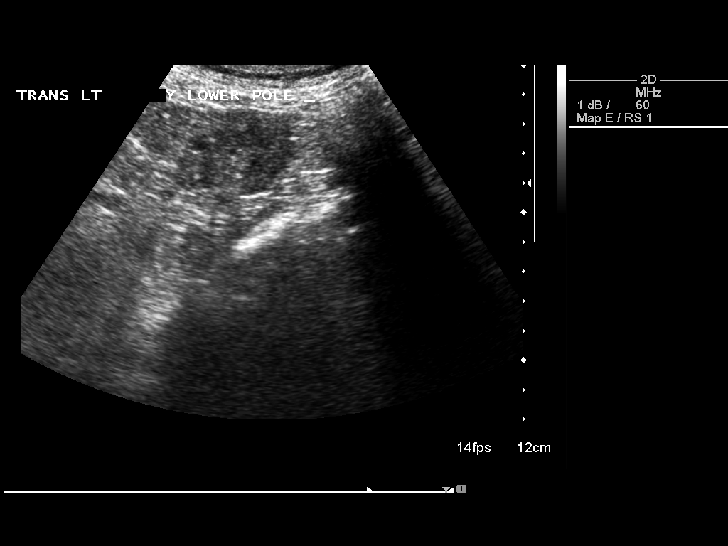
[im 34/41]
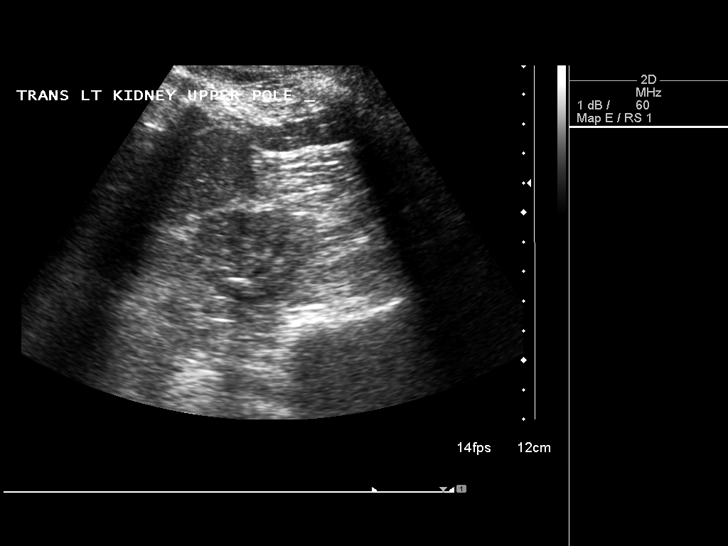
[im 37/41]
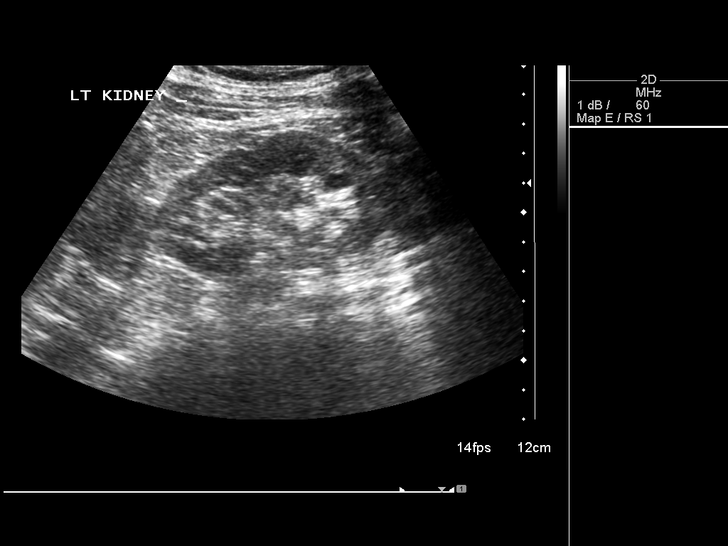
[im 41/41]
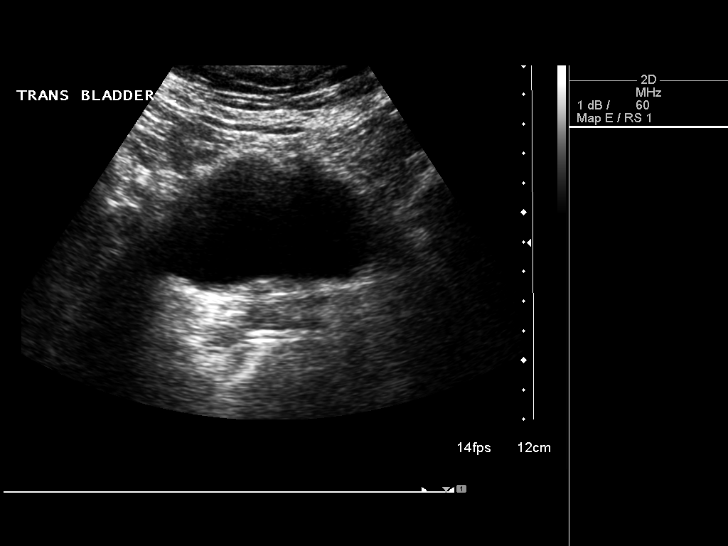

[14 of 25 positions shown; findings below may reference images not displayed]

FINDINGS: Right Kidney:

Length: 9.3 cm. Cortical thinning and irregularity. Echogenicity
within normal limits. No mass or hydronephrosis visualized.

Left Kidney:

Length: 8.7 cm. Cortical thinning and irregularity. Echogenicity
within normal limits. No mass or hydronephrosis visualized.

Bladder:

Appears normal for degree of bladder distention.
IMPRESSION: Bilateral renal cortical thinning and irregularity consistent with
atrophy and scarring. No hydronephrosis or bladder distention.

## 2017-06-28 ENCOUNTER — Encounter: Payer: Self-pay | Admitting: Nurse Practitioner

## 2017-06-28 ENCOUNTER — Ambulatory Visit (INDEPENDENT_AMBULATORY_CARE_PROVIDER_SITE_OTHER): Payer: Medicare Other | Admitting: Nurse Practitioner

## 2017-06-28 VITALS — BP 100/50 | HR 78 | Ht 60.0 in | Wt 104.8 lb

## 2017-06-28 DIAGNOSIS — Z7901 Long term (current) use of anticoagulants: Secondary | ICD-10-CM | POA: Diagnosis not present

## 2017-06-28 DIAGNOSIS — I481 Persistent atrial fibrillation: Secondary | ICD-10-CM | POA: Diagnosis not present

## 2017-06-28 DIAGNOSIS — I4819 Other persistent atrial fibrillation: Secondary | ICD-10-CM

## 2017-06-28 MED ORDER — LOSARTAN POTASSIUM 50 MG PO TABS: 50.0000 mg | ORAL_TABLET | Freq: Every day | ORAL | 3 refills | Status: AC

## 2017-06-28 NOTE — Patient Instructions (Addendum)
We will be checking the following labs today - BMET & CBC   Medication Instructions:    Continue with your current medicines. BUT  I am going to cut the Losartan back to just 50 mg a day.     Testing/Procedures To Be Arranged:  N/A  Follow-Up:   See me in 6 months    Other Special Instructions:   N/A    If you need a refill on your cardiac medications before your next appointment, please call your pharmacy.   Call the [place] office at [phone] if you have any questions, problems or concerns.

## 2017-06-28 NOTE — Progress Notes (Signed)
CARDIOLOGY OFFICE NOTE  Date:  06/28/2017    Melanie Whitehead Date of Birth: 02-14-1924 Medical Record #[id number]  PCP:  System, Provider Not In  Cardiologist:  [facility]    Chief Complaint  Patient presents with  . Atrial Fibrillation  . Congestive Heart Failure    Follow up visit - seen for Dr. Rayann Whitehead     History of Present Illness: Melanie Whitehead is a 84 y.o. female who presents today for a 6 month check. Seen for Dr. Rayann Whitehead.   She has a history of paroxysmal atrial fibrillation, bilateral subclavian artery stenosis, mitral regurgitation, HTN, HL, GERD, and PVD.   She was seen in March of 2014 after an admission to the hospital with recurrent atrial fibrillation. This was complicated by acute on chronic diastolic CHF. She was placed on amiodarone. She underwent cardioversion. She was seen by Dr. Rayann Whitehead 05/10/13. She remained in sinus rhythm. Her ECG demonstrated 1:1 versus 2:1 AV conduction(difficult to completely rule out 2:1 AV block and therefore, diltiazem was stopped and her amiodarone dose was decreased.   Seen by Richardson Dopp, PA back in April of 2014 - seemed to be doing ok but with some atypical CP - Myoview updated and this turned out ok.   Since then, she had to have her dose of amiodarone cut back due to bradycardia. Trying to avoid PPM if possible.   Was here in [place] saw Ermalinda Barrios, PA - had moved to SNF. HR noted to be elevated by the staff. She was brought back here and noted to be back in AF. Amiodarone was increased for a short period with plans for possible cardioversion - this was successful. She had missed one dose of Pradaxa - almost 3 weeks prior - one dose only. It is felt that she would not tolerate long term AF due to prior experience.   I saw her back in [place] 2017- cardiac status was ok but had had some serious health issues (flu which led to a fall which led to rhabdo).  I received emails back in September of  2017 - had had some eye surgery - noted to be in AF -wasnotsymptomatic. Offered to see back sooner but the patient and familyopted to wait until her regular follow up in November - at that visit she was totally asymptomatic. Weended up stoppingthe amiodarone and elected for rate control only. We have had to adjust her dose of beta blocker to achieve better HR control. Last seen in November - she was pretty depressed - husband had been placed in memory care - she was not eating and had lost weight.   Comes in today. Here with her daughter [name] today.Seems to be doing a bit better. She visits her husband most days but seems to be doing more activities with other women at the facility.  Appetite has picked up. Her weight however, is unchanged. No chest pain. Breathing is ok as long as she paces herself. No swelling. Not dizzy or lightheaded. BP pretty soft at home. Does not appear to be symptomatic. She feels like she is ok for the most part - turning 89 next week.   Past Medical History:  Diagnosis Date  . Atrial fibrillation ([place])   . BUNDLE BRANCH BLOCK, LEFT 07/09/2006  . CAD (coronary artery disease)    a. 2011 Cath: minimal, nonobstructive dzs.  . CAROTID BRUIT, LEFT 07/08/2007  . CHF (congestive heart failure) ([facility])   . COLONIC  POLYPS, HX OF 07/09/2006  . DEGENERATIVE JOINT DISEASE 07/09/2006  . Esophageal reflux   . GERD 07/13/2008  . Hx of cardiovascular stress test 2015   Lexiscan Myoview (05/2013):  No ischemia, EF 58%, normal study  . Hx of colonoscopy   . HYPERLIPIDEMIA 07/09/2006  . HYPERTENSION 07/09/2006  . Labial cyst   . Left ventricular outflow tract obstruction    a. due to dynamic obstruction with LVH  . LVH (left ventricular hypertrophy)   . Moderate mitral regurgitation    a. 12/2010 Echo: EF 65-70%, mild AI, mod MR, mod-sev dil LA, PASP 25mmHg.  . Osteopenia   . OSTEOPOROSIS 07/09/2006  . PAROXYSMAL ATRIAL FIBRILLATION    a. Dx 09/2009;  b. chronic pradaxa;  c.  recurrence 04/2013->amiodarone initiated.  Marland Kitchen PVD (peripheral vascular disease) ([facility])    a. moderate R and severe L subclavian artery stenosis  . Restless leg syndrome   . SCC (squamous cell carcinoma)     Past Surgical History:  Procedure Laterality Date  . 2-d echo  12/2005   revealed normal LV function and size.  No wall motion at around his  moderate mitral annular calcification.  Elevated LVOT gradient of 22 thought secondary to narrow outflow  tract aortic valve appeared to open well  . CARDIOVERSION N/A 05/05/2013   Procedure: CARDIOVERSION;  Surgeon: [name], MD;  Location: [facility] ENDOSCOPY;  Service: Cardiovascular;  Laterality: N/A;  . CARDIOVERSION N/A 04/08/2015   Procedure: CARDIOVERSION;  Surgeon: Fay Records, MD;  Location: [facility];  Service: Cardiovascular;  Laterality: N/A;  . CATARACT EXTRACTION Bilateral   . GLAUCOMA SURGERY    . low risk cardiolite  12/2005  . PARS PLANA VITRECTOMY W/ REPAIR OF MACULAR HOLE    . TONSILLECTOMY    . two para two     abortus zero     Medications: Current Meds  Medication Sig  . Biotin ([employer] BIOTIN HIGH POTENCY) 1000 MCG tablet Take 1,000 mcg by mouth daily.  . Calcium Carb-Cholecalciferol 600-400 MG-UNIT TABS Take 1 tablet by mouth 2 (two) times daily.   . dabigatran (PRADAXA) 75 MG CAPS capsule Take 75 mg by mouth 2 (two) times daily.  Marland Kitchen denosumab (PROLIA) 60 MG/ML SOLN injection Inject 60 mg into the skin every 6 (six) months. Administer in upper arm, thigh, or abdomen  . diltiazem (CARDIZEM CD) 120 MG 24 hr capsule Take 1 capsule (120 mg total) by mouth daily.  . Lutein 6 MG CAPS Take by mouth.  . metoprolol succinate (TOPROL-XL) 25 MG 24 hr tablet TAKE ONE TABLET BY MOUTH ONCE DAILY  . Multiple Vitamin (MULTIVITAMIN) tablet Take 1 tablet by mouth daily.    . nitroGLYCERIN (NITRODUR - DOSED IN MG/24 HR) 0.2 mg/hr patch Place 0.2 mg onto the skin daily. Take patch off after 12 hours (ONLY USE 1 PATCH A DAY)  .  nitroGLYCERIN (NITROSTAT) 0.4 MG SL tablet Place 0.4 mg under the tongue every 5 (five) minutes as needed for chest pain.  . prednisoLONE acetate (PRED FORTE) 1 % ophthalmic suspension Place 1 drop into the right eye 4 (four) times daily.   Marland Kitchen rOPINIRole (REQUIP) 1 MG tablet Take 1 mg by mouth at bedtime.  . [DISCONTINUED] losartan (COZAAR) 100 MG tablet Take 1 tablet (100 mg total) by mouth daily.     Allergies: Allergies  Allergen Reactions  . Aricept [Donepezil Hcl] Rash    Itchy rash on legs  . Sulfa Antibiotics Nausea And Vomiting and Other (  See Comments)  . Sulfamethoxazole     REACTION: unspecified UNKNOWN    Social History: The patient  reports that she has never smoked. She has never used smokeless tobacco. She reports that she does not drink alcohol or use drugs.   Family History: The patient's family history includes COPD in her unknown relative; Colon cancer in her unknown relative; Coronary artery disease in her mother; Diabetes in her unknown relative; Heart attack in her mother; Lung cancer in her unknown relative; Stroke in her father.   Review of Systems: Please see the history of present illness.   Otherwise, the review of systems is positive for none.   All other systems are reviewed and negative.   Physical Exam: VS:  BP (!) 100/50 (BP Location: Right Arm, Patient Position: Sitting, Cuff Size: Normal)   Pulse 78   Ht 5' (1.524 m)   Wt 104 lb 12.8 oz (47.5 kg)   SpO2 98% Comment: at rest  BMI 20.47 kg/m  .  BMI Body mass index is 20.47 kg/m.  Wt Readings from Last 3 Encounters:  06/28/17 104 lb 12.8 oz (47.5 kg)  12/29/16 104 lb 12.8 oz (47.5 kg)  06/30/16 117 lb 12.8 oz (53.4 kg)    General: Pleasant. Elderly female. Alert and in no acute distress.   HEENT: Normal.  Neck: Supple, no JVD, carotid bruits, or masses noted.  Cardiac: Irregular irregular rhythm. Rate is ok. Soft outflow murmur. No edema.  Respiratory:  Lungs are clear to auscultation  bilaterally with normal work of breathing.  GI: Soft and nontender.  MS: No deformity or atrophy. Gait and ROM intact.  Skin: Warm and dry. Color is normal.  Neuro:  Strength and sensation are intact and no gross focal deficits noted.  Psych: Alert, appropriate and with normal affect.   LABORATORY DATA:  EKG:  EKG is not ordered today.  Lab Results  Component Value Date   WBC 7.5 12/29/2016   HGB 13.0 12/29/2016   HCT 40.0 12/29/2016   PLT 248 12/29/2016   GLUCOSE 155 (H) 12/29/2016   CHOL 152 06/24/2011   TRIG 155.0 (H) 06/24/2011   HDL 42.50 06/24/2011   LDLDIRECT 101.7 07/15/2009   LDLCALC 79 06/24/2011   ALT 17 12/29/2016   AST 22 12/29/2016   NA 145 (H) 12/29/2016   K 4.1 12/29/2016   CL 106 12/29/2016   CREATININE 1.43 (H) 12/29/2016   BUN 27 12/29/2016   CO2 23 12/29/2016   TSH 1.100 12/29/2016   INR 1.62 (H) 04/01/2015       BNP (last 3 results) No results for input(s): BNP in the last 8760 hours.  ProBNP (last 3 results) No results for input(s): PROBNP in the last 8760 hours.   Other Studies Reviewed Today:  TTE procedure: ECHOCARDIOGRAM W COLORFLOW SPECTRAL DOPPLER. Procedure date Date: 03/22/2017Start: 08:52 AM Height: 59 inches Weight: 127 pounds BSA: 1.52 m2 BMI: 25.65 kg/m2 HR: 80 bpm BP: 167/66 mmHg Conclusions Summary MIld to moderate LVH Normal left ventricle size and systolic function, with no segmental abnormality. Moderate mitral annular calcification with mild gradient. Mild eccentric mitral regurgitation noted. Moderately dilated left atrium. Mild Aortic regurgitation. Signature ------------------------------------------------------------------------------ Electronically signed by Rozann Lesches, MD(Interpreting physician) on 05/01/2015 10:12 AM   Myoview Impression from April 2015 Exercise Capacity: [place] with no exercise. BP Response: Normal blood pressure response. Clinical Symptoms: No significant symptoms noted. ECG  Impression: No significant ST segment change suggestive of  ischemia. Comparison with Prior Nuclear Study: No  significant change from  previous study  Overall Impression: Normal stress nuclear study.  LV Wall Motion: NL LV Function; NL Wall Motion   Lorretta Harp, MD  Assessment / Plan:  1.Persistent AF with an elevatedCHADSVASCof at least6 with a 10% stroke risk annually. She is managed with rate control and remains on her anticoagulation. No changes made today.   2. Chronic anticoagulation with Pradaxa - no problems noted. Not bleeding. No excessive bruising. No falls noted. Checking surveillance lab today.   3. LV hypertrophy with hyperdynamic LV -[facility] noted - managed conservatively.No real symptoms.No changes made today - favor conservative management.   4. HLD - no longer on statin therapy - would not plan on restarting. No need to recheck.   5. Resting Bradycardia -in the setting ofNSR - now in chronic AF - HR is ok. No changes made today.   6. HTN - BP pretty soft here and at home - cutting Losartan back to just 50 mg a day.   7. Advanced age. Safety is primary goal.   Current medicines are reviewed with the patient today.  The patient does not have concerns regarding medicines other than what has been noted above.  The following changes have been made:  See above.  Labs/ tests ordered today include:    Orders Placed This Encounter  Procedures  . Basic metabolic panel  . CBC     Disposition:   FU with me in 6 months.   Patient is agreeable to this plan and will call if any problems develop in the interim.   SignedTruitt Merle, NP  06/28/2017 3:15 PM  [facility] [address] [place], [place]  [address] Phone: [phone] Fax: [phone]

## 2017-06-29 LAB — BASIC METABOLIC PANEL
BUN/Creatinine Ratio: 23 (ref 12–28)
BUN: 31 mg/dL (ref 10–36)
CO2: 24 mmol/L (ref 20–29)
Calcium: 10.4 mg/dL — ABNORMAL HIGH (ref 8.7–10.3)
Chloride: 103 mmol/L (ref 96–106)
Creatinine, Ser: 1.36 mg/dL — ABNORMAL HIGH (ref 0.57–1.00)
GFR calc Af Amer: 40 mL/min/{1.73_m2} — ABNORMAL LOW (ref >59)
GFR calc non Af Amer: 34 mL/min/{1.73_m2} — ABNORMAL LOW (ref >59)
Glucose: 138 mg/dL — ABNORMAL HIGH (ref 65–99)
Potassium: 4.6 mmol/L (ref 3.5–5.2)
Sodium: 141 mmol/L (ref 134–144)

## 2017-06-29 LAB — CBC
Hematocrit: 42.2 % (ref 34.0–46.6)
Hemoglobin: 14.4 g/dL (ref 11.1–15.9)
MCH: 32 pg (ref 26.6–33.0)
MCHC: 34.1 g/dL (ref 31.5–35.7)
MCV: 94 fL (ref 79–97)
Platelets: 214 10*3/uL (ref 150–450)
RBC: 4.5 x10E6/uL (ref 3.77–5.28)
RDW: 14.3 % (ref 12.3–15.4)
WBC: 6.1 10*3/uL (ref 3.4–10.8)

## 2017-12-11 ENCOUNTER — Other Ambulatory Visit: Payer: Self-pay | Admitting: Nurse Practitioner

## 2017-12-27 ENCOUNTER — Ambulatory Visit: Payer: Medicare Other | Admitting: Nurse Practitioner

## 2018-01-03 NOTE — Telephone Encounter (Signed)
I have spoken to [place]. For now, we will continue the Pradaxa. She is not having any falls. She is in a controlled environment. She will not be returning home. I worry that if we stop her anticoagulation, she would have a stroke that may not be fatal - that in fact would make her more debilitated. Certainly if she begins falling/bleeding, etc, we will need to stop. We have agreed to continue the Pradaxa for now.   Burtis Junes, RN, [facility] [address] [place], [place]  [address] [phone]

## 2018-01-17 ENCOUNTER — Ambulatory Visit: Payer: Medicare Other | Admitting: Nurse Practitioner

## 2020-04-09 DEATH — deceased
